# Patient Record
Sex: Male | Born: 1935 | Race: White | Hispanic: No | Marital: Married | Smoking: Former smoker
Health system: Southern US, Community
[De-identification: ages and names within clinical notes are randomized; demographics above are authoritative.]

## PROBLEM LIST (undated history)

## (undated) DIAGNOSIS — I10 Essential (primary) hypertension: Secondary | ICD-10-CM

## (undated) DIAGNOSIS — J8410 Pulmonary fibrosis, unspecified: Secondary | ICD-10-CM

## (undated) DIAGNOSIS — K5730 Diverticulosis of large intestine without perforation or abscess without bleeding: Secondary | ICD-10-CM

## (undated) DIAGNOSIS — K635 Polyp of colon: Secondary | ICD-10-CM

## (undated) DIAGNOSIS — D649 Anemia, unspecified: Secondary | ICD-10-CM

## (undated) DIAGNOSIS — R252 Cramp and spasm: Secondary | ICD-10-CM

## (undated) DIAGNOSIS — E119 Type 2 diabetes mellitus without complications: Secondary | ICD-10-CM

## (undated) DIAGNOSIS — K219 Gastro-esophageal reflux disease without esophagitis: Secondary | ICD-10-CM

## (undated) DIAGNOSIS — R0600 Dyspnea, unspecified: Secondary | ICD-10-CM

## (undated) DIAGNOSIS — I499 Cardiac arrhythmia, unspecified: Secondary | ICD-10-CM

## (undated) DIAGNOSIS — M1990 Unspecified osteoarthritis, unspecified site: Secondary | ICD-10-CM

## (undated) DIAGNOSIS — R059 Cough, unspecified: Secondary | ICD-10-CM

## (undated) DIAGNOSIS — R062 Wheezing: Secondary | ICD-10-CM

## (undated) DIAGNOSIS — N529 Male erectile dysfunction, unspecified: Secondary | ICD-10-CM

## (undated) DIAGNOSIS — R05 Cough: Secondary | ICD-10-CM

## (undated) DIAGNOSIS — E785 Hyperlipidemia, unspecified: Secondary | ICD-10-CM

## (undated) HISTORY — DX: Cramp and spasm: R25.2

## (undated) HISTORY — DX: Diverticulosis of large intestine without perforation or abscess without bleeding: K57.30

## (undated) HISTORY — PX: ESOPHAGOGASTRODUODENOSCOPY ENDOSCOPY: SHX5814

## (undated) HISTORY — DX: Anemia, unspecified: D64.9

## (undated) HISTORY — DX: Male erectile dysfunction, unspecified: N52.9

## (undated) HISTORY — DX: Hyperlipidemia, unspecified: E78.5

## (undated) HISTORY — PX: CYST EXCISION: SHX5701

## (undated) HISTORY — DX: Gastro-esophageal reflux disease without esophagitis: K21.9

## (undated) HISTORY — PX: COLONOSCOPY: SHX174

## (undated) HISTORY — PX: APPENDECTOMY: SHX54

## (undated) HISTORY — DX: Essential (primary) hypertension: I10

## (undated) HISTORY — PX: EYE SURGERY: SHX253

## (undated) HISTORY — PX: TONSILLECTOMY: SUR1361

## (undated) HISTORY — DX: Polyp of colon: K63.5

---

## 2006-03-29 LAB — PULMONARY FUNCTION TEST

## 2006-04-20 LAB — PULMONARY FUNCTION TEST

## 2006-09-06 LAB — PULMONARY FUNCTION TEST

## 2007-01-08 LAB — PULMONARY FUNCTION TEST

## 2007-04-09 LAB — PULMONARY FUNCTION TEST

## 2007-07-09 LAB — PULMONARY FUNCTION TEST

## 2009-01-01 DIAGNOSIS — E1121 Type 2 diabetes mellitus with diabetic nephropathy: Secondary | ICD-10-CM | POA: Insufficient documentation

## 2009-01-01 DIAGNOSIS — I10 Essential (primary) hypertension: Secondary | ICD-10-CM | POA: Insufficient documentation

## 2009-01-01 DIAGNOSIS — N400 Enlarged prostate without lower urinary tract symptoms: Secondary | ICD-10-CM | POA: Insufficient documentation

## 2009-01-01 DIAGNOSIS — D649 Anemia, unspecified: Secondary | ICD-10-CM | POA: Insufficient documentation

## 2009-03-22 DIAGNOSIS — K219 Gastro-esophageal reflux disease without esophagitis: Secondary | ICD-10-CM | POA: Insufficient documentation

## 2009-03-22 DIAGNOSIS — J45909 Unspecified asthma, uncomplicated: Secondary | ICD-10-CM | POA: Insufficient documentation

## 2009-03-22 DIAGNOSIS — N529 Male erectile dysfunction, unspecified: Secondary | ICD-10-CM | POA: Insufficient documentation

## 2009-03-22 DIAGNOSIS — M109 Gout, unspecified: Secondary | ICD-10-CM | POA: Insufficient documentation

## 2010-01-13 ENCOUNTER — Ambulatory Visit: Payer: Self-pay

## 2010-01-28 ENCOUNTER — Ambulatory Visit: Payer: Self-pay | Admitting: Surgery

## 2010-07-19 ENCOUNTER — Ambulatory Visit: Payer: Self-pay | Admitting: Surgery

## 2010-12-19 ENCOUNTER — Ambulatory Visit: Payer: Self-pay | Admitting: Surgery

## 2011-05-30 ENCOUNTER — Ambulatory Visit: Payer: Self-pay | Admitting: Family Medicine

## 2011-07-14 ENCOUNTER — Encounter: Payer: Self-pay | Admitting: Pulmonary Disease

## 2011-07-17 ENCOUNTER — Ambulatory Visit (INDEPENDENT_AMBULATORY_CARE_PROVIDER_SITE_OTHER): Payer: Medicare Other | Admitting: Pulmonary Disease

## 2011-07-17 DIAGNOSIS — R0602 Shortness of breath: Secondary | ICD-10-CM

## 2011-07-18 NOTE — Progress Notes (Signed)
  Subjective:    Patient ID: Jeremiah Gibson, male    DOB: 01/30/1936, 76 y.o.   MRN: [id number]  HPI  No show  Review of Systems     Objective:   Physical Exam        Assessment & Plan:

## 2011-07-31 ENCOUNTER — Encounter: Payer: Self-pay | Admitting: Pulmonary Disease

## 2011-07-31 ENCOUNTER — Ambulatory Visit (INDEPENDENT_AMBULATORY_CARE_PROVIDER_SITE_OTHER): Payer: Medicare Other | Admitting: Pulmonary Disease

## 2011-07-31 VITALS — BP 122/72 | HR 94 | Temp 97.8°F | Ht 71.0 in | Wt 227.2 lb

## 2011-07-31 DIAGNOSIS — R059 Cough, unspecified: Secondary | ICD-10-CM

## 2011-07-31 DIAGNOSIS — R05 Cough: Secondary | ICD-10-CM | POA: Insufficient documentation

## 2011-07-31 MED ORDER — CHLORPHENIRAMINE MALEATE 4 MG PO TABS: ORAL_TABLET | ORAL | Status: DC

## 2011-07-31 MED ORDER — BENZONATATE 100 MG PO CAPS: 200.0000 mg | ORAL_CAPSULE | Freq: Two times a day (BID) | ORAL | Status: AC | PRN

## 2011-07-31 MED ORDER — PSEUDOEPHEDRINE HCL 60 MG PO TABS: ORAL_TABLET | ORAL | Status: DC

## 2011-07-31 NOTE — Assessment & Plan Note (Signed)
COugh variant asthma - pos BD response 6/08 DD incl GERD/ allergies & ILD (bibasal crackles on exam) Take chlorpheniramine 4mg  twice daily  X 3 weeks - this may make you sleepy Take sudafed 60 mg once daily x 3 wks - check your Bp every 3 days & call if higher by 10 points Stay on prilosec & pepcid regimen for reflux Take DESLYM cough syrup 2 tsp thrice daily as needed for cough Take tessalon cough  perles 200 mg thrice daily as needed If no improvement in 3 weeks, consider HRCT for ILD Stay on advair bid  - may have to add singualir & pursue RAST

## 2011-07-31 NOTE — Patient Instructions (Signed)
Your cough maybe related to allergies or reflux - you have crackles at the base of your lungs which may be related to scar tissue Obtain CXR from dr Katrinka Blazing to review Take chlorpheniramine 4mg  twice daily  X 3 weeks - this may make you sleepy Take sudafed 60 mg once daily x 3 wks - check your Bp every 3 days & call if higher by 10 points Stay on prilosec & pepcid regimen for reflux Take DESLYM cough syrup 2 tsp thrice daily as needed for cough Take tessalon cough  perles 200 mg thrice daily as needed

## 2011-07-31 NOTE — Progress Notes (Signed)
Subjective:    Patient ID: Jeremiah Gibson, male    DOB: 1935-11-18, 76 y.o.   MRN: [id number]  HPI PCP - Helayne Seminole 76 y.o remote ex smoker referred for evaluation of cough x 3 weeks He has moved to [place] from [place], [place]  In 2010. He was evaluated by Pulmonology in [place] in 2008 for cough & wheezing & diagnosed with asthma. Review of his spirometry records essentially show mild restriction with FEV1 in the 72% range and normal FEV1 ratio. However spirometry from 6/08 does show significant bronchodilator response which could be consistent with some reversible disease. On this basis I presume he has been maintained on a regimen of Advair 50/50 twice a day and albuterol as needed for rescue . Allergy testing was negative.  Chest x-ray and [place] from 05/30/2011 is reported as clear lungs. He also has diabetes hyperlipidemia and hypertension. He smoked from his teenage years upto age  53. He worked in the [employer]. And was 6 clots from the [facility] on 9/11.  Past Medical History  Diagnosis Date  . Anemia   . Hypertension   . Diverticulosis of colon (without mention of hemorrhage)   . Leg cramps   . Colon polyps   . Type II or unspecified type diabetes mellitus without mention of complication, not stated as uncontrolled   . Gout   . Impotence of organic origin   . Esophageal reflux   . Asthma     No past surgical history on file.  No Known Allergies  History   Social History  . Marital Status: Single    Spouse Name: N/A    Number of Children: 2  . Years of Education: N/A   Occupational History  . Retired in 2004    Social History Main Topics  . Smoking status: Former Smoker -- 2.0 packs/day for 20 years    Types: Cigarettes, Pipe    Quit date: 03/14/1999  . Smokeless tobacco: Never Used   Comment: quit cigs in 72 and pipe in 01  . Alcohol Use: Yes     socially  . Drug Use: No  . Sexually Active: Not on file   Other Topics Concern  . Not on  file   Social History Narrative  . No narrative on file      Review of Systems  Constitutional: Negative for fever and unexpected weight change.  HENT: Positive for congestion, rhinorrhea, sneezing and postnasal drip. Negative for ear pain, nosebleeds, sore throat, trouble swallowing, dental problem and sinus pressure.   Eyes: Negative for redness and itching.  Respiratory: Positive for cough and wheezing. Negative for chest tightness and shortness of breath.   Cardiovascular: Negative for palpitations and leg swelling.  Gastrointestinal: Negative for nausea and vomiting.  Genitourinary: Negative for dysuria.  Musculoskeletal: Negative for joint swelling.  Skin: Negative for rash.  Neurological: Negative for headaches.  Hematological: Does not bruise/bleed easily.  Psychiatric/Behavioral: Negative for dysphoric mood. The patient is not nervous/anxious.        Objective:   Physical Exam  Gen. Pleasant, well-nourished, in no distress, normal affect ENT - no lesions, no post nasal drip Neck: No JVD, no thyromegaly, no carotid bruits Lungs: no use of accessory muscles, no dullness to percussion, bibasal rales 1/3, no rhonchi  Cardiovascular: Rhythm regular, heart sounds  normal, no murmurs or gallops, no peripheral edema Abdomen: soft and non-tender, no hepatosplenomegaly, BS normal. Musculoskeletal: No deformities, no cyanosis or clubbing Neuro:  alert, non  focal       Assessment & Plan:

## 2011-08-18 ENCOUNTER — Other Ambulatory Visit (INDEPENDENT_AMBULATORY_CARE_PROVIDER_SITE_OTHER): Payer: Medicare Other

## 2011-08-18 ENCOUNTER — Ambulatory Visit (INDEPENDENT_AMBULATORY_CARE_PROVIDER_SITE_OTHER): Payer: Medicare Other | Admitting: Pulmonary Disease

## 2011-08-18 ENCOUNTER — Encounter: Payer: Self-pay | Admitting: Pulmonary Disease

## 2011-08-18 VITALS — BP 112/62 | HR 60 | Temp 97.3°F | Ht 71.0 in | Wt 221.2 lb

## 2011-08-18 DIAGNOSIS — R059 Cough, unspecified: Secondary | ICD-10-CM

## 2011-08-18 DIAGNOSIS — R05 Cough: Secondary | ICD-10-CM

## 2011-08-18 LAB — CBC WITH DIFFERENTIAL/PLATELET
Eosinophils Relative: 2.4 % (ref 0.0–5.0)
Lymphocytes Relative: 23.4 % (ref 12.0–46.0)
MCV: 93.4 fl (ref 78.0–100.0)
Monocytes Absolute: 0.6 10*3/uL (ref 0.1–1.0)
Monocytes Relative: 7.3 % (ref 3.0–12.0)
Neutrophils Relative %: 66.6 % (ref 43.0–77.0)
Platelets: 197 10*3/uL (ref 150.0–400.0)
RBC: 3.83 Mil/uL — ABNORMAL LOW (ref 4.22–5.81)
WBC: 7.6 10*3/uL (ref 4.5–10.5)

## 2011-08-18 MED ORDER — PREDNISONE 10 MG PO TABS: ORAL_TABLET | ORAL | Status: DC

## 2011-08-18 NOTE — Patient Instructions (Signed)
Ct scan of chest for scar tissue (fibrosis) Blood work for allergies Stop antihistaminic & sudafed Short course of prednisone - 20 mg daily x 1 week, then 10 mg x 1 week then stop Check your sugars every 3 days & call me if > 180 Trial of dexilant - instead of omeprazoel , ok to stay on pepcid

## 2011-08-18 NOTE — Assessment & Plan Note (Addendum)
COugh variant asthma - pos BD response 6/08,  DD incl GERD/ allergies & ILD (bibasal crackles on exam) 5/13-50% better on empiric Rx for upper airway cough  High res Ct scan of chest for scar tissue (fibrosis) Blood work for allergies -RAST, CBC for eos Stop antihistaminic & sudafed Short course of prednisone - 20 mg daily x 1 week, then 10 mg x 1 week then stop Check your sugars every 3 days & call me if > 180 Trial of dexilant - instead of omeprazole - samples given , ok to stay on pepcid

## 2011-08-18 NOTE — Progress Notes (Signed)
  Subjective:    Patient ID: Jeremiah Gibson, male    DOB: [birth year], 76 y.o.   MRN: [id number]  HPI PCP - Helayne Seminole  76 y.o remote ex smoker referred for evaluation of cough x 3 weeks  He has moved to [place] from [place], [place] In 2010. He was evaluated by Pulmonology in [place] in 2008 for cough & wheezing & diagnosed with asthma. Review of his spirometry records essentially show mild restriction with FEV1 in the 72% range and normal FEV1 ratio. However spirometry from 6/08 does show significant bronchodilator response which could be consistent with some reversible disease. On this basis I presume he has been maintained on a regimen of Advair 50/50 twice a day and albuterol as needed for rescue . Allergy testing was negative.  Chest x-ray and [place] from 05/30/2011 is reported as clear lungs.  He also has diabetes hyperlipidemia and hypertension.  He smoked from his teenage years upto age 14. He worked in the [employer]. And was 6 blocks from the [facility] on 9/11.   >> COugh variant asthma - pos BD response 6/08  DD incl GERD/ allergies & ILD (bibasal crackles on exam)   08/18/2011 pt states some improvement-mostly coughing at night. pt c/o dry cough and wheezing. pt denies any chest pain/tightness. He did not take CPM, sudafed made his bp run high- hence stopped Reflux controlled on  prilosec & pepcid regimen DESLYM cough syrup & tessalon cough perles helped - cough about 60% better  Review of Systems    Patient denies significant dyspnea,cough, hemoptysis,  chest pain, palpitations, pedal edema, orthopnea, paroxysmal nocturnal dyspnea, lightheadedness, nausea, vomiting, abdominal or  leg pains   Objective:   Physical Exam  Gen. Pleasant, well-nourished, in no distress ENT - no lesions, no post nasal drip Neck: No JVD, no thyromegaly, no carotid bruits Lungs: no use of accessory muscles, no dullness to percussion,bibasal rales , no rhonchi  Cardiovascular: Rhythm  regular, heart sounds  normal, no murmurs or gallops, no peripheral edema Musculoskeletal: No deformities, no cyanosis or clubbing        Assessment & Plan:

## 2011-08-21 LAB — ALLERGY FULL PROFILE
Allergen,Goose feathers, e70: <0.1 kU/L (ref <0.35)
Alternaria Alternata: <0.1 kU/L (ref <0.35)
Aspergillus fumigatus, IgG: <0.1 kU/L (ref <0.35)
Bermuda Grass: <0.1 kU/L (ref <0.35)
Candida Albicans: <0.1 kU/L (ref <0.35)
Cat Dander: <0.1 kU/L (ref <0.35)
D. farinae: <0.1 kU/L (ref <0.35)
IgE (Immunoglobulin E), Serum: 6.3 IU/mL (ref 0.0–180.0)
Lamb's Quarters: <0.1 kU/L (ref <0.35)
Plantain: <0.1 kU/L (ref <0.35)
Stemphylium Botryosum: <0.1 kU/L (ref <0.35)

## 2011-08-22 ENCOUNTER — Ambulatory Visit (INDEPENDENT_AMBULATORY_CARE_PROVIDER_SITE_OTHER)
Admission: RE | Admit: 2011-08-22 | Discharge: 2011-08-22 | Disposition: A | Discharge status: Home / Self Care | Payer: Medicare Other | Source: Ambulatory Visit | Attending: Pulmonary Disease | Admitting: Pulmonary Disease

## 2011-08-22 ENCOUNTER — Telehealth: Payer: Self-pay | Admitting: Pulmonary Disease

## 2011-08-22 DIAGNOSIS — R05 Cough: Secondary | ICD-10-CM

## 2011-08-22 DIAGNOSIS — R059 Cough, unspecified: Secondary | ICD-10-CM

## 2011-08-22 NOTE — Telephone Encounter (Signed)
I spoke with patient about results and he verbalized understanding and had no questions 

## 2011-08-24 ENCOUNTER — Institutional Professional Consult (permissible substitution): Payer: Self-pay | Admitting: Pulmonary Disease

## 2011-10-03 ENCOUNTER — Telehealth: Payer: Self-pay | Admitting: Pulmonary Disease

## 2011-10-03 ENCOUNTER — Ambulatory Visit (INDEPENDENT_AMBULATORY_CARE_PROVIDER_SITE_OTHER): Payer: Medicare Other | Admitting: Pulmonary Disease

## 2011-10-03 ENCOUNTER — Encounter: Payer: Self-pay | Admitting: Pulmonary Disease

## 2011-10-03 VITALS — BP 122/82 | HR 76 | Temp 98.3°F | Ht 71.0 in | Wt 223.0 lb

## 2011-10-03 DIAGNOSIS — R059 Cough, unspecified: Secondary | ICD-10-CM

## 2011-10-03 DIAGNOSIS — J84112 Idiopathic pulmonary fibrosis: Secondary | ICD-10-CM | POA: Insufficient documentation

## 2011-10-03 DIAGNOSIS — J8410 Pulmonary fibrosis, unspecified: Secondary | ICD-10-CM

## 2011-10-03 DIAGNOSIS — R05 Cough: Secondary | ICD-10-CM

## 2011-10-03 DIAGNOSIS — J849 Interstitial pulmonary disease, unspecified: Secondary | ICD-10-CM

## 2011-10-03 LAB — PULMONARY FUNCTION TEST

## 2011-10-03 NOTE — Assessment & Plan Note (Signed)
COugh variant asthma - pos BD response 6/08 DD incl GERD/ allergies & ILD  7/13-improved with dexilant & prednisone Ct omeprazole + pepcid since he prefers generics

## 2011-10-03 NOTE — Progress Notes (Signed)
PFT done today. 

## 2011-10-03 NOTE — Progress Notes (Signed)
  Subjective:    Patient ID: Jeremiah Gibson, male    DOB: 08/18/1935, 76 y.o.   MRN: [id number]  HPI PCP - Helayne Seminole  76 y.o remote ex smoker  for FU of cough x 3 weeks  He has moved to [place] from [place], [place] In 2010. He was evaluated by Pulmonology in [place] in 2008 for cough & wheezing & diagnosed with asthma. Review of his spirometry records essentially show mild restriction with FEV1 in the 72% range and normal FEV1 ratio. However spirometry from 6/08 does show significant bronchodilator response which could be consistent with some reversible disease. On this basis, he has been maintained on a regimen of Advair 50/50 twice a day and albuterol as needed for rescue . Allergy testing was negative.  Chest x-ray and [place] from 05/30/2011 is reported as clear lungs.  He also has diabetes hyperlipidemia and hypertension.  He smoked from his teenage years upto age 24. He worked in the [employer]. And was 6 blocks from the [facility] on 9/11.  >> COugh variant asthma - pos BD response 6/08  DD incl GERD/ allergies & ILD (bibasal crackles on exam)  He did not take CPM, sudafed made his bp run high- hence stopped  Reflux controlled on prilosec & pepcid regimen  DESLYM cough syrup & tessalon cough perles helped - cough about 60% better  10/03/2011 Cough 90% improved HR Ct >>Mild subpleural reticulation/fibrosis, nonspecific. No definite  honeycombing to suggest idiopathic pulmonary fibrosis.  Mild bilateral lower lobe bronchiectasis RAST -neg  Short course of prednisone - helped Trial of dexilant - seemed to be better than omeprazole , but he prefers generics. PFTs - no airway obstruction, nml lung volumes, DLCO 64%     Review of Systems neg for any significant sore throat, dysphagia, itching, sneezing, nasal congestion or excess/ purulent secretions, fever, chills, sweats, unintended wt loss, pleuritic or exertional cp, hempoptysis, orthopnea pnd or change in chronic leg  swelling. Also denies presyncope, palpitations, abdominal pain, nausea, vomiting, diarrhea or change in bowel or urinary habits, dysuria,hematuria, rash, arthralgias, visual complaints, headache, numbness weakness or ataxia.     Objective:   Physical Exam  Gen. Pleasant, well-nourished, in no distress ENT - no lesions, no post nasal drip Neck: No JVD, no thyromegaly, no carotid bruits Lungs: no use of accessory muscles, no dullness to percussion, bibasal rales, no rhonchi  Cardiovascular: Rhythm regular, heart sounds  normal, no murmurs or gallops, no peripheral edema Musculoskeletal: No deformities, no cyanosis or clubbing         Assessment & Plan:

## 2011-10-03 NOTE — Assessment & Plan Note (Signed)
DD incl IPF (atypical) vs hypersensitivity pneumonitis Briefly discussed implications of diagnosis, defer biopsy for now, no obvous drug toxicity

## 2011-10-03 NOTE — Telephone Encounter (Signed)
Lung function appears OK. Lung capacities are wnl Diffusion is decreased at 64% which establishes baseline - we can follow this in 6months

## 2011-10-03 NOTE — Patient Instructions (Signed)
Schedule PFTs  - we will call you with results You have scar tissue in your lungs- may indicate the beginning of pulmonary fibrosis, we discussed the need for biopsy to make a definitive diagnosis OK to stop prednisone & dexilant Go back to omeprazole & pepcid

## 2011-10-04 NOTE — Telephone Encounter (Signed)
[id number] 

## 2011-10-05 NOTE — Telephone Encounter (Signed)
Patient returning Mindy's call. °

## 2011-10-05 NOTE — Telephone Encounter (Signed)
Called and spoke with patient, advised him of Dr. Carlena Sax results and recs as listed below.  Patient varbalized understanding and had no further questions or concerns at this time.

## 2011-10-09 ENCOUNTER — Encounter: Payer: Self-pay | Admitting: Pulmonary Disease

## 2012-02-16 ENCOUNTER — Ambulatory Visit: Payer: Self-pay | Admitting: Gastroenterology

## 2012-02-20 LAB — PATHOLOGY REPORT

## 2012-04-15 ENCOUNTER — Encounter: Payer: Self-pay | Admitting: Pulmonary Disease

## 2012-04-15 ENCOUNTER — Ambulatory Visit (INDEPENDENT_AMBULATORY_CARE_PROVIDER_SITE_OTHER): Payer: Medicare Other | Admitting: Pulmonary Disease

## 2012-04-15 VITALS — BP 122/72 | HR 78 | Temp 96.8°F | Ht 71.0 in | Wt 227.0 lb

## 2012-04-15 DIAGNOSIS — R059 Cough, unspecified: Secondary | ICD-10-CM

## 2012-04-15 DIAGNOSIS — J849 Interstitial pulmonary disease, unspecified: Secondary | ICD-10-CM

## 2012-04-15 DIAGNOSIS — J8410 Pulmonary fibrosis, unspecified: Secondary | ICD-10-CM

## 2012-04-15 DIAGNOSIS — R05 Cough: Secondary | ICD-10-CM

## 2012-04-15 NOTE — Patient Instructions (Signed)
CXR & breathing test in 

## 2012-04-15 NOTE — Progress Notes (Signed)
  Subjective:    Patient ID: Jeremiah Gibson, male    DOB: 02/18/1936, 77 y.o.   MRN: [id number]  HPI  PCP - Helayne Seminole   77 y.o remote ex smoker  for FU of cough & ILD  He has moved to [place] from [place], [place] In 2010. He was evaluated by Pulmonology in [place] in 2008 for cough & wheezing & diagnosed with asthma. Review of his spirometry records essentially show mild restriction with FEV1 in the 72% range and normal FEV1 ratio. However spirometry from 6/08 does show significant bronchodilator response which could be consistent with some reversible disease. On this basis, he has been maintained on a regimen of Advair 50/50 twice a day and albuterol as needed for rescue . Allergy testing was negative.  Chest x-ray and  from 05/30/2011 is reported as clear lungs.  He also has diabetes hyperlipidemia and hypertension.  He smoked from his teenage years upto age 20. He worked in the [employer]. And was 6 blocks from the [facility] on 9/11.  >> COugh variant asthma - pos BD response 6/08  DD incl GERD/ allergies & ILD (bibasal crackles on exam)  He did not take CPM, sudafed made his bp run high- hence stopped  Reflux controlled on prilosec & pepcid regimen  DESLYM cough syrup & tessalon cough perles helped - cough about 60% better  HR Ct >>Mild subpleural reticulation/fibrosis, nonspecific. No definite honeycombing to suggest idiopathic pulmonary fibrosis. Mild bilateral lower lobe bronchiectasis RAST -neg  Short course of prednisone - helped Trial of dexilant - seemed to be better than omeprazole , but he prefers generics. PFTs 7/13 - no airway obstruction, nml lung volumes, DLCO 64%  04/15/2012  Intermittent cough & wheeze  - compliant with advair Dyspnea is stable No sputum, pedal edema  Review of Systems neg for any significant sore throat, dysphagia, itching, sneezing, nasal congestion or excess/ purulent secretions, fever, chills, sweats, unintended wt loss,  pleuritic or exertional cp, hempoptysis, orthopnea pnd or change in chronic leg swelling. Also denies presyncope, palpitations, heartburn, abdominal pain, nausea, vomiting, diarrhea or change in bowel or urinary habits, dysuria,hematuria, rash, arthralgias, visual complaints, headache, numbness weakness or ataxia.     Objective:   Physical Exam  Gen. Pleasant, well-nourished, in no distress ENT - no lesions, no post nasal drip Neck: No JVD, no thyromegaly, no carotid bruits Lungs: no use of accessory muscles, no dullness to percussion, bibasal rales, no rhonchi  Cardiovascular: Rhythm regular, heart sounds  normal, no murmurs or gallops, no peripheral edema Musculoskeletal: No deformities, no cyanosis or clubbing         Assessment & Plan:

## 2012-04-15 NOTE — Assessment & Plan Note (Signed)
Stable by symptoms 1 y FU of CXR & PFTs next visit

## 2012-04-15 NOTE — Assessment & Plan Note (Signed)
COugh variant asthma - pos BD response 6/08 DD incl GERD/ allergies & ILD  7/13-improved with dexilant & prednisone

## 2012-08-26 ENCOUNTER — Ambulatory Visit: Payer: Self-pay

## 2012-08-26 ENCOUNTER — Other Ambulatory Visit: Payer: Self-pay | Admitting: Occupational Medicine

## 2012-08-26 DIAGNOSIS — Z0000 Encounter for general adult medical examination without abnormal findings: Secondary | ICD-10-CM

## 2012-08-26 LAB — PULMONARY FUNCTION TEST

## 2012-11-25 ENCOUNTER — Ambulatory Visit (INDEPENDENT_AMBULATORY_CARE_PROVIDER_SITE_OTHER): Payer: Medicare Other | Admitting: Pulmonary Disease

## 2012-11-25 ENCOUNTER — Encounter: Payer: Self-pay | Admitting: Pulmonary Disease

## 2012-11-25 VITALS — BP 110/62 | HR 96 | Ht 69.0 in | Wt 220.0 lb

## 2012-11-25 DIAGNOSIS — R059 Cough, unspecified: Secondary | ICD-10-CM

## 2012-11-25 DIAGNOSIS — J849 Interstitial pulmonary disease, unspecified: Secondary | ICD-10-CM

## 2012-11-25 DIAGNOSIS — J8410 Pulmonary fibrosis, unspecified: Secondary | ICD-10-CM

## 2012-11-25 DIAGNOSIS — Z23 Encounter for immunization: Secondary | ICD-10-CM

## 2012-11-25 DIAGNOSIS — R05 Cough: Secondary | ICD-10-CM

## 2012-11-25 NOTE — Assessment & Plan Note (Signed)
Ct advair for now Consider prednisone if flare

## 2012-11-25 NOTE — Patient Instructions (Addendum)
Slight drop in lung function - about 5% Stay on advair We discussed medication for pulmonary fibrosis Flu shot

## 2012-11-25 NOTE — Progress Notes (Signed)
  Subjective:    Patient ID: Jeremiah Gibson, male    DOB: January 27, 1936, 77 y.o.   MRN: [id number]  HPI  PCP - Helayne Seminole   77 y.o remote ex smoker for FU of cough variant asthma  & ILD  He has moved to [place] from [place], [place] In 2010. He was evaluated by Pulmonology in [place] in 2008 for cough & wheezing & diagnosed with asthma. Review of his spirometry records essentially show mild restriction with FEV1 in the 72% range and normal FEV1 ratio. However spirometry from 6/08 does show significant bronchodilator response which could be consistent with some reversible disease. On this basis, he has been maintained on a regimen of Advair 50/50 twice a day and albuterol as needed for rescue . Allergy testing was negative.  Chest x-ray at [facility] from 05/30/2011 is reported as clear lungs.  He also has diabetes hyperlipidemia and hypertension.  He smoked from his teenage years upto age 72. He worked in the [employer] and was 6 blocks from the [facility] [employer] on 9/11.  He was empirically treated for upper airway cough & GERD With  60% improvment HRCT >>Mild subpleural reticulation/fibrosis, nonspecific. No definite honeycombing . Mild bilateral lower lobe bronchiectasis  RAST -neg  Short course of prednisone - helped  Trial of dexilant - seemed to be better than omeprazole , but he prefers generics.  PFTs 7/13 - no airway obstruction, nml lung volumes, DLCO 64%     11/25/2012 9m FU   Pt reports his seems to be having a slight dry cough after dinner. Denies any wheezing. no chest tx. Breathing has unchanged. No PND, no congestion.  He underwent evaluation for [facility] cough  - labs ok, CXR clear, spirometry  -no obstruction PFTs -slight drop in FVC from 3.8 to 3.5, DLCO increased to 17.9 (57%), TLC dropped to 4.95(76%)  He would like generic instead of advair  Past Medical History  Diagnosis Date  . Anemia   . Hypertension   . Diverticulosis of colon (without mention of hemorrhage)    . Leg cramps   . Colon polyps   . Type II or unspecified type diabetes mellitus without mention of complication, not stated as uncontrolled   . Gout   . Impotence of organic origin   . Esophageal reflux   . Asthma      Review of Systems neg for any significant sore throat, dysphagia, itching, sneezing, nasal congestion or excess/ purulent secretions, fever, chills, sweats, unintended wt loss, pleuritic or exertional cp, hempoptysis, orthopnea pnd or change in chronic leg swelling. Also denies presyncope, palpitations, heartburn, abdominal pain, nausea, vomiting, diarrhea or change in bowel or urinary habits, dysuria,hematuria, rash, arthralgias, visual complaints, headache, numbness weakness or ataxia.     Objective:   Physical Exam  Gen. Pleasant, well-nourished, in no distress, normal affect ENT - no lesions, no post nasal drip Neck: No JVD, no thyromegaly, no carotid bruits Lungs: no use of accessory muscles, no dullness to percussion, bibasal 1/3 rales, no rhonchi  Cardiovascular: Rhythm regular, heart sounds  normal, no murmurs or gallops, no peripheral edema Abdomen: soft and non-tender, no hepatosplenomegaly, BS normal. Musculoskeletal: No deformities, no cyanosis or clubbing Neuro:  alert, non focal       Assessment & Plan:

## 2012-11-25 NOTE — Assessment & Plan Note (Addendum)
About 5% drop in FVC but DLCO improved, symptomatically stable We discussed medication for pulmonary fibrosis - he does not want to participate in 'research' Flu shot

## 2012-11-25 NOTE — Progress Notes (Signed)
PFT done today. 

## 2012-11-27 ENCOUNTER — Encounter: Payer: Self-pay | Admitting: Cardiovascular Disease

## 2012-11-27 ENCOUNTER — Ambulatory Visit (INDEPENDENT_AMBULATORY_CARE_PROVIDER_SITE_OTHER): Payer: Medicare Other | Admitting: Cardiovascular Disease

## 2012-11-27 VITALS — BP 130/78 | HR 79 | Ht 69.0 in | Wt 221.5 lb

## 2012-11-27 DIAGNOSIS — I2510 Atherosclerotic heart disease of native coronary artery without angina pectoris: Secondary | ICD-10-CM

## 2012-11-27 DIAGNOSIS — I700 Atherosclerosis of aorta: Secondary | ICD-10-CM | POA: Insufficient documentation

## 2012-11-27 DIAGNOSIS — E785 Hyperlipidemia, unspecified: Secondary | ICD-10-CM

## 2012-11-27 DIAGNOSIS — I10 Essential (primary) hypertension: Secondary | ICD-10-CM

## 2012-11-27 DIAGNOSIS — R0602 Shortness of breath: Secondary | ICD-10-CM

## 2012-11-27 NOTE — Patient Instructions (Addendum)
You are doing well. No medication changes were made.  Please call us if you have new issues that need to be addressed before your next appt.  Your physician wants you to follow-up in: 12 months.  You will receive a reminder letter in the mail two months in advance. If you don't receive a letter, please call our office to schedule the follow-up appointment. 

## 2012-11-27 NOTE — Assessment & Plan Note (Signed)
Suggested he stay on simvastatin 10 mg daily with goal LDL less than 70

## 2012-11-27 NOTE — Assessment & Plan Note (Signed)
Mild atherosclerosis based on CT scan. No further workup needed at this time. Encouraged him to watch his diet, exercise, stay on his cholesterol medication

## 2012-11-27 NOTE — Progress Notes (Signed)
Patient ID: Jeremiah Gibson, male    DOB: [birth year], 77 y.o.   MRN: [id number]  HPI Comments: Jeremiah Gibson is a very pleasant 77 year old gentleman with long history of diabetes, remote history of smoking, hyperlipidemia, hypertension who presents for new patient evaluation for cardiomegaly, aortic atherosclerosis.  He reports that in general he is doing well. His wife does report that he has significant shortness of breath with exertion but has been getting worse over the past several years. He has been evaluated by pulmonary for his shortness of breath and cough and reports doing better on his inhalers. Diagnosis of variant asthma and ILD.   CT scan of the chest was performed earlier this year that shows changes in the bases of his lungs also with normal size heart, coronary artery disease, mild aortic arch atherosclerosis. Chest x-ray and followup 08/26/2012 suggested mild cardiomegaly, noted aortic atherosclerosis appeared unchanged.  Reports that his diabetes is well controlled, hemoglobin A1c 6.6 Lab work from June 2014 showing total cholesterol 146, LDL 73, HDL 42 on Zocor 10 mg daily  He denies any significant chest pain, no lightheadedness or dizziness. Denies any lower extremity edema. Apart from shortness of breath, he feels well.  EKG shows normal sinus rhythm with rate 79 beats per minute with first degree AV block, no significant ST or T wave changes   Outpatient Encounter Prescriptions as of 11/27/2012  Medication Sig Dispense Refill  . albuterol (PROVENTIL HFA;VENTOLIN HFA) 108 (90 BASE) MCG/ACT inhaler Inhale 2 puffs into the lungs every 6 (six) hours as needed.      Marland Kitchen allopurinol (ZYLOPRIM) 100 MG tablet Take 200 mg by mouth 2 (two) times daily.      Marland Kitchen amLODipine (NORVASC) 5 MG tablet Take 5 mg by mouth daily.      Marland Kitchen aspirin 81 MG tablet Take 81 mg by mouth daily.      . famotidine (PEPCID) 10 MG tablet Take 10 mg by mouth daily.      . finasteride (PROSCAR) 5 MG tablet Take 5  mg by mouth daily.      . fish oil-omega-3 fatty acids 1000 MG capsule Take 1 g by mouth 3 (three) times daily.       . Fluticasone-Salmeterol (ADVAIR DISKUS) 250-50 MCG/DOSE AEPB Inhale 1 puff into the lungs 2 (two) times daily.      Marland Kitchen glipiZIDE (GLUCOTROL) 10 MG tablet Take 10 mg by mouth daily.      Marland Kitchen losartan (COZAAR) 100 MG tablet Take 100 mg by mouth daily.      . Magnesium Citrate POWD by Does not apply route daily.      . metformin (FORTAMET) 500 MG (OSM) 24 hr tablet Take 500 mg by mouth daily with breakfast.      . Multiple Vitamins-Minerals (MULTIVITAL PO) Take by mouth daily.      Marland Kitchen omeprazole (PRILOSEC) 20 MG capsule Take 20 mg by mouth daily.      . potassium chloride SA (K-DUR,KLOR-CON) 20 MEQ tablet Take 20 mEq by mouth daily.      . simvastatin (ZOCOR) 10 MG tablet Take 10 mg by mouth at bedtime.      Marland Kitchen terazosin (HYTRIN) 5 MG capsule Take 5 mg by mouth at bedtime.      . triamterene-hydrochlorothiazide (DYAZIDE) 37.5-25 MG per capsule Take 1 capsule by mouth every morning.      . [DISCONTINUED] Potassium 99 MG TABS Take by mouth daily.      . simvastatin (ZOCOR) 10  MG tablet Take 10 mg by mouth every evening.         Review of Systems  Constitutional: Negative.   HENT: Negative.   Eyes: Negative.   Respiratory: Negative.   Cardiovascular: Negative.   Gastrointestinal: Negative.   Endocrine: Negative.   Musculoskeletal: Negative.   Skin: Negative.   Allergic/Immunologic: Negative.   Neurological: Negative.   Hematological: Negative.   Psychiatric/Behavioral: Negative.   All other systems reviewed and are negative.    BP 130/78  Pulse 79  Ht 5\' 9"  (1.753 m)  Wt 221 lb 8 oz (100.472 kg)  BMI 32.69 kg/m2  Physical Exam  Nursing note and vitals reviewed. Constitutional: He is oriented to person, place, and time. He appears well-developed and well-nourished.  HENT:  Head: Normocephalic.  Nose: Nose normal.  Mouth/Throat: Oropharynx is clear and moist.   Eyes: Conjunctivae are normal. Pupils are equal, round, and reactive to light.  Neck: Normal range of motion. Neck supple. No JVD present.  Cardiovascular: Normal rate, regular rhythm, S1 normal, S2 normal, normal heart sounds and intact distal pulses.  Exam reveals no gallop and no friction rub.   No murmur heard. Pulmonary/Chest: Effort normal and breath sounds normal. No respiratory distress. He has no wheezes. He has no rales. He exhibits no tenderness.  Abdominal: Soft. Bowel sounds are normal. He exhibits no distension. There is no tenderness.  Musculoskeletal: Normal range of motion. He exhibits no edema and no tenderness.  Lymphadenopathy:    He has no cervical adenopathy.  Neurological: He is alert and oriented to person, place, and time. Coordination normal.  Skin: Skin is warm and dry. No rash noted. No erythema.  Psychiatric: He has a normal mood and affect. His behavior is normal. Judgment and thought content normal.      Assessment and Plan

## 2012-11-27 NOTE — Assessment & Plan Note (Signed)
Wife is concerned about his shortness of breath. Stress testing or catheterization would be an option if symptoms get worse or if they so choose. Suspect there is a component of deconditioning. Recommended he start on a regular exercise program.

## 2012-11-27 NOTE — Assessment & Plan Note (Addendum)
Recent CT scan suggesting coronary artery disease. Normal heart size on CT scan. Suggested he disregard the chest x-ray which raised the concern of mild cardiomegaly. Discussed his shortness of breath symptoms. Unable to exclude ischemia as a cause of his shortness of breath. He and his wife think that he is stable at this time. We did discuss various kinds of stress testing even cardiac catheterization. They prefer to hold off on any testing at this time and we'll call the office if symptoms get worse.  I feel he is very well managed from a medical point of view with good diabetes control, excellent cholesterol. Would aim for LDL less than 70

## 2013-01-02 ENCOUNTER — Encounter: Payer: Self-pay | Admitting: Pulmonary Disease

## 2013-06-25 LAB — PULMONARY FUNCTION TEST
DL/VA % pred: 80 %
DL/VA: 3.63 ml/min/mmHg/L
DLCO unc % pred: 57 %
DLCO unc: 17.91 ml/min/mmHg
FEF 25-75 Post: 4.02 L/sec
FEF 25-75 Pre: 3.5 L/sec
FEF2575-%Change-Post: 15 %
FEF2575-%Pred-Post: 188 %
FEF2575-%Pred-Pre: 164 %
FEV1-%Change-Post: 4 %
FEV1-%Pred-Post: 97 %
FEV1-%Pred-Pre: 93 %
FEV1-Post: 2.87 L
FEV1-Pre: 2.76 L
FEV1FVC-%Change-Post: -3 %
FEV1FVC-%Pred-Pre: 115 %
FEV6-%Change-Post: 7 %
FEV6-%Pred-Post: 92 %
FEV6-%Pred-Pre: 85 %
FEV6-Post: 3.52 L
FEV6-Pre: 3.27 L
FEV6FVC-%Pred-Post: 106 %
FEV6FVC-%Pred-Pre: 106 %
FVC-%Change-Post: 7 %
FVC-%Pred-Post: 86 %
FVC-%Pred-Pre: 80 %
FVC-Post: 3.52 L
Post FEV1/FVC ratio: 82 %
Post FEV6/FVC ratio: 100 %
Pre FEV1/FVC ratio: 85 %
Pre FEV6/FVC Ratio: 100 %
RV % pred: 76 %
RV: 1.91 L
TLC % pred: 72 %
TLC: 4.95 L

## 2014-03-16 DIAGNOSIS — H2513 Age-related nuclear cataract, bilateral: Secondary | ICD-10-CM | POA: Diagnosis not present

## 2014-06-10 DIAGNOSIS — E1140 Type 2 diabetes mellitus with diabetic neuropathy, unspecified: Secondary | ICD-10-CM | POA: Diagnosis not present

## 2014-06-10 DIAGNOSIS — D649 Anemia, unspecified: Secondary | ICD-10-CM | POA: Diagnosis not present

## 2014-06-10 DIAGNOSIS — E780 Pure hypercholesterolemia: Secondary | ICD-10-CM | POA: Diagnosis not present

## 2014-06-10 DIAGNOSIS — Z0000 Encounter for general adult medical examination without abnormal findings: Secondary | ICD-10-CM | POA: Diagnosis not present

## 2014-08-04 DIAGNOSIS — E291 Testicular hypofunction: Secondary | ICD-10-CM | POA: Insufficient documentation

## 2014-08-04 DIAGNOSIS — I517 Cardiomegaly: Secondary | ICD-10-CM | POA: Insufficient documentation

## 2014-08-04 DIAGNOSIS — K5790 Diverticulosis of intestine, part unspecified, without perforation or abscess without bleeding: Secondary | ICD-10-CM | POA: Insufficient documentation

## 2014-08-04 DIAGNOSIS — Z86010 Personal history of colonic polyps: Secondary | ICD-10-CM | POA: Insufficient documentation

## 2014-10-21 ENCOUNTER — Ambulatory Visit: Payer: Self-pay | Admitting: Family Medicine

## 2014-11-04 ENCOUNTER — Ambulatory Visit (INDEPENDENT_AMBULATORY_CARE_PROVIDER_SITE_OTHER): Payer: Medicare Other | Admitting: Family Medicine

## 2014-11-04 ENCOUNTER — Encounter: Payer: Self-pay | Admitting: Family Medicine

## 2014-11-04 VITALS — BP 128/64 | HR 80 | Temp 97.6°F | Resp 16 | Ht 70.5 in | Wt 219.0 lb

## 2014-11-04 DIAGNOSIS — I10 Essential (primary) hypertension: Secondary | ICD-10-CM

## 2014-11-04 DIAGNOSIS — E1140 Type 2 diabetes mellitus with diabetic neuropathy, unspecified: Secondary | ICD-10-CM

## 2014-11-04 LAB — POCT GLYCOSYLATED HEMOGLOBIN (HGB A1C)
Est. average glucose Bld gHb Est-mCnc: 174
HEMOGLOBIN A1C: 7.7

## 2014-11-04 MED ORDER — GLUCOSE BLOOD VI STRP: ORAL_STRIP | Status: DC

## 2014-11-04 MED ORDER — METFORMIN HCL ER (OSM) 1000 MG PO TB24: 1000.0000 mg | ORAL_TABLET | Freq: Every day | ORAL | Status: DC

## 2014-11-04 NOTE — Progress Notes (Signed)
Subjective:    Patient ID: Jeremiah Gibson, male    DOB: 1936-03-04, 79 y.o.   MRN: [id number]  Diabetes He presents for his follow-up (Last A1C 7.7% on 06/10/2014) diabetic visit. He has type 2 diabetes mellitus. There are no hypoglycemic associated symptoms. Pertinent negatives for hypoglycemia include no headaches or sweats. Associated symptoms include polydipsia and visual change. Pertinent negatives for diabetes include no blurred vision, no chest pain, no fatigue, no foot paresthesias, no foot ulcerations, no polyphagia, no polyuria, no weakness and no weight loss. Risk factors for coronary artery disease include dyslipidemia, diabetes mellitus, male sex and hypertension. Current diabetic treatment includes oral agent (dual therapy). He is compliant with treatment all of the time. His weight is stable. He is following a generally healthy diet. He participates in exercise weekly (Silver Sneakers). His home blood glucose trend is fluctuating minimally. His breakfast blood glucose range is generally 110-130 mg/dl. Eye exam is current.  Hypertension This is a chronic problem. The problem is controlled. Pertinent negatives include no anxiety, blurred vision, chest pain, headaches, malaise/fatigue, neck pain, orthopnea, palpitations, peripheral edema, shortness of breath or sweats. Treatments tried: Amlodipine 5mg , Losartan 100mg , Triamterene-HCTZ.      Review of Systems  Constitutional: Negative for weight loss, malaise/fatigue and fatigue.  Eyes: Negative for blurred vision.  Respiratory: Negative for shortness of breath.   Cardiovascular: Negative for chest pain, palpitations and orthopnea.  Endocrine: Positive for polydipsia. Negative for polyphagia and polyuria.  Musculoskeletal: Negative for neck pain.  Neurological: Negative for weakness and headaches.   BP 128/64 mmHg  Pulse 80  Temp(Src) 97.6 F (36.4 C) (Oral)  Resp 16  Ht 5' 10.5" (1.791 m)  Wt 219 lb (99.338 kg)  BMI 30.97  kg/m2   Patient Active Problem List   Diagnosis Date Noted  . Cardiac enlargement 08/04/2014  . DD (diverticular disease) 08/04/2014  . History of colon polyps 08/04/2014  . 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 Shortness of breath 11/27/2012  . Aortic arch atherosclerosis 11/27/2012  . Coronary artery disease 11/27/2012  . Hyperlipidemia 11/27/2012  . ILD (interstitial lung disease) 10/03/2011  . Cough 07/31/2011  . Airway hyperreactivity 03/22/2009  . Acid reflux 03/22/2009  . Gout 03/22/2009  . 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 Absolute anemia 01/01/2009  . Diabetes 01/01/2009  . BP (high blood pressure) 01/01/2009  . Hypercholesteremia 01/01/2009  . Benign fibroma of prostate 01/01/2009   Past Medical History  Diagnosis Date  . Anemia   . Hypertension   . Diverticulosis of colon (without mention of hemorrhage)   . Leg cramps   . Colon polyps   . Type II or unspecified type diabetes mellitus without mention of complication, not stated as uncontrolled   . Gout   . Impotence of organic origin   . Esophageal reflux   . Asthma   . Hyperlipidemia    Current Outpatient Prescriptions on File Prior to Visit  Medication Sig  . albuterol (PROVENTIL HFA;VENTOLIN HFA) 108 (90 BASE) MCG/ACT inhaler Inhale 2 puffs into the lungs every 6 (six) hours as needed.  Marland Kitchen allopurinol (ZYLOPRIM) 100 MG tablet Take 200 mg by mouth 2 (two) times daily.  Marland Kitchen amLODipine (NORVASC) 5 MG tablet Take 5 mg by mouth daily.  Marland Kitchen aspirin 81 MG tablet Take 81 mg by mouth daily.  . famotidine (PEPCID) 10 MG tablet Take 10 mg by mouth daily.  . finasteride (PROSCAR) 5 MG tablet Take 5 mg by mouth daily.  . fish  oil-omega-3 fatty acids 1000 MG capsule Take 1 g by mouth 3 (three) times daily.   . Fluticasone-Salmeterol (ADVAIR DISKUS) 250-50 MCG/DOSE AEPB Inhale 1 puff into the lungs 2 (two) times daily.  Marland Kitchen glipiZIDE (GLUCOTROL) 10 MG tablet Take 10 mg by mouth daily.  Marland Kitchen losartan (COZAAR) 100 MG  tablet Take 100 mg by mouth daily.  . Magnesium Citrate POWD by Does not apply route daily.  . metformin (FORTAMET) 500 MG (OSM) 24 hr tablet Take 500 mg by mouth daily with breakfast.  . MULTIPLE VITAMIN PO MULTIVITAMINS (Oral Tablet)  1 Every Day for 0 days  Quantity: 0.00;  Refills: 0   Ordered :06-Jan-2010  [name] ;  Started 22-Mar-2009 Active  . omeprazole (PRILOSEC OTC) 20 MG tablet PRILOSEC OTC, 20MG  (Oral Tablet Delayed Release)  1 Every Day for 0 days  Quantity: 0.00;  Refills: 0   Ordered :06-Jan-2010  Merleen Nicely ;  Started 01-Jan-2009 Active  . potassium chloride SA (K-DUR,KLOR-CON) 20 MEQ tablet Take 20 mEq by mouth daily.  . simvastatin (ZOCOR) 10 MG tablet Take 10 mg by mouth at bedtime.  Marland Kitchen terazosin (HYTRIN) 5 MG capsule Take 5 mg by mouth at bedtime.  . triamterene-hydrochlorothiazide (DYAZIDE) 37.5-25 MG per capsule Take 1 capsule by mouth every morning.   No current facility-administered medications on file prior to visit.   No Known Allergies Past Surgical History  Procedure Laterality Date  . Tonsillectomy    . Appendectomy    . Colonoscopy    . Esophagogastroduodenoscopy endoscopy    . Cyst excision     Social History   Social History  . Marital Status: Single    Spouse Name: N/A  . Number of Children: 2  . Years of Education: N/A   Occupational History  . Retired in 2004    Social History Main Topics  . Smoking status: Former Smoker -- 2.00 packs/day for 20 years    Types: Cigarettes, Pipe    Quit date: 11/21/2009  . Smokeless tobacco: Never Used     Comment: quit cigs in 70 and pipe in 01  . Alcohol Use: Yes     Comment: socially  . Drug Use: No  . Sexual Activity: Not on file   Other Topics Concern  . Not on file   Social History Narrative   Family History  Problem Relation Age of Onset  . Lung cancer Mother 17    smoked  . Lung cancer Father 60    smoked  . Heart disease Father       Objective:   Physical Exam   Constitutional: He appears well-developed and well-nourished.  Cardiovascular: Normal rate, regular rhythm and normal heart sounds.   Pulmonary/Chest: Effort normal and breath sounds normal. No respiratory distress. He has no wheezes. He has no rales.  Psychiatric: He has a normal mood and affect. His behavior is normal.          Assessment & Plan:   1. Type 2 diabetes mellitus with diabetic neuropathy Not to goal. Will Increase Metformin to 1000 mg in the pm, and continue Glipizide in am. Pt will double his medications he has at home. FU 3 months. No microalbumin strips in the office today, will check at FU. Does not how to watch for signs of lows. Will go back to lower does if this happens.   - POCT glycosylated hemoglobin (Hb A1C) Results for orders placed or performed in visit on 11/04/14  POCT glycosylated hemoglobin (Hb A1C)  Result Value Ref Range   Hemoglobin A1C 7.7    Est. average glucose Bld gHb Est-mCnc 174      Diabetic Foot Exam - Simple   Simple Foot Form  Diabetic Foot exam was performed with the following findings:  Yes 11/04/2014 11:20 AM  Visual Inspection  No deformities, no ulcerations, no other skin breakdown bilaterally:  Yes  Sensation Testing  Intact to touch and monofilament testing bilaterally:  Yes  Pulse Check  Posterior Tibialis and Dorsalis pulse intact bilaterally:  Yes  Comments      2. Essential hypertension Stable. Continue current medications and plan of care.  Patient seen and examined by Leo Grosser, MD, and note scribed by Allene Dillon, CMA. I have reviewed the document for accuracy and completeness and I agree with above. Leo Grosser, MD

## 2015-01-12 ENCOUNTER — Other Ambulatory Visit: Payer: Self-pay | Admitting: Family Medicine

## 2015-01-12 DIAGNOSIS — J45909 Unspecified asthma, uncomplicated: Secondary | ICD-10-CM

## 2015-02-03 ENCOUNTER — Other Ambulatory Visit: Payer: Self-pay | Admitting: Family Medicine

## 2015-02-09 ENCOUNTER — Ambulatory Visit (INDEPENDENT_AMBULATORY_CARE_PROVIDER_SITE_OTHER): Payer: Medicare Other | Admitting: Family Medicine

## 2015-02-09 ENCOUNTER — Encounter: Payer: Self-pay | Admitting: Family Medicine

## 2015-02-09 VITALS — BP 120/72 | HR 88 | Temp 97.6°F | Resp 20 | Wt 220.0 lb

## 2015-02-09 DIAGNOSIS — E119 Type 2 diabetes mellitus without complications: Secondary | ICD-10-CM | POA: Diagnosis not present

## 2015-02-09 DIAGNOSIS — E1140 Type 2 diabetes mellitus with diabetic neuropathy, unspecified: Secondary | ICD-10-CM | POA: Diagnosis not present

## 2015-02-09 DIAGNOSIS — I10 Essential (primary) hypertension: Secondary | ICD-10-CM | POA: Diagnosis not present

## 2015-02-09 DIAGNOSIS — J849 Interstitial pulmonary disease, unspecified: Secondary | ICD-10-CM | POA: Diagnosis not present

## 2015-02-09 DIAGNOSIS — R809 Proteinuria, unspecified: Secondary | ICD-10-CM | POA: Diagnosis not present

## 2015-02-09 DIAGNOSIS — IMO0001 Reserved for inherently not codable concepts without codable children: Secondary | ICD-10-CM

## 2015-02-09 LAB — POCT UA - MICROALBUMIN: Microalbumin Ur, POC: 100 mg/L

## 2015-02-09 LAB — POCT GLYCOSYLATED HEMOGLOBIN (HGB A1C)
ESTIMATED AVERAGE GLUCOSE: 180
HEMOGLOBIN A1C: 7.9

## 2015-02-09 NOTE — Progress Notes (Signed)
Subjective:    Patient ID: Jeremiah Gibson, male    DOB: [birth year], 79 y.o.   MRN: [id number]  Diabetes He presents for his follow-up (Last A1C 11/04/2014 and was 7.7%. Increased Metformin to 1000 mg po in the pm at LOV.) diabetic visit. He has type 2 diabetes mellitus. There are no hypoglycemic associated symptoms. Pertinent negatives for hypoglycemia include no headaches. Associated symptoms include polyphagia. Pertinent negatives for diabetes include no blurred vision, no chest pain, no fatigue, no foot paresthesias, no foot ulcerations, no polydipsia, no polyuria, no visual change, no weakness and no weight loss. Current diabetic treatment includes oral agent (dual therapy) (Metformin and Glipizide). He is compliant with treatment all of the time. He is following a generally healthy diet. He participates in exercise weekly (Silver Sneakers and track). His breakfast blood glucose range is generally 130-140 mg/dl. Eye exam is not current (will make appointment).  Hypertension This is a chronic problem. The problem is controlled. Associated symptoms include shortness of breath (on exertion). Pertinent negatives include no anxiety, blurred vision, chest pain, headaches, malaise/fatigue, neck pain, orthopnea, palpitations or peripheral edema. Treatments tried: Amlodipine 5 mg, Losartan 100 mg Triamterene-HCTZ 37.5-25. The current treatment provides moderate improvement. There are no compliance problems.       Review of Systems  Constitutional: Negative for weight loss, malaise/fatigue and fatigue.  Eyes: Negative for blurred vision.  Respiratory: Positive for shortness of breath (on exertion).   Cardiovascular: Negative for chest pain, palpitations and orthopnea.  Endocrine: Positive for polyphagia. Negative for polydipsia and polyuria.  Musculoskeletal: Negative for neck pain.  Neurological: Negative for weakness and headaches.   BP 120/72 mmHg  Pulse 88  Temp(Src) 97.6 F (36.4 C) (Oral)   Resp 20  Wt 220 lb (99.791 kg)   Patient Active Problem List   Diagnosis Date Noted  . Cardiac enlargement 08/04/2014  . DD (diverticular disease) 08/04/2014  . History of colon polyps 08/04/2014  . 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 Shortness of breath 11/27/2012  . Aortic arch atherosclerosis (HCC) 11/27/2012  . Coronary artery disease 11/27/2012  . Hyperlipidemia 11/27/2012  . ILD (interstitial lung disease) (HCC) 10/03/2011  . Cough 07/31/2011  . Airway hyperreactivity 03/22/2009  . Acid reflux 03/22/2009  . Gout 03/22/2009  . 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 Absolute anemia 01/01/2009  . Diabetes (HCC) 01/01/2009  . BP (high blood pressure) 01/01/2009  . Hypercholesteremia 01/01/2009  . Benign fibroma of prostate 01/01/2009   Past Medical History  Diagnosis Date  . Anemia   . Hypertension   . Diverticulosis of colon (without mention of hemorrhage)   . Leg cramps   . Colon polyps   . Type II or unspecified type diabetes mellitus without mention of complication, not stated as uncontrolled   . Gout   . Impotence of organic origin   . Esophageal reflux   . Asthma   . Hyperlipidemia    Current Outpatient Prescriptions on File Prior to Visit  Medication Sig  . ADVAIR DISKUS 250-50 MCG/DOSE AEPB Use 1 inhalation two times  daily for cough/asthma  . albuterol (PROVENTIL HFA;VENTOLIN HFA) 108 (90 BASE) MCG/ACT inhaler Inhale 2 puffs into the lungs every 6 (six) hours as needed.  Marland Kitchen allopurinol (ZYLOPRIM) 100 MG tablet Take 200 mg by mouth 2 (two) times daily.  Marland Kitchen amLODipine (NORVASC) 5 MG tablet Take 1 tablet by mouth  every day for blood  pressure  . aspirin 81 MG tablet Take 81 mg by mouth daily.  Marland Kitchen  famotidine (PEPCID) 10 MG tablet Take 10 mg by mouth daily.  . finasteride (PROSCAR) 5 MG tablet Take 1 tablet by mouth  every day  . fish oil-omega-3 fatty acids 1000 MG capsule Take 1 g by mouth 3 (three) times daily.   Marland Kitchen. glipiZIDE (GLUCOTROL XL) 10 MG 24 hr  tablet Take 1 tablet by mouth  daily with food for  diabetes; take with largest meal of the day  . glucose blood test strip Use as instructed  . losartan (COZAAR) 100 MG tablet Take 1 tablet by mouth  every day for blood  pressure  . Magnesium Citrate POWD by Does not apply route daily.  . metformin (FORTAMET) 1000 MG (OSM) 24 hr tablet Take 1 tablet (1,000 mg total) by mouth daily with breakfast.  . MULTIPLE VITAMIN PO MULTIVITAMINS (Oral Tablet)  1 Every Day for 0 days  Quantity: 0.00;  Refills: 0   Ordered :06-Jan-2010  [name], [name] ;  Started 22-Mar-2009 Active  . omeprazole (PRILOSEC OTC) 20 MG tablet PRILOSEC OTC, 20MG  (Oral Tablet Delayed Release)  1 Every Day for 0 days  Quantity: 0.00;  Refills: 0   Ordered :06-Jan-2010  Merleen NicelyHaith, Tammy ;  Started 01-Jan-2009 Active  . potassium chloride SA (K-DUR,KLOR-CON) 20 MEQ tablet Take 1 tablet by mouth  daily  . simvastatin (ZOCOR) 10 MG tablet Take 1 tablet by mouth  every day  . terazosin (HYTRIN) 5 MG capsule Take 1 capsule by mouth  every day for prostate  . triamterene-hydrochlorothiazide (MAXZIDE-25) 37.5-25 MG tablet Take 1 tablet by mouth  Every Day for blood  pressure   No current facility-administered medications on file prior to visit.   No Known Allergies Past Surgical History  Procedure Laterality Date  . Tonsillectomy    . Appendectomy    . Colonoscopy    . Esophagogastroduodenoscopy endoscopy    . Cyst excision     Social History   Social History  . Marital Status: Married    Spouse Name: N/A  . Number of Children: 2  . Years of Education: N/A   Occupational History  . Retired in 2004    Social History Main Topics  . Smoking status: Former Smoker -- 2.00 packs/day for 20 years    Types: Cigarettes, Pipe    Quit date: 11/21/2009  . Smokeless tobacco: Never Used     Comment: quit cigs in 70 and pipe in 01  . Alcohol Use: Yes     Comment: socially  . Drug Use: No  . Sexual Activity: Not on file   Other Topics  Concern  . Not on file   Social History Narrative   Family History  Problem Relation Age of Onset  . Lung cancer Mother [id number]    smoked  . Lung cancer Father 60    smoked  . Heart disease Father       Objective:   Physical Exam  Constitutional: He is oriented to person, place, and time. He appears well-developed and well-nourished.  Cardiovascular: Normal rate and regular rhythm.   Pulmonary/Chest: Effort normal and breath sounds normal.  Neurological: He is alert and oriented to person, place, and time.  Psychiatric: He has a normal mood and affect. His behavior is normal. Judgment and thought content normal.   BP 120/72 mmHg  Pulse 88  Temp(Src) 97.6 F (36.4 C) (Oral)  Resp 20  Wt 220 lb (99.791 kg)      Assessment & Plan:  1. Type 2 diabetes mellitus with  diabetic neuropathy, without long-term current use of insulin (HCC) Did not improve in last few months, but blames it on his diet. Wife was out of town for several weeks. Thinks he can improve things without medication. Did explain microalbuminuria and need to make sure that he does work to improve his sugars or we need to add Januvia. Recheck in 3 months.  - POCT UA - Microalbumin - POCT glycosylated hemoglobin (Hb A1C) Results for orders placed or performed in visit on 02/09/15  POCT UA - Microalbumin  Result Value Ref Range   Microalbumin Ur, POC 100 mg/L  POCT glycosylated hemoglobin (Hb A1C)  Result Value Ref Range   Hemoglobin A1C 7.9    Est. average glucose Bld gHb Est-mCnc 180     2. Essential hypertension Stable. Continue current medication and plan of care.     3. Type 2 diabetes mellitus with proteinuria or albuminuria As above. Changed diagnosis today.   - Comprehensive Metabolic Panel (CMET)  4. ILD (interstitial lung disease) (HCC) Follow up with pulmonologist in the new year.   Lorie Phenix, MD

## 2015-02-10 ENCOUNTER — Telehealth: Payer: Self-pay

## 2015-02-10 LAB — COMPREHENSIVE METABOLIC PANEL
ALT: 25 IU/L (ref 0–44)
AST: 21 IU/L (ref 0–40)
Albumin/Globulin Ratio: 1.8 (ref 1.1–2.5)
Albumin: 4.6 g/dL (ref 3.5–4.8)
Alkaline Phosphatase: 91 IU/L (ref 39–117)
BUN/Creatinine Ratio: 17 (ref 10–22)
BUN: 18 mg/dL (ref 8–27)
Bilirubin Total: 0.4 mg/dL (ref 0.0–1.2)
CALCIUM: 10 mg/dL (ref 8.6–10.2)
CO2: 27 mmol/L (ref 18–29)
CREATININE: 1.07 mg/dL (ref 0.76–1.27)
Chloride: 103 mmol/L (ref 97–106)
GFR, EST AFRICAN AMERICAN: 77 mL/min/{1.73_m2} (ref >59)
GFR, EST NON AFRICAN AMERICAN: 67 mL/min/{1.73_m2} (ref >59)
GLUCOSE: 190 mg/dL — AB (ref 65–99)
Globulin, Total: 2.6 g/dL (ref 1.5–4.5)
POTASSIUM: 4.2 mmol/L (ref 3.5–5.2)
Sodium: 146 mmol/L — ABNORMAL HIGH (ref 136–144)
TOTAL PROTEIN: 7.2 g/dL (ref 6.0–8.5)

## 2015-02-10 NOTE — Telephone Encounter (Signed)
Advised pt of lab results. Pt verbally acknowledges understanding. Janeshia Ciliberto Drozdowski, CMA   

## 2015-02-10 NOTE — Telephone Encounter (Signed)
LMTCB 02/10/2015  Thanks,   -Raechelle Sarti  

## 2015-02-10 NOTE — Telephone Encounter (Signed)
-----   Message from Lorie PhenixNancy Maloney, MD sent at 02/10/2015 11:39 AM EST ----- Labs stable. Please notify patient.  Thanks.

## 2015-03-25 ENCOUNTER — Other Ambulatory Visit: Payer: Self-pay | Admitting: Family Medicine

## 2015-03-25 DIAGNOSIS — E871 Hypo-osmolality and hyponatremia: Secondary | ICD-10-CM

## 2015-03-25 DIAGNOSIS — K219 Gastro-esophageal reflux disease without esophagitis: Secondary | ICD-10-CM

## 2015-03-25 DIAGNOSIS — M109 Gout, unspecified: Secondary | ICD-10-CM

## 2015-03-25 DIAGNOSIS — I10 Essential (primary) hypertension: Secondary | ICD-10-CM

## 2015-03-25 DIAGNOSIS — IMO0001 Reserved for inherently not codable concepts without codable children: Secondary | ICD-10-CM

## 2015-03-25 DIAGNOSIS — N400 Enlarged prostate without lower urinary tract symptoms: Secondary | ICD-10-CM

## 2015-03-25 DIAGNOSIS — J849 Interstitial pulmonary disease, unspecified: Secondary | ICD-10-CM

## 2015-03-25 DIAGNOSIS — J45909 Unspecified asthma, uncomplicated: Secondary | ICD-10-CM

## 2015-03-25 DIAGNOSIS — E785 Hyperlipidemia, unspecified: Secondary | ICD-10-CM

## 2015-03-25 MED ORDER — GLIPIZIDE ER 10 MG PO TB24
ORAL_TABLET | ORAL | Status: DC
Start: 2015-03-25 — End: 2015-03-26

## 2015-03-25 MED ORDER — FAMOTIDINE 10 MG PO TABS: 10.0000 mg | ORAL_TABLET | Freq: Every day | ORAL | Status: DC

## 2015-03-25 MED ORDER — FINASTERIDE 5 MG PO TABS: ORAL_TABLET | ORAL | Status: DC

## 2015-03-25 MED ORDER — LOSARTAN POTASSIUM 100 MG PO TABS: ORAL_TABLET | ORAL | Status: DC

## 2015-03-25 MED ORDER — ALLOPURINOL 100 MG PO TABS: 200.0000 mg | ORAL_TABLET | Freq: Two times a day (BID) | ORAL | Status: DC

## 2015-03-25 MED ORDER — METFORMIN HCL ER (OSM) 1000 MG PO TB24: 1000.0000 mg | ORAL_TABLET | Freq: Every day | ORAL | Status: DC

## 2015-03-25 MED ORDER — TERAZOSIN HCL 5 MG PO CAPS: ORAL_CAPSULE | ORAL | Status: DC

## 2015-03-25 MED ORDER — ALBUTEROL SULFATE HFA 108 (90 BASE) MCG/ACT IN AERS: 2.0000 | INHALATION_SPRAY | Freq: Four times a day (QID) | RESPIRATORY_TRACT | Status: DC | PRN

## 2015-03-25 MED ORDER — POTASSIUM CHLORIDE CRYS ER 20 MEQ PO TBCR: EXTENDED_RELEASE_TABLET | ORAL | Status: DC

## 2015-03-25 MED ORDER — SIMVASTATIN 10 MG PO TABS: ORAL_TABLET | ORAL | Status: DC

## 2015-03-25 MED ORDER — TRIAMTERENE-HCTZ 37.5-25 MG PO TABS: ORAL_TABLET | ORAL | Status: DC

## 2015-03-25 MED ORDER — OMEPRAZOLE MAGNESIUM 20 MG PO TBEC: 20.0000 mg | DELAYED_RELEASE_TABLET | Freq: Every day | ORAL | Status: DC

## 2015-03-25 MED ORDER — AMLODIPINE BESYLATE 5 MG PO TABS: ORAL_TABLET | ORAL | Status: DC

## 2015-03-25 MED ORDER — FLUTICASONE-SALMETEROL 250-50 MCG/DOSE IN AEPB: INHALATION_SPRAY | RESPIRATORY_TRACT | Status: DC

## 2015-03-25 NOTE — Telephone Encounter (Signed)
Patient is requesting refill on all his current meds expect the bs testing equipment  Wants it sent to the mail order  Call if any questions, also requesting to be done soon because is running out of Advair

## 2015-03-26 ENCOUNTER — Other Ambulatory Visit: Payer: Self-pay | Admitting: Family Medicine

## 2015-03-26 DIAGNOSIS — E785 Hyperlipidemia, unspecified: Secondary | ICD-10-CM

## 2015-03-26 DIAGNOSIS — I10 Essential (primary) hypertension: Secondary | ICD-10-CM

## 2015-03-26 DIAGNOSIS — K219 Gastro-esophageal reflux disease without esophagitis: Secondary | ICD-10-CM

## 2015-03-26 DIAGNOSIS — IMO0001 Reserved for inherently not codable concepts without codable children: Secondary | ICD-10-CM

## 2015-03-26 DIAGNOSIS — N400 Enlarged prostate without lower urinary tract symptoms: Secondary | ICD-10-CM

## 2015-03-26 DIAGNOSIS — M109 Gout, unspecified: Secondary | ICD-10-CM

## 2015-03-26 DIAGNOSIS — J45909 Unspecified asthma, uncomplicated: Secondary | ICD-10-CM

## 2015-03-26 DIAGNOSIS — E871 Hypo-osmolality and hyponatremia: Secondary | ICD-10-CM

## 2015-03-26 MED ORDER — ALLOPURINOL 100 MG PO TABS: 200.0000 mg | ORAL_TABLET | Freq: Two times a day (BID) | ORAL | Status: DC

## 2015-03-26 MED ORDER — AMLODIPINE BESYLATE 5 MG PO TABS: ORAL_TABLET | ORAL | Status: DC

## 2015-03-26 MED ORDER — POTASSIUM CHLORIDE CRYS ER 20 MEQ PO TBCR: EXTENDED_RELEASE_TABLET | ORAL | Status: DC

## 2015-03-26 MED ORDER — GLIPIZIDE ER 10 MG PO TB24: ORAL_TABLET | ORAL | Status: DC

## 2015-03-26 MED ORDER — ALBUTEROL SULFATE HFA 108 (90 BASE) MCG/ACT IN AERS: 2.0000 | INHALATION_SPRAY | Freq: Four times a day (QID) | RESPIRATORY_TRACT | Status: DC | PRN

## 2015-03-26 MED ORDER — SIMVASTATIN 10 MG PO TABS: ORAL_TABLET | ORAL | Status: DC

## 2015-03-26 MED ORDER — FLUTICASONE-SALMETEROL 250-50 MCG/DOSE IN AEPB: INHALATION_SPRAY | RESPIRATORY_TRACT | Status: DC

## 2015-03-26 MED ORDER — TERAZOSIN HCL 5 MG PO CAPS: ORAL_CAPSULE | ORAL | Status: DC

## 2015-03-26 MED ORDER — LOSARTAN POTASSIUM 100 MG PO TABS: ORAL_TABLET | ORAL | Status: DC

## 2015-03-26 MED ORDER — OMEPRAZOLE MAGNESIUM 20 MG PO TBEC: 20.0000 mg | DELAYED_RELEASE_TABLET | Freq: Every day | ORAL | Status: DC

## 2015-03-26 MED ORDER — FINASTERIDE 5 MG PO TABS: ORAL_TABLET | ORAL | Status: DC

## 2015-03-26 MED ORDER — METFORMIN HCL ER (OSM) 1000 MG PO TB24: 1000.0000 mg | ORAL_TABLET | Freq: Every day | ORAL | Status: DC

## 2015-03-26 MED ORDER — TRIAMTERENE-HCTZ 37.5-25 MG PO TABS: ORAL_TABLET | ORAL | Status: DC

## 2015-03-26 NOTE — Telephone Encounter (Signed)
Patient was last seen on 02/09/15. Please refill to mail order pharmacy.

## 2015-03-26 NOTE — Telephone Encounter (Signed)
Pt contacted office for refill request on the following medications:  Resend to the correct mail order [facility] mail order,  CB#[phone]/MW  albuterol (PROVENTIL HFA;VENTOLIN HFA) 108 (90 Base) MCG/ACT inhaler  allopurinol (ZYLOPRIM) 100 MG tablet  amLODipine (NORVASC) 5 MG tablet  finasteride (PROSCAR) 5 MG tablet  Fluticasone-Salmeterol (ADVAIR DISKUS) 250-50 MCG/DOSE AEPB  glipiZIDE (GLUCOTROL XL) 10 MG 24 hr tablet  losartan (COZAAR) 100 MG tablet  metformin (FORTAMET) 1000 MG (OSM) 24 hr tablet  omeprazole (PRILOSEC OTC) 20 MG tablet  potassium chloride SA (K-DUR,KLOR-CON) 20 MEQ tablet  simvastatin (ZOCOR) 10 MG tablet  terazosin (HYTRIN) 5 MG capsule  triamterene-hydrochlorothiazide (MAXZIDE-25) 37.5-25 MG tablet

## 2015-03-31 ENCOUNTER — Other Ambulatory Visit: Payer: Self-pay

## 2015-03-31 DIAGNOSIS — K219 Gastro-esophageal reflux disease without esophagitis: Secondary | ICD-10-CM

## 2015-03-31 DIAGNOSIS — E785 Hyperlipidemia, unspecified: Secondary | ICD-10-CM

## 2015-03-31 DIAGNOSIS — N400 Enlarged prostate without lower urinary tract symptoms: Secondary | ICD-10-CM

## 2015-03-31 DIAGNOSIS — E871 Hypo-osmolality and hyponatremia: Secondary | ICD-10-CM

## 2015-03-31 DIAGNOSIS — M109 Gout, unspecified: Secondary | ICD-10-CM

## 2015-03-31 DIAGNOSIS — J45909 Unspecified asthma, uncomplicated: Secondary | ICD-10-CM

## 2015-03-31 DIAGNOSIS — IMO0001 Reserved for inherently not codable concepts without codable children: Secondary | ICD-10-CM

## 2015-03-31 DIAGNOSIS — I10 Essential (primary) hypertension: Secondary | ICD-10-CM

## 2015-03-31 MED ORDER — FINASTERIDE 5 MG PO TABS: ORAL_TABLET | ORAL | Status: DC

## 2015-03-31 MED ORDER — GLIPIZIDE ER 10 MG PO TB24: ORAL_TABLET | ORAL | Status: DC

## 2015-03-31 MED ORDER — TRIAMTERENE-HCTZ 37.5-25 MG PO TABS: ORAL_TABLET | ORAL | Status: DC

## 2015-03-31 MED ORDER — AMLODIPINE BESYLATE 5 MG PO TABS: ORAL_TABLET | ORAL | Status: DC

## 2015-03-31 MED ORDER — LOSARTAN POTASSIUM 100 MG PO TABS
ORAL_TABLET | ORAL | Status: DC
Start: 2015-03-31 — End: 2016-05-29

## 2015-03-31 MED ORDER — OMEPRAZOLE MAGNESIUM 20 MG PO TBEC: 20.0000 mg | DELAYED_RELEASE_TABLET | Freq: Every day | ORAL | Status: DC

## 2015-03-31 MED ORDER — METFORMIN HCL ER (OSM) 1000 MG PO TB24: 1000.0000 mg | ORAL_TABLET | Freq: Every day | ORAL | Status: DC

## 2015-03-31 MED ORDER — FAMOTIDINE 10 MG PO TABS: 10.0000 mg | ORAL_TABLET | Freq: Every day | ORAL | Status: DC

## 2015-03-31 MED ORDER — ALBUTEROL SULFATE HFA 108 (90 BASE) MCG/ACT IN AERS: 2.0000 | INHALATION_SPRAY | Freq: Four times a day (QID) | RESPIRATORY_TRACT | Status: DC | PRN

## 2015-03-31 MED ORDER — POTASSIUM CHLORIDE CRYS ER 20 MEQ PO TBCR: EXTENDED_RELEASE_TABLET | ORAL | Status: DC

## 2015-03-31 MED ORDER — SIMVASTATIN 10 MG PO TABS: ORAL_TABLET | ORAL | Status: DC

## 2015-03-31 MED ORDER — ALLOPURINOL 100 MG PO TABS
200.0000 mg | ORAL_TABLET | Freq: Two times a day (BID) | ORAL | Status: DC
Start: 2015-03-31 — End: 2016-05-29

## 2015-03-31 MED ORDER — FLUTICASONE-SALMETEROL 250-50 MCG/DOSE IN AEPB: INHALATION_SPRAY | RESPIRATORY_TRACT | Status: DC

## 2015-03-31 MED ORDER — TERAZOSIN HCL 5 MG PO CAPS: ORAL_CAPSULE | ORAL | Status: DC

## 2015-04-23 ENCOUNTER — Encounter: Payer: Self-pay | Admitting: Family Medicine

## 2015-04-29 DIAGNOSIS — J209 Acute bronchitis, unspecified: Secondary | ICD-10-CM | POA: Diagnosis not present

## 2015-05-11 ENCOUNTER — Encounter: Payer: Self-pay | Admitting: Family Medicine

## 2015-05-11 ENCOUNTER — Ambulatory Visit (INDEPENDENT_AMBULATORY_CARE_PROVIDER_SITE_OTHER): Payer: Medicare Other | Admitting: Family Medicine

## 2015-05-11 VITALS — BP 118/72 | HR 98 | Temp 97.8°F | Resp 16 | Wt 219.0 lb

## 2015-05-11 DIAGNOSIS — M109 Gout, unspecified: Secondary | ICD-10-CM | POA: Diagnosis not present

## 2015-05-11 DIAGNOSIS — E7800 Pure hypercholesterolemia, unspecified: Secondary | ICD-10-CM

## 2015-05-11 DIAGNOSIS — IMO0001 Reserved for inherently not codable concepts without codable children: Secondary | ICD-10-CM

## 2015-05-11 DIAGNOSIS — R809 Proteinuria, unspecified: Secondary | ICD-10-CM

## 2015-05-11 DIAGNOSIS — I10 Essential (primary) hypertension: Secondary | ICD-10-CM | POA: Diagnosis not present

## 2015-05-11 DIAGNOSIS — J849 Interstitial pulmonary disease, unspecified: Secondary | ICD-10-CM

## 2015-05-11 DIAGNOSIS — E119 Type 2 diabetes mellitus without complications: Secondary | ICD-10-CM

## 2015-05-11 LAB — POCT GLYCOSYLATED HEMOGLOBIN (HGB A1C): HEMOGLOBIN A1C: 7.8

## 2015-05-11 MED ORDER — ALBUTEROL SULFATE HFA 108 (90 BASE) MCG/ACT IN AERS: 2.0000 | INHALATION_SPRAY | Freq: Four times a day (QID) | RESPIRATORY_TRACT | Status: DC | PRN

## 2015-05-11 MED ORDER — METFORMIN HCL 1000 MG PO TABS: 1000.0000 mg | ORAL_TABLET | Freq: Two times a day (BID) | ORAL | Status: DC

## 2015-05-11 NOTE — Progress Notes (Signed)
Patient ID: Jeremiah Gibson, male   DOB: Oct 13, 1935, 80 y.o.   MRN: [id number]         Patient: Jeremiah Gibson Male    DOB: [birth year]   80 y.o.   MRN: [id number] Visit Date: 05/11/2015  Today's Provider: Lorie Phenix, MD   Chief Complaint  Patient presents with  . Diabetes  . Hyperlipidemia  . Hypertension   Subjective:    HPI   Diabetes Mellitus Type II, Follow-up:   Lab Results  Component Value Date   HGBA1C 7.8 05/11/2015   HGBA1C 7.9 02/09/2015   HGBA1C 7.7 11/04/2014   Last seen for diabetes 3 months ago.  Management since then includes None. He reports excellent compliance with treatment. He is not having side effects.  Current symptoms include polydipsia and have been stable. Home blood sugar records: 120-130's  Episodes of hypoglycemia? no   Most Recent Eye Exam: 03/2015 Weight trend: stable Current diet: in general, a "healthy" diet   Current exercise: none  ------------------------------------------------------------------------   Hypertension, follow-up:  BP Readings from Last 3 Encounters:  05/11/15 118/72  02/09/15 120/72  11/04/14 128/64    He was last seen for hypertension 3 months ago.  Management since that visit includes None .He reports excellent compliance with treatment. He is not having side effects.  He is exercising. He is adherent to low salt diet.   He is experiencing none.  Patient denies chest pain, fatigue, lower extremity edema and palpitations.    ------------------------------------------------------------------------    Lipid/Cholesterol, Follow-up:   Last seen for this 3 months ago.  Management since that visit includes None.  Last Lipid Panel: No results found for: CHOL, TRIG, HDL, CHOLHDL, VLDL, LDLCALC, LDLDIRECT  He reports excellent compliance with treatment. He is not having side effects.   Wt Readings from Last 3 Encounters:  05/11/15 219 lb (99.338 kg)  02/09/15 220 lb (99.791 kg)  11/04/14 219  lb (99.338 kg)    ------------------------------------------------------------------------       No Known Allergies Previous Medications   ALBUTEROL (PROVENTIL HFA;VENTOLIN HFA) 108 (90 BASE) MCG/ACT INHALER    Inhale 2 puffs into the lungs every 6 (six) hours as needed.   ALLOPURINOL (ZYLOPRIM) 100 MG TABLET    Take 2 tablets (200 mg total) by mouth 2 (two) times daily.   AMLODIPINE (NORVASC) 5 MG TABLET    Take 1 tablet by mouth  every day for blood  pressure   ASPIRIN 81 MG TABLET    Take 81 mg by mouth daily.   FAMOTIDINE (PEPCID) 10 MG TABLET    Take 1 tablet (10 mg total) by mouth daily.   FINASTERIDE (PROSCAR) 5 MG TABLET    Take 1 tablet by mouth  every day   FISH OIL-OMEGA-3 FATTY ACIDS 1000 MG CAPSULE    Take 1 g by mouth 3 (three) times daily.    FLUTICASONE-SALMETEROL (ADVAIR DISKUS) 250-50 MCG/DOSE AEPB    Use 1 inhalation two times  daily for cough/asthma   GLIPIZIDE (GLUCOTROL XL) 10 MG 24 HR TABLET    Take 1 tablet by mouth  daily with food for  diabetes; take with largest meal of the day   GLUCOSE BLOOD TEST STRIP    Use as instructed   LOSARTAN (COZAAR) 100 MG TABLET    Take 1 tablet by mouth  every day for blood  pressure   MAGNESIUM CITRATE POWD    by Does not apply route daily.   METFORMIN (FORTAMET) 1000 MG (OSM) 24 HR  TABLET    Take 1 tablet (1,000 mg total) by mouth daily with breakfast.   MULTIPLE VITAMIN PO    MULTIVITAMINS (Oral Tablet)  1 Every Day for 0 days  Quantity: 0.00;  Refills: 0   Ordered :06-Jan-2010  [name] ;  Started 22-Mar-2009 Active   OMEPRAZOLE (PRILOSEC OTC) 20 MG TABLET    Take 1 tablet (20 mg total) by mouth daily.   POTASSIUM CHLORIDE SA (K-DUR,KLOR-CON) 20 MEQ TABLET    Take 1 tablet by mouth  daily   SIMVASTATIN (ZOCOR) 10 MG TABLET    Take 1 tablet by mouth  every day   TERAZOSIN (HYTRIN) 5 MG CAPSULE    Take 1 capsule by mouth  every day for prostate   TRIAMTERENE-HYDROCHLOROTHIAZIDE (MAXZIDE-25) 37.5-25 MG TABLET    Take 1  tablet by mouth  Every Day for blood  pressure    Review of Systems  Constitutional: Negative.   Respiratory: Positive for shortness of breath (Under good control with current medications.). Negative for apnea, cough, choking, chest tightness, wheezing and stridor.   Cardiovascular: Negative.   Gastrointestinal: Negative.   Musculoskeletal: Negative.   Neurological: Positive for numbness (Some tingling in his fingers). Negative for dizziness, light-headedness and headaches.    Social History  Substance Use Topics  . Smoking status: Former Smoker -- 2.00 packs/day for 20 years    Types: Cigarettes, Pipe    Quit date: 11/21/2009  . Smokeless tobacco: Never Used     Comment: quit cigs in 72 and pipe in 01  . Alcohol Use: Yes     Comment: socially   Objective:   BP 118/72 mmHg  Pulse 98  Temp(Src) 97.8 F (36.6 C) (Oral)  Resp 16  Wt 219 lb (99.338 kg)  Physical Exam  Constitutional: He is oriented to person, place, and time. He appears well-developed and well-nourished.  Cardiovascular: Normal rate, regular rhythm and normal heart sounds.   Pulmonary/Chest: Effort normal and breath sounds normal.  Neurological: He is alert and oriented to person, place, and time.  Psychiatric: He has a normal mood and affect. His behavior is normal. Judgment and thought content normal.        Assessment & Plan:     1. Essential hypertension Stable, continue current medication.  - TSH  2. Hypercholesteremia Stable, continue current medication. Will check labs today. - Lipid panel  3. Type 2 diabetes mellitus with proteinuria or albuminuria A1C about the same at 7.8%.  No changes right now; but will continue to work on lifestyle changes.  Recheck in three months.  - POCT glycosylated hemoglobin (Hb A1C) - Comprehensive metabolic panel - metFORMIN (GLUCOPHAGE) 1000 MG tablet; Take 1 tablet (1,000 mg total) by mouth 2 (two) times daily with a meal.  Dispense: 180 tablet; Refill: 1  4.  ILD (interstitial lung disease) (HCC) Under good control on current medication, but needs to follow up with the Pulmonologist.  Will set up a referral - Ambulatory referral to Pulmonology - albuterol (PROAIR HFA) 108 (90 Base) MCG/ACT inhaler; Inhale 2 puffs into the lungs every 6 (six) hours as needed for wheezing or shortness of breath.  Dispense: 2 Inhaler; Refill: 3  5. Gout, unspecified cause, unspecified chronicity, unspecified site Stable; will check labs today.  - CBC with Differential/Platelet - Uric acid     Patient was seen and examined by Leo Grosser, MD, and note scribed by Kavin Leech, CMA.  I have reviewed the document for accuracy and completeness and  I agree with above. - Leo Grosser, MD   Lorie Phenix, MD  [facility]

## 2015-05-12 LAB — CBC WITH DIFFERENTIAL/PLATELET
BASOS ABS: 0 10*3/uL (ref 0.0–0.2)
Basos: 0 %
EOS (ABSOLUTE): 0.1 10*3/uL (ref 0.0–0.4)
Eos: 2 %
HEMOGLOBIN: 12.4 g/dL — AB (ref 12.6–17.7)
Hematocrit: 37.3 % — ABNORMAL LOW (ref 37.5–51.0)
IMMATURE GRANS (ABS): 0 10*3/uL (ref 0.0–0.1)
IMMATURE GRANULOCYTES: 0 %
LYMPHS: 25 %
Lymphocytes Absolute: 1.8 10*3/uL (ref 0.7–3.1)
MCH: 30.1 pg (ref 26.6–33.0)
MCHC: 33.2 g/dL (ref 31.5–35.7)
MCV: 91 fL (ref 79–97)
MONOCYTES: 7 %
Monocytes Absolute: 0.5 10*3/uL (ref 0.1–0.9)
Neutrophils Absolute: 4.8 10*3/uL (ref 1.4–7.0)
Neutrophils: 66 %
Platelets: 245 10*3/uL (ref 150–379)
RBC: 4.12 x10E6/uL — AB (ref 4.14–5.80)
RDW: 14.5 % (ref 12.3–15.4)
WBC: 7.2 10*3/uL (ref 3.4–10.8)

## 2015-05-12 LAB — COMPREHENSIVE METABOLIC PANEL
ALBUMIN: 4.4 g/dL (ref 3.5–4.8)
ALK PHOS: 100 IU/L (ref 39–117)
ALT: 26 IU/L (ref 0–44)
AST: 24 IU/L (ref 0–40)
Albumin/Globulin Ratio: 1.6 (ref 1.1–2.5)
BUN / CREAT RATIO: 15 (ref 10–22)
BUN: 18 mg/dL (ref 8–27)
Bilirubin Total: 0.5 mg/dL (ref 0.0–1.2)
CALCIUM: 9.9 mg/dL (ref 8.6–10.2)
CO2: 23 mmol/L (ref 18–29)
CREATININE: 1.22 mg/dL (ref 0.76–1.27)
Chloride: 97 mmol/L (ref 96–106)
GFR calc Af Amer: 66 mL/min/{1.73_m2} (ref >59)
GFR calc non Af Amer: 57 mL/min/{1.73_m2} — ABNORMAL LOW (ref >59)
GLUCOSE: 177 mg/dL — AB (ref 65–99)
Globulin, Total: 2.7 g/dL (ref 1.5–4.5)
Potassium: 4 mmol/L (ref 3.5–5.2)
Sodium: 140 mmol/L (ref 134–144)
TOTAL PROTEIN: 7.1 g/dL (ref 6.0–8.5)

## 2015-05-12 LAB — LIPID PANEL
CHOLESTEROL TOTAL: 177 mg/dL (ref 100–199)
Chol/HDL Ratio: 4.2 ratio units (ref 0.0–5.0)
HDL: 42 mg/dL (ref >39)
LDL Calculated: 99 mg/dL (ref 0–99)
Triglycerides: 178 mg/dL — ABNORMAL HIGH (ref 0–149)
VLDL CHOLESTEROL CAL: 36 mg/dL (ref 5–40)

## 2015-05-12 LAB — URIC ACID: URIC ACID: 4.7 mg/dL (ref 3.7–8.6)

## 2015-05-12 LAB — TSH: TSH: 2.76 u[IU]/mL (ref 0.450–4.500)

## 2015-05-14 MED ORDER — ALBUTEROL SULFATE HFA 108 (90 BASE) MCG/ACT IN AERS: 2.0000 | INHALATION_SPRAY | Freq: Four times a day (QID) | RESPIRATORY_TRACT | Status: DC | PRN

## 2015-05-14 MED ORDER — METFORMIN HCL 1000 MG PO TABS: 1000.0000 mg | ORAL_TABLET | Freq: Two times a day (BID) | ORAL | Status: DC

## 2015-05-14 NOTE — Addendum Note (Signed)
Addended by: Miachel RouxALEKSANDROVA, ANASTASIYA V on: 05/14/2015 08:59 AM   Modules accepted: Orders

## 2015-05-17 ENCOUNTER — Other Ambulatory Visit: Payer: Self-pay | Admitting: Family Medicine

## 2015-05-17 DIAGNOSIS — R059 Cough, unspecified: Secondary | ICD-10-CM

## 2015-05-17 DIAGNOSIS — R05 Cough: Secondary | ICD-10-CM

## 2015-05-17 MED ORDER — BENZONATATE 100 MG PO CAPS: 100.0000 mg | ORAL_CAPSULE | Freq: Three times a day (TID) | ORAL | Status: DC | PRN

## 2015-05-19 ENCOUNTER — Other Ambulatory Visit: Payer: Self-pay

## 2015-05-19 MED ORDER — ALBUTEROL SULFATE HFA 108 (90 BASE) MCG/ACT IN AERS: 2.0000 | INHALATION_SPRAY | RESPIRATORY_TRACT | Status: DC | PRN

## 2015-05-19 NOTE — Telephone Encounter (Signed)
[employer] representative called back in regards to Proair and she states it is not going to be covered but they can approve Ventolin HFA, please let them know if you want to switch. CB: [phone]

## 2015-05-19 NOTE — Telephone Encounter (Signed)
Please send into pharmacy, and fill in Sig as Im not sure how it is written. Thanks! Pharmacy listed is correct

## 2015-05-19 NOTE — Telephone Encounter (Signed)
Ok to switch. Thanks

## 2015-06-03 ENCOUNTER — Telehealth: Payer: Self-pay | Admitting: Family Medicine

## 2015-06-03 DIAGNOSIS — K219 Gastro-esophageal reflux disease without esophagitis: Secondary | ICD-10-CM

## 2015-06-03 DIAGNOSIS — R05 Cough: Secondary | ICD-10-CM

## 2015-06-03 DIAGNOSIS — R059 Cough, unspecified: Secondary | ICD-10-CM

## 2015-06-03 NOTE — Telephone Encounter (Signed)
Pt states he seen Dr Elease HashimotoMaloney a month ago but not for a cough.  Pt states he has had a cough for 3 weeks.  Pt does not want to come in for an office visit.  Pt would like for Dr Elease HashimotoMaloney or the nurse to call him.  CB#[phone]/MW

## 2015-06-03 NOTE — Telephone Encounter (Signed)
Patient reports that he has had a cough X 3 weeks. He reports that it is non-productive, and denies any URI symptoms. He states that cough is worse 1 hour after eating. Patient thinks that it could be a reflux issue. Patient is wanting to know if he should be referred to GI? He would like to see Dr. Marva PandaSkulskie. He states that he also has a F/U appt with pulmonology in about 1 month. OK to refer to GI? Please advise. Thanks!

## 2015-06-03 NOTE — Telephone Encounter (Signed)
Ok to refer to GI.  Thanks.

## 2015-06-03 NOTE — Telephone Encounter (Signed)
Referral to GI in progress.

## 2015-06-14 ENCOUNTER — Other Ambulatory Visit: Payer: Self-pay | Admitting: Gastroenterology

## 2015-06-14 DIAGNOSIS — R05 Cough: Secondary | ICD-10-CM | POA: Diagnosis not present

## 2015-06-14 DIAGNOSIS — R058 Other specified cough: Secondary | ICD-10-CM

## 2015-06-14 DIAGNOSIS — K219 Gastro-esophageal reflux disease without esophagitis: Secondary | ICD-10-CM | POA: Diagnosis not present

## 2015-06-21 ENCOUNTER — Ambulatory Visit
Admission: RE | Admit: 2015-06-21 | Discharge: 2015-06-21 | Disposition: A | Discharge status: Home / Self Care | Payer: Medicare Other | Source: Ambulatory Visit | Attending: Gastroenterology | Admitting: Gastroenterology

## 2015-06-21 DIAGNOSIS — R1312 Dysphagia, oropharyngeal phase: Secondary | ICD-10-CM

## 2015-06-21 DIAGNOSIS — K219 Gastro-esophageal reflux disease without esophagitis: Secondary | ICD-10-CM

## 2015-06-21 DIAGNOSIS — R05 Cough: Secondary | ICD-10-CM | POA: Insufficient documentation

## 2015-06-21 DIAGNOSIS — R058 Other specified cough: Secondary | ICD-10-CM

## 2015-06-21 DIAGNOSIS — K224 Dyskinesia of esophagus: Secondary | ICD-10-CM | POA: Diagnosis not present

## 2015-06-21 NOTE — Therapy (Signed)
[facility] DIAGNOSTIC RADIOLOGY [address] [place], [place], [address] Phone: [phone]   Fax:     Modified Barium Swallow  Patient Details  Name: Jeremiah Gibson MRN: [id number] Date of Birth: 1935-11-03 No Data Recorded  Encounter Date: 06/21/2015      End of Session - 06/21/15 1409    Visit Number 1   Number of Visits 1   Date for SLP Re-Evaluation 06/21/15   SLP Start Time 1245   SLP Stop Time  1330   SLP Time Calculation (min) 45 min   Activity Tolerance Patient tolerated treatment well      Past Medical History  Diagnosis Date  . Anemia   . Hypertension   . Diverticulosis of colon (without mention of hemorrhage)   . Leg cramps   . Colon polyps   . Type II or unspecified type diabetes mellitus without mention of complication, not stated as uncontrolled   . Gout   . Impotence of organic origin   . Esophageal reflux   . Asthma   . Hyperlipidemia     Past Surgical History  Procedure Laterality Date  . Tonsillectomy    . Appendectomy    . Colonoscopy    . Esophagogastroduodenoscopy endoscopy    . Cyst excision      There were no vitals filed for this visit.   Subjective: Patient behavior: (alertness, ability to follow instructions, etc.): Patient able to state his swallowing history and follow directions  Chief complaint: chronic dry cough patient states this occurs 2 hours after eating   Objective:  Radiological Procedure: A videoflouroscopic evaluation of oral-preparatory, reflex initiation, and pharyngeal phases of the swallow was performed; as well as a screening of the upper esophageal phase.  I. POSTURE: Upright in MBS chair  II. VIEW: Lateral  III. COMPENSATORY STRATEGIES: N/A  IV. BOLUSES ADMINISTERED:   Thin Liquid: 2 small sips; 3 rapid, consecutive sips   Nectar-thick Liquid: 1 large sip    Puree: 3 teaspoon presentations   Mechanical Soft: 1/4 graham cracker in applesauce  V. RESULTS OF  EVALUATION: A. ORAL PREPARATORY PHASE: (The lips, tongue, and velum are observed for strength and coordination)       **Overall Severity Rating: Within normal limits  B. SWALLOW INITIATION/REFLEX: (The reflex is normal if "triggered" by the time the bolus reached the base of the tongue)  **Overall Severity Rating: Mild- triggering at the valleculae for thicker textures and while falling from the valleculae to the pyriform sinuses with thin liquids  C. PHARYNGEAL PHASE: (Pharyngeal function is normal if the bolus shows rapid, smooth, and continuous transit through the pharynx and there is no pharyngeal residue after the swallow)  **Overall Severity Rating: Within normal limits  D. LARYNGEAL PENETRATION: (Material entering into the laryngeal inlet/vestibule but not aspirated)  None  E. ASPIRATION: None  F. ESOPHAGEAL PHASE: (Screening of the upper esophagus) An esophageal sweep in the upright position with liquid and solid  consistencies showed esophageal retention with retrograde flow within the  esophagus, but not through the PES.    ASSESSMENT: 80 year old man, with dry cough (typically occurring 2 hours after eating), is presenting with minimal oropharyngeal dysphagia.  Oral control of the bolus including oral hold, rotary mastication, and anterior to posterior transfer are within normal limits. Timing of the pharyngeal swallow is delayed, triggering at the valleculae for thicker textures and while falling from the valleculae to the pyriform sinuses with thin liquids.  Pharyngeal aspects of  swallow including hyolaryngeal excursion, tongue base retraction, epiglottic inversion, duration/amplitude of UES opening, and laryngeal vestibule closure at the height of the swallow are within normal limits.  The patient is at not at significant risk for prandial aspiration.  An esophageal sweep in the upright position with liquid and solid  consistencies showed esophageal retention with retrograde flow within  the  esophagus, but not through the PES.    PLAN/RECOMMENDATIONS:   A. Diet: usual diet   B. Swallowing Precautions: reflux precautions   C. Recommended consultation to: follow up with GI as recommended; may benefit from referral to ENT for chronic cough   D. Therapy recommendations: N/A   E. Results and recommendations were discussed with the patient immediately following the study and the final report routed to referring practitioner.       Patient will benefit from skilled therapeutic intervention in order to improve the following deficits and impairments:   Dysphagia, oropharyngeal phase  Dry cough - Plan: DG OP Swallowing Func-Medicare/Speech Path, DG OP Swallowing Func-Medicare/Speech Path  Gastroesophageal reflux disease, esophagitis presence not specified - Plan: DG OP Swallowing Func-Medicare/Speech Path, DG OP Swallowing Func-Medicare/Speech Path      G-Codes - 06/21/15 1411    Functional Assessment Tool Used MBS, clinical judgment   Functional Limitations Swallowing   Swallow Current Status ([id number](G8996) At least 1 percent but less than 20 percent impaired, limited or restricted   Swallow Goal Status ([id number](G8997) At least 1 percent but less than 20 percent impaired, limited or restricted   Swallow Discharge Status [phone]([id number]) At least 1 percent but less than 20 percent impaired, limited or restricted          Problem List Patient Active Problem List   Diagnosis Date Noted  . Cardiac enlargement 08/04/2014  . DD (diverticular disease) 08/04/2014  . History of colon polyps 08/04/2014  . 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 Shortness of breath 11/27/2012  . Aortic arch atherosclerosis (HCC) 11/27/2012  . Coronary artery disease 11/27/2012  . Hyperlipidemia 11/27/2012  . ILD (interstitial lung disease) (HCC) 10/03/2011  . Cough 07/31/2011  . Airway hyperreactivity 03/22/2009  . Acid reflux 03/22/2009  . Gout 03/22/2009  . ED (erectile dysfunction) of organic origin 03/22/2009   . Absolute anemia 01/01/2009  . 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 BP (high blood pressure) 01/01/2009  . Hypercholesteremia 01/01/2009  . Benign fibroma of prostate 01/01/2009   Dollene PrimroseSusan G Lesa Vandall, MS/CCC- SLP  Leandrew KoyanagiAbernathy, Susie 06/21/2015, Cheri Kearns2:12 PM  [facility] DIAGNOSTIC RADIOLOGY [address] [place], [place], [id number] Phone: [phone]   Fax:     Name: Lutricia FeilVincent Donner MRN: [id number] Date of Birth: [birth year]

## 2015-06-25 ENCOUNTER — Telehealth: Payer: Self-pay | Admitting: Family Medicine

## 2015-06-25 DIAGNOSIS — IMO0001 Reserved for inherently not codable concepts without codable children: Secondary | ICD-10-CM

## 2015-06-25 MED ORDER — METFORMIN HCL 500 MG PO TABS: 500.0000 mg | ORAL_TABLET | Freq: Two times a day (BID) | ORAL | Status: DC

## 2015-06-25 NOTE — Telephone Encounter (Signed)
Spoke with pt.  His insurance is not going to cover Metformin 1,000mg  Twice a day.  (Even the short acting Metformin.)  He says he has never had that high of a dose before and does not remember it being increased to 1,000mg  BID.  Right now he is only taking 1,000mg  once a day and his blood sugars are "running good" for him.  (It was 117 this morning.)  Pt says his insurance will cover 500mg  twice a day.  Please advised.   Thanks,   -Vernona RiegerLaura

## 2015-06-25 NOTE — Telephone Encounter (Signed)
Rx sent off. Left message advising pt.   Thanks,   -Vernona RiegerLaura

## 2015-06-25 NOTE — Telephone Encounter (Signed)
Pt states [employer] is question the quality for the RX metFORMIN (GLUCOPHAGE) 1000 MG tablet.  CB#[phone]/MW

## 2015-06-25 NOTE — Telephone Encounter (Signed)
Ok to change to 500 mg bid. Thanks.

## 2015-07-06 ENCOUNTER — Encounter: Payer: Self-pay | Admitting: Pulmonary Disease

## 2015-07-06 ENCOUNTER — Other Ambulatory Visit (INDEPENDENT_AMBULATORY_CARE_PROVIDER_SITE_OTHER): Payer: Medicare Other

## 2015-07-06 ENCOUNTER — Ambulatory Visit (INDEPENDENT_AMBULATORY_CARE_PROVIDER_SITE_OTHER): Payer: Medicare Other | Admitting: Pulmonary Disease

## 2015-07-06 VITALS — BP 146/84 | HR 81 | Ht 70.0 in | Wt 220.0 lb

## 2015-07-06 DIAGNOSIS — K219 Gastro-esophageal reflux disease without esophagitis: Secondary | ICD-10-CM

## 2015-07-06 DIAGNOSIS — J849 Interstitial pulmonary disease, unspecified: Secondary | ICD-10-CM

## 2015-07-06 LAB — SEDIMENTATION RATE: SED RATE: 52 mm/h — AB (ref 0–22)

## 2015-07-06 MED ORDER — PREDNISONE 10 MG PO TABS: 10.0000 mg | ORAL_TABLET | Freq: Every day | ORAL | Status: DC

## 2015-07-06 NOTE — Progress Notes (Signed)
Subjective:    Patient ID: Jeremiah Gibson, male    DOB: 09-Sep-1935, 80 y.o.   MRN: [id number]  HPI  80 y.o remote ex smoker for FU of cough variant asthma  & ILD  [place]. He was evaluated by Pulmonology in [place] in 2008 for cough & wheezing & diagnosed with asthma. Review of his spirometry records essentially show mild restriction with FEV1 in the 72% range and normal FEV1 ratio. However spirometry from 6/08 does show significant bronchodilator response which could be consistent with some reversible disease. On this basis, he has been maintained on a regimen of Advair 50/50 twice a day and albuterol as needed for rescue . Allergy testing was negative.  He also has diabetes hyperlipidemia and hypertension.  He smoked from his teenage years upto age 80. He worked in [employer] and was [place] [employer] [place].    07/06/2015  Chief Complaint  Patient presents with  . Follow-up    persistent dry cough. Advair is too expensive, wants to know if he can get something that is generic.  wheezing at night. doesn't cough when laying down.     He was lost to follow-up for 2 years He states that cough is persistent for the last 2-3 months-it remains a dry nonproductive cough, no clear trigger-seems to occur after he eats He has been maintained on antireflux therapy, cough pulse did not help much His dyspnea has not worsened He denies pedal edema or frequent chest colds  He was empirically treated for upper airway cough & GERD With  60% improvement  He wonders if Advair has stopped working  Significant tests/ events  2013 HRCT >>Mild subpleural reticulation/fibrosis, nonspecific. No definite honeycombing . Mild bilateral lower lobe bronchiectasis  RAST -neg  Short course of prednisone - helped  Trial of dexilant - seemed to be better than omeprazole , but he prefers generics.  PFTs 09/2011 - no airway obstruction, nml lung  volumes, DLCO 64%   [facility]  - labs ok, CXR clear, spirometry  -no obstruction PFTs 11/2012 -slight drop in FVC from 3.8 to 3.5, DLCO increased to 17.9 (57%), TLC dropped to 4.95(76%)    Past Medical History  Diagnosis Date  . Anemia   . Hypertension   . Diverticulosis of colon (without mention of hemorrhage)   . Leg cramps   . Colon polyps   . Type II or unspecified type diabetes mellitus without mention of complication, not stated as uncontrolled   . Gout   . Impotence of organic origin   . Esophageal reflux   . Asthma   . Hyperlipidemia     Review of Systems neg for any significant sore throat, dysphagia, itching, sneezing, nasal congestion or excess/ purulent secretions, fever, chills, sweats, unintended wt loss, pleuritic or exertional cp, hempoptysis, orthopnea pnd or change in chronic leg swelling. Also denies presyncope, palpitations, heartburn, abdominal pain, nausea, vomiting, diarrhea or change in bowel or urinary habits, dysuria,hematuria, rash, arthralgias, visual complaints, headache, numbness weakness or ataxia.     Objective:   Physical Exam  Gen. Pleasant, obese, in no distress, normal affect ENT - no lesions, no post nasal drip, class 2-3 airway Neck: No JVD, no thyromegaly, no carotid bruits Lungs: no use of accessory muscles, no dullness to percussion, bibasal rales, no rhonchi  Cardiovascular: Rhythm regular, heart sounds  normal, no murmurs or gallops, no peripheral  edema Abdomen: soft and non-tender, no hepatosplenomegaly, BS normal. Musculoskeletal: No deformities, no cyanosis or clubbing Neuro:  alert, non focal, no tremors       Assessment & Plan:

## 2015-07-06 NOTE — Assessment & Plan Note (Signed)
Continue omeprazole twice daily.  

## 2015-07-06 NOTE — Patient Instructions (Signed)
Your cough may be related to pulmonary fibrosis HRCT scan of the chest Pulmonary function testing Blood work today  Start prednisone 10 mg daily with breakfast until next visit Call me back if sugars go higher than 180  Use Delsym cough syrup 5 ML twice daily as needed for cough

## 2015-07-06 NOTE — Assessment & Plan Note (Addendum)
Unfortunately, his cough may be related to pulmonary fibrosis -this may also be related to GERD HRCT scan of the chest Pulmonary function testing ANA, ESR, CCP today  Start prednisone 10 mg daily with breakfast until next visit Call me back if sugars go higher than 180  Use Delsym cough syrup 5 ML twice daily as needed for cough

## 2015-07-07 LAB — ANA: Anti Nuclear Antibody(ANA): NEGATIVE

## 2015-07-07 LAB — CYCLIC CITRUL PEPTIDE ANTIBODY, IGG: Cyclic Citrullin Peptide Ab: <16 Units

## 2015-07-20 ENCOUNTER — Ambulatory Visit (INDEPENDENT_AMBULATORY_CARE_PROVIDER_SITE_OTHER): Payer: Medicare Other | Admitting: *Deleted

## 2015-07-20 DIAGNOSIS — K219 Gastro-esophageal reflux disease without esophagitis: Secondary | ICD-10-CM

## 2015-07-20 DIAGNOSIS — J849 Interstitial pulmonary disease, unspecified: Secondary | ICD-10-CM

## 2015-07-20 LAB — PULMONARY FUNCTION TEST
DL/VA % PRED: 78 %
DL/VA: 3.56 ml/min/mmHg/L
DLCO unc % pred: 57 %
DLCO unc: 17.86 ml/min/mmHg
FEF 25-75 POST: 2.54 L/s
FEF 25-75 Pre: 3.17 L/sec
FEF2575-%CHANGE-POST: -19 %
FEF2575-%Pred-Post: 126 %
FEF2575-%Pred-Pre: 158 %
FEV1-%CHANGE-POST: -5 %
FEV1-%PRED-PRE: 98 %
FEV1-%Pred-Post: 93 %
FEV1-Post: 2.65 L
FEV1-Pre: 2.8 L
FEV1FVC-%CHANGE-POST: 0 %
FEV1FVC-%Pred-Pre: 113 %
FEV6-%Change-Post: -5 %
FEV6-%Pred-Post: 87 %
FEV6-%Pred-Pre: 92 %
FEV6-POST: 3.22 L
FEV6-Pre: 3.43 L
FEV6FVC-%Pred-Post: 107 %
FEV6FVC-%Pred-Pre: 107 %
FVC-%CHANGE-POST: -5 %
FVC-%PRED-POST: 81 %
FVC-%PRED-PRE: 86 %
FVC-POST: 3.22 L
FVC-Pre: 3.43 L
POST FEV1/FVC RATIO: 82 %
POST FEV6/FVC RATIO: 100 %
PRE FEV1/FVC RATIO: 82 %
Pre FEV6/FVC Ratio: 100 %
RV % pred: 30 %
RV: 0.79 L
TLC % PRED: 64 %
TLC: 4.4 L

## 2015-07-20 NOTE — Progress Notes (Signed)
PFT performed today with Nitrogen washout. 

## 2015-07-22 ENCOUNTER — Ambulatory Visit
Admission: RE | Admit: 2015-07-22 | Discharge: 2015-07-22 | Disposition: A | Discharge status: Home / Self Care | Payer: Medicare Other | Source: Ambulatory Visit | Attending: Pulmonary Disease | Admitting: Pulmonary Disease

## 2015-07-22 DIAGNOSIS — J84112 Idiopathic pulmonary fibrosis: Secondary | ICD-10-CM | POA: Insufficient documentation

## 2015-07-22 DIAGNOSIS — I2510 Atherosclerotic heart disease of native coronary artery without angina pectoris: Secondary | ICD-10-CM | POA: Insufficient documentation

## 2015-07-22 DIAGNOSIS — I288 Other diseases of pulmonary vessels: Secondary | ICD-10-CM | POA: Diagnosis not present

## 2015-07-22 DIAGNOSIS — J479 Bronchiectasis, uncomplicated: Secondary | ICD-10-CM | POA: Insufficient documentation

## 2015-07-22 DIAGNOSIS — R918 Other nonspecific abnormal finding of lung field: Secondary | ICD-10-CM | POA: Insufficient documentation

## 2015-07-22 DIAGNOSIS — J849 Interstitial pulmonary disease, unspecified: Secondary | ICD-10-CM | POA: Diagnosis not present

## 2015-07-22 DIAGNOSIS — K219 Gastro-esophageal reflux disease without esophagitis: Secondary | ICD-10-CM | POA: Insufficient documentation

## 2015-07-22 DIAGNOSIS — R911 Solitary pulmonary nodule: Secondary | ICD-10-CM | POA: Diagnosis not present

## 2015-08-06 ENCOUNTER — Encounter: Payer: Self-pay | Admitting: Pulmonary Disease

## 2015-08-06 ENCOUNTER — Ambulatory Visit (INDEPENDENT_AMBULATORY_CARE_PROVIDER_SITE_OTHER): Payer: Medicare Other | Admitting: Pulmonary Disease

## 2015-08-06 VITALS — BP 150/80 | HR 66 | Ht 70.0 in | Wt 221.0 lb

## 2015-08-06 DIAGNOSIS — J4530 Mild persistent asthma, uncomplicated: Secondary | ICD-10-CM | POA: Diagnosis not present

## 2015-08-06 DIAGNOSIS — J849 Interstitial pulmonary disease, unspecified: Secondary | ICD-10-CM

## 2015-08-06 MED ORDER — FLUTICASONE-SALMETEROL 250-50 MCG/DOSE IN AEPB: 1.0000 | INHALATION_SPRAY | Freq: Two times a day (BID) | RESPIRATORY_TRACT | Status: DC

## 2015-08-06 MED ORDER — PREDNISONE 5 MG PO TABS: 5.0000 mg | ORAL_TABLET | ORAL | Status: DC

## 2015-08-06 NOTE — Progress Notes (Signed)
Subjective:    Patient ID: Jeremiah Gibson, male    DOB: 1935-05-05, 80 y.o.   MRN: [id number]  HPI  80 y.o remote ex smoker for FU of cough variant asthma  & ILD  [place]. He was evaluated by [place]monology in [place] in 2008 for cough & wheezing & diagnosed with asthma. Review of his spirometry records essentially show mild restriction with FEV1 in the 72% range and normal FEV1 ratio. However spirometry from 6/08 does show significant bronchodilator response which could be consistent with some reversible disease. On this basis, he was maintained on a regimen of Advair 250/50 twice a day and albuterol as needed for rescue . Allergy testing was negative.  He also has diabetes hyperlipidemia and hypertension.  He smoked from his teenage years upto age 80. He worked in [employer] and was [place] [employer] [place].    08/06/2015  Chief Complaint  Patient presents with  . Follow-up    breathing is better on the prednisone, wants to discuss getting off prednisone.    1mth FU He was started on 10 mg prednisone last month For worsening cough and this seems to have helped, his cough is mostly gone  He has been maintained on antireflux therapy, cough perles  did not help much His dyspnea has not worsened He denies pedal edema or frequent chest colds  He was empirically treated for upper airway cough & GERD With  60% improvement  He wonders if Advair has stopped working and complains about cost  We reviewed his CT scan and pulmonary function tests in detail  Significant tests/ events  2013 HRCT >>Mild subpleural reticulation/fibrosis, nonspecific. No definite honeycombing . Mild bilateral lower lobe bronchiectasis   HRCT >> UIP pattern, worse  RAST -neg  Short course of prednisone - helped  Trial of dexilant - seemed to be better than omeprazole , but he prefers generics.  PFTs 09/2011 - no airway obstruction, nml lung  volumes, DLCO 64%   [facility]  - labs ok, CXR clear, spirometry  -no obstruction PFTs 11/2012 -slight drop in FVC from 3.8 to 3.5, DLCO increased to 17.9 (57%), TLC dropped to 4.95(76%)   Past Medical History  Diagnosis Date  . Anemia   . Hypertension   . Diverticulosis of colon (without mention of hemorrhage)   . Leg cramps   . Colon polyps   . Type II or unspecified type diabetes mellitus without mention of complication, not stated as uncontrolled   . Gout   . Impotence of organic origin   . Esophageal reflux   . Asthma   . Hyperlipidemia      Review of Systems neg for any significant sore throat, dysphagia, itching, sneezing, nasal congestion or excess/ purulent secretions, fever, chills, sweats, unintended wt loss, pleuritic or exertional cp, hempoptysis, orthopnea pnd or change in chronic leg swelling. Also denies presyncope, palpitations, heartburn, abdominal pain, nausea, vomiting, diarrhea or change in bowel or urinary habits, dysuria,hematuria, rash, arthralgias, visual complaints, headache, numbness weakness or ataxia.     Objective:   Physical Exam  Gen. Pleasant, well-nourished, in no distress ENT - no lesions, no post nasal drip Neck: No JVD, no thyromegaly, no carotid bruits Lungs: no use of accessory muscles, no dullness to percussion, bibasal rales , no  rhonchi  Cardiovascular: Rhythm regular, heart sounds  normal, no murmurs or gallops, no peripheral edema Musculoskeletal:  No deformities, no cyanosis or clubbing        Assessment & Plan:

## 2015-08-06 NOTE — Patient Instructions (Signed)
decrease prednisone to 5 mg daily, starting June 15-decreased to 5 mg on Monday/Wednesday/Friday   We discussed your scans suggest idiopathic pulmonary fibrosis Lung function is stable since 2014 We will send prescription for OFEV -start with once a day and increase to twice daily 

## 2015-08-06 NOTE — Assessment & Plan Note (Signed)
Samples of Advair or breo

## 2015-08-06 NOTE — Assessment & Plan Note (Signed)
decrease prednisone to 5 mg daily, starting June 15-decreased to 5 mg on Monday/Wednesday/Friday   We discussed your scans suggest idiopathic pulmonary fibrosis Lung function is stable since 2014 We will send prescription for OFEV -start with once a day and increase to twice daily

## 2015-08-10 ENCOUNTER — Encounter: Payer: Self-pay | Admitting: Family Medicine

## 2015-08-10 ENCOUNTER — Ambulatory Visit (INDEPENDENT_AMBULATORY_CARE_PROVIDER_SITE_OTHER): Payer: Medicare Other | Admitting: Family Medicine

## 2015-08-10 ENCOUNTER — Ambulatory Visit: Payer: Medicare Other | Admitting: Family Medicine

## 2015-08-10 VITALS — BP 132/64 | HR 68 | Temp 98.2°F | Resp 16 | Wt 222.0 lb

## 2015-08-10 DIAGNOSIS — E119 Type 2 diabetes mellitus without complications: Secondary | ICD-10-CM

## 2015-08-10 DIAGNOSIS — R809 Proteinuria, unspecified: Secondary | ICD-10-CM

## 2015-08-10 DIAGNOSIS — J849 Interstitial pulmonary disease, unspecified: Secondary | ICD-10-CM

## 2015-08-10 DIAGNOSIS — I10 Essential (primary) hypertension: Secondary | ICD-10-CM | POA: Diagnosis not present

## 2015-08-10 DIAGNOSIS — IMO0001 Reserved for inherently not codable concepts without codable children: Secondary | ICD-10-CM

## 2015-08-10 LAB — POCT GLYCOSYLATED HEMOGLOBIN (HGB A1C)
Est. average glucose Bld gHb Est-mCnc: 180
Hemoglobin A1C: 7.9

## 2015-08-10 MED ORDER — GLUCOSE BLOOD VI STRP: ORAL_STRIP | Status: DC

## 2015-08-10 NOTE — Progress Notes (Signed)
Subjective:    Patient ID: Jeremiah Gibson, male    DOB: 1935-04-22, 80 y.o.   MRN: [id number]  HPI   Hypertension, follow-up:  BP Readings from Last 3 Encounters:  08/10/15 132/64  08/06/15 150/80  07/06/15 146/84    He was last seen for hypertension 3 months ago.  BP at that visit was 118/72. Management since that visit includes none. He reports excellent compliance with treatment. He is not having side effects. He is exercising. Pt goes to [employer] twice weekly, and walks track once weekly. He is adherent to low salt diet.   He is experiencing none.  Patient denies chest pain, dyspnea, exertional chest pressure/discomfort, fatigue, irregular heart beat, lower extremity edema, near-syncope, orthopnea, palpitations and syncope.   Cardiovascular risk factors include advanced age (older than 5 for men, 73 for women), diabetes mellitus, dyslipidemia, hypertension, male gender and microalbuminuria.     Weight trend: stable Wt Readings from Last 3 Encounters:  08/10/15 222 lb (100.699 kg)  08/06/15 221 lb (100.245 kg)  07/06/15 220 lb (99.791 kg)    Current diet: in general, a "healthy" diet    ------------------------------------------------------------------------   Diabetes Mellitus Type II, Follow-up:   Lab Results  Component Value Date   HGBA1C 7.9 08/10/2015   HGBA1C 7.8 05/11/2015   HGBA1C 7.9 02/09/2015    Last seen for diabetes 3 months ago.  Management since then includes none. He reports excellent compliance with treatment. He is not having side effects.  Current symptoms include polydipsia and have been unchanged. Home blood sugar records: fasting range: 110's-130's; is unchanged even though pt is on Prednisone for cough.  Episodes of hypoglycemia? no   Most Recent Eye Exam: January 2017 Weight trend: stable Current diet: in general, a "healthy" diet   Current exercise: aerobics and walking  Pertinent Labs:    Component Value Date/Time    CHOL 177 05/11/2015 1127   TRIG 178* 05/11/2015 1127   HDL 42 05/11/2015 1127   LDLCALC 99 05/11/2015 1127   CREATININE 1.22 05/11/2015 1127    Wt Readings from Last 3 Encounters:  08/10/15 222 lb (100.699 kg)  08/06/15 221 lb (100.245 kg)  07/06/15 220 lb (99.791 kg)    For pulmonary fibrosis, has been in prednisone and is tapering off. Is being followed by pulmonology.       Review of Systems  Constitutional: Negative for fever, chills, diaphoresis, activity change, appetite change, fatigue and unexpected weight change.  Respiratory: Positive for cough and wheezing. Negative for shortness of breath.   Cardiovascular: Negative for chest pain, palpitations and leg swelling.  Endocrine: Positive for polydipsia. Negative for polyphagia and polyuria.   BP 132/64 mmHg  Pulse 68  Temp(Src) 98.2 F (36.8 C) (Oral)  Resp 16  Wt 222 lb (100.699 kg)   Patient Active Problem List   Diagnosis Date Noted  . Cardiac enlargement 08/04/2014  . DD (diverticular disease) 08/04/2014  . History of colon polyps 08/04/2014  . 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 Shortness of breath 11/27/2012  . Aortic arch atherosclerosis (HCC) 11/27/2012  . Coronary artery disease 11/27/2012  . Hyperlipidemia 11/27/2012  . ILD (interstitial lung disease) (HCC) 10/03/2011  . Cough 07/31/2011  . Airway hyperreactivity 03/22/2009  . Acid reflux 03/22/2009  . Gout 03/22/2009  . 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 Absolute anemia 01/01/2009  . 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 BP (high blood pressure) 01/01/2009  . Hypercholesteremia 01/01/2009  .  Benign fibroma of prostate 01/01/2009   Past Medical History  Diagnosis Date  . Anemia   . Hypertension   . Diverticulosis of colon (without mention of hemorrhage)   . Leg cramps   . Colon polyps   . Type II or unspecified type diabetes mellitus without mention of complication, not stated as  uncontrolled   . Gout   . Impotence of organic origin   . Esophageal reflux   . Asthma   . Hyperlipidemia    Current Outpatient Prescriptions on File Prior to Visit  Medication Sig  . albuterol (VENTOLIN HFA) 108 (90 Base) MCG/ACT inhaler Inhale 2 puffs into the lungs every 4 (four) hours as needed for wheezing or shortness of breath.  . allopurinol (ZYLOPRIM) 100 MG tablet Take 2 tablets (200 mg total) by mouth 2 (two) times daily.  Marland Kitchen. amLODipine (NORVASC) 5 MG tablet Take 1 tablet by mouth  every day for blood  pressure  . aspirin 81 MG tablet Take 81 mg by mouth daily.  . benzonatate (TESSALON) 100 MG capsule Take 1 capsule (100 mg total) by mouth 3 (three) times daily as needed for cough.  . famotidine (PEPCID) 10 MG tablet Take 1 tablet (10 mg total) by mouth daily.  . finasteride (PROSCAR) 5 MG tablet Take 1 tablet by mouth  every day  . fish oil-omega-3 fatty acids 1000 MG capsule Take 1 g by mouth 3 (three) times daily.   . Fluticasone-Salmeterol (ADVAIR DISKUS) 250-50 MCG/DOSE AEPB Inhale 1 puff into the lungs 2 (two) times daily.  Marland Kitchen. glipiZIDE (GLUCOTROL XL) 10 MG 24 hr tablet Take 1 tablet by mouth  daily with food for  diabetes; take with largest meal of the day  . losartan (COZAAR) 100 MG tablet Take 1 tablet by mouth  every day for blood  pressure  . Magnesium Citrate POWD by Does not apply route daily.  . metFORMIN (GLUCOPHAGE) 500 MG tablet Take 1 tablet (500 mg total) by mouth 2 (two) times daily with a meal.  . MULTIPLE VITAMIN PO MULTIVITAMINS (Oral Tablet)  1 Every Day for 0 days  Quantity: 0.00;  Refills: 0   Ordered :06-Jan-2010  [name], [name] ;  Started 22-Mar-2009 Active  . omeprazole (PRILOSEC OTC) 20 MG tablet Take 1 tablet (20 mg total) by mouth daily.  . potassium chloride SA (K-DUR,KLOR-CON) 20 MEQ tablet Take 1 tablet by mouth  daily  . predniSONE (DELTASONE) 5 MG tablet Take 1 tablet (5 mg total) by mouth as directed.  . simvastatin (ZOCOR) 10 MG tablet Take  1 tablet by mouth  every day  . terazosin (HYTRIN) 5 MG capsule Take 1 capsule by mouth  every day for prostate  . triamterene-hydrochlorothiazide (MAXZIDE-25) 37.5-25 MG tablet Take 1 tablet by mouth  Every Day for blood  pressure   No current facility-administered medications on file prior to visit.   No Known Allergies Past Surgical History  Procedure Laterality Date  . Tonsillectomy    . Appendectomy    . Colonoscopy    . Esophagogastroduodenoscopy endoscopy    . Cyst excision     Social History   Social History  . Marital Status: Married    Spouse Name: N/A  . Number of Children: 2  . Years of Education: N/A   Occupational History  . Retired in 2004    Social History Main Topics  . Smoking status: Former Smoker -- 2.00 packs/day for 20 years    Types: Cigarettes, Pipe  Quit date: 11/22/1999  . Smokeless tobacco: Never Used     Comment: quit cigs in 70 and pipe in 01  . Alcohol Use: Yes     Comment: socially  . Drug Use: No  . Sexual Activity: Not on file   Other Topics Concern  . Not on file   Social History Narrative   Family History  Problem Relation Age of Onset  . Lung cancer Mother 77    smoked  . Lung cancer Father 60    smoked  . Heart disease Father          Objective:   Physical Exam  Constitutional: He appears well-developed and well-nourished.  Cardiovascular: Normal rate, regular rhythm and normal heart sounds.   Pulmonary/Chest: Effort normal. No respiratory distress.  Inspiratory crackles present  Psychiatric: He has a normal mood and affect. His behavior is normal.       Assessment & Plan:  1. Essential hypertension Stable. Continue current medications and plan of care.  2. 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 Pt is currently tapering off of Prednisone (prescribed by pulmonology). Am hesitant to make changes until pt has D/C Prednisone. Pt looking into changing practices, secondary to current  PCP relocating. Will FU on DM with new provider in 3 months. Continue current medications and plan of care.  Results for orders placed or performed in visit on 08/10/15  POCT glycosylated hemoglobin (Hb A1C)  Result Value Ref Range   Hemoglobin A1C 7.9    Est. average glucose Bld gHb Est-mCnc 180     - POCT glycosylated hemoglobin (Hb A1C) - glucose blood test strip; Use as instructed. One Touch glucose strips.  Dispense: 100 each; Refill: 12    3. ILD (interstitial lung disease) (HCC) Continue with pulmonologist and continue prednisone.    Patient seen and examined by Leo Grosser, MD, and note scribed by Allene Dillon, CMA. I have reviewed the document for accuracy and completeness and I agree with above. Leo Grosser, MD   Lorie Phenix, MD

## 2015-08-13 ENCOUNTER — Other Ambulatory Visit: Payer: Self-pay | Admitting: Family Medicine

## 2015-08-13 DIAGNOSIS — IMO0001 Reserved for inherently not codable concepts without codable children: Secondary | ICD-10-CM

## 2015-08-13 MED ORDER — GLUCOSE BLOOD VI STRP: ORAL_STRIP | Status: DC

## 2015-08-13 NOTE — Telephone Encounter (Signed)
Pt called saying he needs his diabetes test strips.  One touch.  He says [facility] on [address] says they contacted us needing a "code"  and we have not responded.  He has ran out of strips and them them asap.  Pts cll back is [phone] or [phone]  Thanks Barth Kirkseri

## 2015-08-16 ENCOUNTER — Other Ambulatory Visit: Payer: Self-pay

## 2015-08-16 ENCOUNTER — Telehealth: Payer: Self-pay | Admitting: Pulmonary Disease

## 2015-08-16 DIAGNOSIS — IMO0001 Reserved for inherently not codable concepts without codable children: Secondary | ICD-10-CM

## 2015-08-16 MED ORDER — GLUCOSE BLOOD VI STRP: ORAL_STRIP | Status: DC

## 2015-08-16 NOTE — Telephone Encounter (Signed)
ATC Ashley w/[facility], line busy. wcb

## 2015-08-17 DIAGNOSIS — E119 Type 2 diabetes mellitus without complications: Secondary | ICD-10-CM | POA: Diagnosis not present

## 2015-08-17 NOTE — Telephone Encounter (Signed)
ATC x 2, line busy. wcb

## 2015-08-18 NOTE — Telephone Encounter (Signed)
[phone] ext [phone] Jobe GibbonAshley [facility]

## 2015-08-18 NOTE — Telephone Encounter (Signed)
Attempted to call [facility]. Number provided is ringing busy. Will try back.

## 2015-08-18 NOTE — Telephone Encounter (Signed)
lmtcb x1 for [employer]

## 2015-08-20 NOTE — Telephone Encounter (Signed)
lmomtcb on named VM for [employer]

## 2015-08-23 NOTE — Telephone Encounter (Signed)
lmtcb x3 for Jeremiah Gibson  

## 2015-08-24 NOTE — Telephone Encounter (Signed)
ATC x2, line busy

## 2015-08-25 ENCOUNTER — Ambulatory Visit: Payer: Self-pay | Admitting: Pulmonary Disease

## 2015-08-25 NOTE — Telephone Encounter (Signed)
lmtcb and will close per triage protocol  

## 2015-08-26 ENCOUNTER — Telehealth: Payer: Self-pay | Admitting: Pulmonary Disease

## 2015-08-26 NOTE — Telephone Encounter (Signed)
lmtcb X1 for Jeremiah Gibson 

## 2015-08-27 NOTE — Telephone Encounter (Signed)
lmtcb x2 for Jeremiah Gibson. 

## 2015-08-30 NOTE — Telephone Encounter (Signed)
lmtcb X3 for [employer]

## 2015-08-31 ENCOUNTER — Ambulatory Visit: Payer: Medicare Other | Admitting: Internal Medicine

## 2015-09-01 NOTE — Telephone Encounter (Signed)
Attempted to call Jeremiah Gibson. lmtcb x4. Message will be closed per triage protocol.

## 2015-09-02 ENCOUNTER — Telehealth: Payer: Self-pay | Admitting: Pulmonary Disease

## 2015-09-02 NOTE — Telephone Encounter (Signed)
lmtcb X1 for pt to relay results/recs.  

## 2015-09-03 ENCOUNTER — Telehealth: Payer: Self-pay | Admitting: Pulmonary Disease

## 2015-09-03 NOTE — Telephone Encounter (Signed)
Patient states that since he has been taking OFEV his sugar levels have increased above 200.  Patient says that he is still taking Prednisone and wants to know if he needs to stop the Prednisone while taking OFEV?  Dr. Vassie LollAlva, please advise.

## 2015-09-03 NOTE — Telephone Encounter (Signed)
LMOM TCB x1 for [employer]

## 2015-09-03 NOTE — Telephone Encounter (Signed)
Pt returning call, thinks he is having a reaction to his meds. [phone]

## 2015-09-04 NOTE — Telephone Encounter (Signed)
Sugars high due to rped  Recomm on last OV - decrease prednisone to 5 mg daily, starting June 15-decreased to 5 mg on Monday/Wednesday/Friday  Confirm he has done this  If so, then he can stop

## 2015-09-06 NOTE — Telephone Encounter (Signed)
Left message for Jeremiah Gibson at [facility] to return call.

## 2015-09-06 NOTE — Telephone Encounter (Signed)
Spoke with pt. States that he did decrease his pred to 5mg  a day but didn't know to only take it on M/W/F. He will do this and let us know if his sugars don't improve. Nothing further was needed.

## 2015-09-06 NOTE — Telephone Encounter (Signed)
Patient calling back about his reaction to his medication. He can be reached at [phone]-prm

## 2015-09-07 NOTE — Telephone Encounter (Signed)
LMTCB for Jeremiah Gibson.  

## 2015-09-07 NOTE — Telephone Encounter (Signed)
Morrie Sheldonshley from [facility] returning call - She can be reached at [phone] ext [phone]-[phone]

## 2015-09-08 NOTE — Telephone Encounter (Signed)
lmtcb x2 for [place].

## 2015-09-09 NOTE — Telephone Encounter (Signed)
lmtcb x3 for Jeremiah Gibson  

## 2015-09-10 NOTE — Telephone Encounter (Signed)
Attempted to contact Jeremiah Gibson again this morning. lmtcb x4. Message will be closed per triage protocol.

## 2015-09-13 ENCOUNTER — Telehealth: Payer: Self-pay | Admitting: Pulmonary Disease

## 2015-09-13 NOTE — Telephone Encounter (Signed)
Received call from Jari Favrescar at [facility] with questions [facility]. More information needed.  Gave information requested. Jari FavreOscar stated that PA results should be processed within the next 30 minutes. Awaiting results.

## 2015-09-13 NOTE — Telephone Encounter (Signed)
Called CMM back and spoke with NewtonStephanie.   She said that she received a request from patient for PA.   Faxed us a form for PA and has not received it back. Checked fax, form was on fax.  Judeth CornfieldStephanie advised me to do PA on CMM instead of faxing form back. Sent through [facility]:  Status: PA Request - Sent to Plan on July 3rd, 2017  Drug: Ofev 150MG  capsules  Form: [employer] [place] Commercial Electronic Request Form (CB)  Your information has been submitted to [employer] of [place]. [employer] will review the request and fax you a determination directly, typically within 3 business days of your submission once all necessary information is received. If [employer] has not responded in 3 business days or if you have any questions about your submission, contact BCBSNC at [phone].  Awaiting response. Will hold in my box until completed.

## 2015-09-15 NOTE — Telephone Encounter (Signed)
Attempted to call [employer]BS. Was placed on a long hold. Will need to call back.

## 2015-09-16 NOTE — Telephone Encounter (Signed)
Spoke with Tommie H. at [employer]. OFEV has been denied. An appeal can be completed if deemed necessary.  RA - please advise. Thanks.

## 2015-09-20 NOTE — Telephone Encounter (Signed)
Please send a letter to [facility]  Reference- appeal for denial of Ofev  Mr. Jeremiah Gibson had a high resolution CT done in 07/2015 which showed idiopathic pulmonary fibrosis. He does have symptoms compatible with this and should be started on anti-fibrotic therapy. He does not have an alternative explanation for the fibrosis. I'm requesting  to review your decision regarding Ofev denial  Sincerely   Cyril Mourningakesh Alva MD  Please fax to [phone]  This can be followed up with a phone call in one week to [phone]

## 2015-09-21 NOTE — Telephone Encounter (Signed)
Letter faxed to [facility].  Will hold in box until completed.

## 2015-09-23 NOTE — Telephone Encounter (Signed)
Appeal is still in process. Will cont to hold

## 2015-09-27 NOTE — Telephone Encounter (Signed)
Calling stating that ofev was denied.Caren GriffinsStanley A Dalton

## 2015-09-27 NOTE — Telephone Encounter (Signed)
Spoke with Thais at [facility] and she needed to clarify if pt has had a lung biopsy.  We do not have record of pt having lung biopsy since we have been seeing him since 08/2011.  She will report this info to review board and will get back with us on decision.

## 2015-09-27 NOTE — Telephone Encounter (Signed)
BCBS  Calling w/questions from pharm about PA olease advise [phone] says this is and urgent request.Jeremiah Gibson

## 2015-09-27 NOTE — Telephone Encounter (Signed)
[phone] BCBS Jeremiah Gibson is calling has some questions

## 2015-09-27 NOTE — Telephone Encounter (Signed)
Denial letter placed in Dr. Reginia NaasAlva's look at folder.

## 2015-09-27 NOTE — Telephone Encounter (Signed)
Called spoke with Thais. Was advised the appeal was denied. Reason for denial is the criteria for this medication requires pt to a surgical lung biopsy. Please advise Dr. Vassie LollAlva thanks

## 2015-09-28 NOTE — Telephone Encounter (Signed)
Please file  a   Re :statement to  appeal denial of OFEV for Jeremiah Gibson  Mr. Jeremiah Gibson has a pattern suggestive of usual interstitial pneumonia on high-resolution CT. This pattern in his age group is very typical of idiopathic pulmonary fibrosis. His history and progression over the last year also supports this diagnosis. I do not feel as a screening by neurologist at the surgical lung biopsy is necessary. I believe this is in accordance with current practice guidelines for idiopathic pulmonary fibrosis.  I request you to please authorize coverage for Ofev 150 mg twice daily

## 2015-09-29 NOTE — Telephone Encounter (Signed)
Letter created and faxed to [employer]) for appeals - [phone] Will call tomorrow to check status of appeal - phone number: [phone] HOLD in my box until I close encounter

## 2015-10-06 NOTE — Telephone Encounter (Signed)
Called Jeremiah Gibson and patient's OFEV has been approved. Called [facility], they will be sending out medication to patient. Called and left message on voicemail for patient to call back.

## 2015-10-06 NOTE — Telephone Encounter (Signed)
Marcelino Duster can you give an update on this appeal?

## 2015-10-06 NOTE — Telephone Encounter (Signed)
Spoke with pt and advised that Durel Salts has been approved. Pt is concerned about cost and he has not heard from [facility]. Spoke with Mikle Bosworth at [employer] and they have not received approval notice from pt's ins yet, once they have received they will reach out to pt and discuss cost. Spoke with pt again and let him know that [facility] will be contacting him, also let him know about patient assistance program. He will call us back if he needs to discuss pt assistance. Nothing further needed at this time.

## 2015-10-06 NOTE — Telephone Encounter (Signed)
Pt calling stating that he is [facility] to [facility], please advise.Caren Griffins

## 2015-10-11 ENCOUNTER — Other Ambulatory Visit (INDEPENDENT_AMBULATORY_CARE_PROVIDER_SITE_OTHER): Payer: Medicare Other

## 2015-10-11 ENCOUNTER — Encounter: Payer: Self-pay | Admitting: Pulmonary Disease

## 2015-10-11 ENCOUNTER — Ambulatory Visit (INDEPENDENT_AMBULATORY_CARE_PROVIDER_SITE_OTHER): Payer: Medicare Other | Admitting: Pulmonary Disease

## 2015-10-11 DIAGNOSIS — J849 Interstitial pulmonary disease, unspecified: Secondary | ICD-10-CM

## 2015-10-11 DIAGNOSIS — J4530 Mild persistent asthma, uncomplicated: Secondary | ICD-10-CM

## 2015-10-11 LAB — HEPATIC FUNCTION PANEL
ALK PHOS: 83 U/L (ref 39–117)
ALT: 27 U/L (ref 0–53)
AST: 19 U/L (ref 0–37)
Albumin: 4.3 g/dL (ref 3.5–5.2)
BILIRUBIN DIRECT: 0.1 mg/dL (ref 0.0–0.3)
Total Bilirubin: 0.7 mg/dL (ref 0.2–1.2)
Total Protein: 7.9 g/dL (ref 6.0–8.3)

## 2015-10-11 NOTE — Patient Instructions (Signed)
We discussed natural course and progression of IPF You are tolerating OFEV well  Decrease prednisone to Monday/Friday until then Okay to start pulmonary [facility]  Liver function tests today-then check every month x2 in chair PCP office

## 2015-10-11 NOTE — Assessment & Plan Note (Signed)
We discussed natural course and progression of IPF You are tolerating OFEV well  Decrease prednisone to Monday/Friday until done Okay to start pulmonary rehabilitation at [facility]  Liver function tests today-then check every month x2 in chair PCP office

## 2015-10-11 NOTE — Progress Notes (Signed)
Subjective:    Patient ID: Jeremiah Gibson, male    DOB: [birth year], 80 y.o.   MRN: [id number]  HPI   80 y.o remote ex smoker for FU of cough variant asthma  & ILD  He moved to [place] from [place], [place] In 2010. He was evaluated by Pulmonology in [place] in 2008 for cough & wheezing & diagnosed with asthma. Review of his spirometry records essentially show mild restriction with FEV1 in the 72% range and normal FEV1 ratio. However spirometry from 08/2006 does show significant bronchodilator response which could be consistent with some reversible disease. On this basis, he was maintained on a regimen of Advair 250/50 twice a day and albuterol as needed for rescue . Allergy testing was negative.  He also has diabetes hyperlipidemia and hypertension.  He smoked from his teenage years upto age 7. He worked in [employer] and was 6 blocks from the [employer] on [place]11.   10/11/2015  Chief Complaint  Patient presents with  . Follow-up    doing well.  discuss pulmonary therapy at [facility] and getting some blood tests done.  BS has been around 140-170 on prednisone. Still having to clear his throat all the time, cough is better.    FU Accompanied by his wife-they have several questions  He was started on 10 mg prednisone 06/2015  For worsening cough and this helped, his cough is  Gone He is concerned about side effects of prednisone   His dyspnea has not worsened He denies pedal edema or frequent chest colds  He was empirically treated for upper airway cough & GERD With  60% improvement  He wonders if Advair has stopped working and complains about cost  We reviewed  CT scan and pulmonary function tests again  Significant tests/ events  2013 HRCT >>Mild subpleural reticulation/fibrosis, nonspecific. No definite honeycombing . Mild bilateral lower lobe bronchiectasis   HRCT 07/2015 >> UIP pattern, worse  RAST -neg  Short course of prednisone - helped  Trial of  dexilant - seemed to be better than omeprazole , but he prefers generics.  PFTs 09/2011 - no airway obstruction, nml lung volumes, DLCO 64%   He underwent evaluation for [facility] cough  - labs ok, CXR clear, spirometry  -no obstruction PFTs 11/2012 -slight drop in FVC from 3.8 to 3.5, DLCO increased to 17.9 (57%), TLC dropped to 4.95(76%)  PFT 07/2015 FVC 86%, TLC 67%, DLCO 57% (stable)   Past Medical History:  Diagnosis Date  . Anemia   . Asthma   . Colon polyps   . Diverticulosis of colon (without mention of hemorrhage)   . Esophageal reflux   . Gout   . Hyperlipidemia   . Hypertension   . Impotence of organic origin   . Leg cramps   . Type II or unspecified type diabetes mellitus without mention of complication, not stated as uncontrolled      Review of Systems neg for any significant sore throat, dysphagia, itching, sneezing, nasal congestion or excess/ purulent secretions, fever, chills, sweats, unintended wt loss, pleuritic or exertional cp, hempoptysis, orthopnea pnd or change in chronic leg swelling. Also denies presyncope, palpitations, heartburn, abdominal pain, nausea, vomiting, diarrhea or change in bowel or urinary habits, dysuria,hematuria, rash, arthralgias, visual complaints, headache, numbness weakness or ataxia.     Objective:   Physical Exam  Gen. Pleasant, well-nourished, in no distress ENT - no lesions, no post nasal drip Neck: No JVD, no thyromegaly, no carotid bruits  Lungs: no use of accessory muscles, no dullness to percussion, bibasal rales , no rhonchi  Cardiovascular: Rhythm regular, heart sounds  normal, no murmurs or gallops, no peripheral edema Musculoskeletal: No deformities, no cyanosis or clubbing        Assessment & Plan:

## 2015-10-11 NOTE — Addendum Note (Signed)
Addended by: York Ram on: 10/11/2015 10:42 AM   Modules accepted: Orders

## 2015-10-11 NOTE — Assessment & Plan Note (Signed)
Ct advair 

## 2015-11-23 ENCOUNTER — Encounter: Payer: Medicare Other | Attending: Pulmonary Disease | Admitting: Respiratory Therapy

## 2015-11-23 VITALS — Ht 70.0 in | Wt 214.5 lb

## 2015-11-23 DIAGNOSIS — J4530 Mild persistent asthma, uncomplicated: Secondary | ICD-10-CM | POA: Insufficient documentation

## 2015-11-23 DIAGNOSIS — J849 Interstitial pulmonary disease, unspecified: Secondary | ICD-10-CM | POA: Insufficient documentation

## 2015-11-23 DIAGNOSIS — J8410 Pulmonary fibrosis, unspecified: Secondary | ICD-10-CM

## 2015-11-23 NOTE — Progress Notes (Signed)
Pulmonary Individual Treatment Plan  Patient Details  Name: Jeremiah Gibson MRN: [id number] Date of Birth: [birth year] Referring Provider:   Flowsheet Row Pulmonary Rehab from 11/23/2015 in [facility] Cardiac and Pulmonary Rehab  Referring Provider  Vassie Loll      Initial Encounter Date:  Flowsheet Row Pulmonary Rehab from 11/23/2015 in [facility] Cardiac and Pulmonary Rehab  Date  11/23/15  Referring Provider  Vassie Loll      Visit Diagnosis: No diagnosis found.  Patient's Home Medications on Admission:  Current Outpatient Prescriptions:    albuterol (VENTOLIN HFA) 108 (90 Base) MCG/ACT inhaler, Inhale 2 puffs into the lungs every 4 (four) hours as needed for wheezing or shortness of breath., Disp: 3 Inhaler, Rfl: 3   allopurinol (ZYLOPRIM) 100 MG tablet, Take 2 tablets (200 mg total) by mouth 2 (two) times daily., Disp: 360 tablet, Rfl: 3   amLODipine (NORVASC) 5 MG tablet, Take 1 tablet by mouth  every day for blood  pressure, Disp: 90 tablet, Rfl: 3   aspirin 81 MG tablet, Take 81 mg by mouth daily., Disp: , Rfl:    benzonatate (TESSALON) 100 MG capsule, Take 1 capsule (100 mg total) by mouth 3 (three) times daily as needed for cough., Disp: 60 capsule, Rfl: 0   famotidine (PEPCID) 10 MG tablet, Take 1 tablet (10 mg total) by mouth daily., Disp: 90 tablet, Rfl: 3   finasteride (PROSCAR) 5 MG tablet, Take 1 tablet by mouth  every day, Disp: 90 tablet, Rfl: 3   fish oil-omega-3 fatty acids 1000 MG capsule, Take 1 g by mouth 3 (three) times daily. , Disp: , Rfl:    Fluticasone-Salmeterol (ADVAIR DISKUS) 250-50 MCG/DOSE AEPB, Inhale 1 puff into the lungs 2 (two) times daily., Disp: 14 each, Rfl: 0   glipiZIDE (GLUCOTROL XL) 10 MG 24 hr tablet, Take 1 tablet by mouth  daily with food for  diabetes; take with largest meal of the day, Disp: 90 tablet, Rfl: 3   glucose blood test strip, Check sugar once daily. One Touch glucose strips. Dx: E11.9, Disp: 50 each, Rfl: 12   losartan (COZAAR) 100 MG  tablet, Take 1 tablet by mouth  every day for blood  pressure, Disp: 90 tablet, Rfl: 3   Magnesium Citrate POWD, by Does not apply route daily., Disp: , Rfl:    metFORMIN (GLUCOPHAGE) 500 MG tablet, Take 1 tablet (500 mg total) by mouth 2 (two) times daily with a meal., Disp: 180 tablet, Rfl: 1   MULTIPLE VITAMIN PO, MULTIVITAMINS (Oral Tablet)  1 Every Day for 0 days  Quantity: 0.00;  Refills: 0   Ordered :06-Jan-2010  [name] ;  Started 22-Mar-2009 Active, Disp: , Rfl:    Nintedanib (OFEV) 100 MG CAPS, Take 100 mg by mouth 2 (two) times daily., Disp: , Rfl:    omeprazole (PRILOSEC OTC) 20 MG tablet, Take 1 tablet (20 mg total) by mouth daily., Disp: 90 tablet, Rfl: 3   potassium chloride SA (K-DUR,KLOR-CON) 20 MEQ tablet, Take 1 tablet by mouth  daily, Disp: 90 tablet, Rfl: 3   predniSONE (DELTASONE) 5 MG tablet, Take 1 tablet (5 mg total) by mouth as directed., Disp: 60 tablet, Rfl: 0   simvastatin (ZOCOR) 10 MG tablet, Take 1 tablet by mouth  every day, Disp: 90 tablet, Rfl: 3   terazosin (HYTRIN) 5 MG capsule, Take 1 capsule by mouth  every day for prostate, Disp: 90 capsule, Rfl: 3   triamterene-hydrochlorothiazide (MAXZIDE-25) 37.5-25 MG tablet, Take 1 tablet by mouth  Every Day for blood  pressure, Disp: 90 tablet, Rfl: 3  Past Medical History: Past Medical History:  Diagnosis Date   Anemia    Asthma    Colon polyps    Diverticulosis of colon (without mention of hemorrhage)    Esophageal reflux    Gout    Hyperlipidemia    Hypertension    Impotence of organic origin    Leg cramps    Type II or unspecified type diabetes mellitus without mention of complication, not stated as uncontrolled     Tobacco Use: History  Smoking Status   Former Smoker   Packs/day: 2.00   Years: 20.00   Types: Cigarettes, Pipe   Quit date: 11/22/1999  Smokeless Tobacco   Never Used    Comment: quit cigs in 72 and pipe in 01    Labs: Recent Review Flowsheet  Data    Labs for ITP Cardiac and Pulmonary Rehab Latest Ref Rng & Units 11/04/2014 02/09/2015 05/11/2015 08/10/2015   Cholestrol 100 - 199 mg/dL - - 161 -   LDLCALC 0 - 99 mg/dL - - 99 -   HDL >09 mg/dL - - 42 -   Trlycerides 0 - 149 mg/dL - - 604(V) -   Hemoglobin A1c - 7.7 7.9 7.8 7.9       ADL UCSD:     Pulmonary Assessment Scores    Row Name 11/23/15 1311         ADL UCSD   ADL Phase Entry     SOB Score total 8     Rest 0     Walk 0     Stairs 1     Bath 0     Dress 0     Shop 0        Pulmonary Function Assessment:     Pulmonary Function Assessment - 11/23/15 1310      Initial Spirometry Results   FVC% 86 %   FEV1% 98 %   FEV1/FVC Ratio 82   Comments Test date 07/20/15     Post Bronchodilator Spirometry Results   FVC% 81 %   FEV1% 93 %   FEV1/FVC Ratio 82     Breath   Bilateral Breath Sounds Basilar;Rales   Shortness of Breath Yes      Exercise Target Goals: Date: 11/23/15  Exercise Program Goal: Individual exercise prescription set with THRR, safety & activity barriers. Participant demonstrates ability to understand and report RPE using BORG scale, to self-measure pulse accurately, and to acknowledge the importance of the exercise prescription.  Exercise Prescription Goal: Starting with aerobic activity 30 plus minutes a day, 3 days per week for initial exercise prescription. Provide home exercise prescription and guidelines that participant acknowledges understanding prior to discharge.  Activity Barriers & Risk Stratification:     Activity Barriers & Cardiac Risk Stratification - 11/23/15 1309      Activity Barriers & Cardiac Risk Stratification   Activity Barriers Shortness of Breath;Deconditioning   Cardiac Risk Stratification Moderate      6 Minute Walk:     6 Minute Walk    Row Name 11/23/15 1236         6 Minute Walk   Distance 1320 feet     Walk Time 6 minutes     MPH 2.5     METS 2.49     RPE 13     Perceived Dyspnea  3      VO2 Peak 8.7     Symptoms  No     Resting HR 85 bpm     Resting BP 124/76     Max Ex. HR 105 bpm     Max Ex. BP 144/64       Interval HR   Baseline HR 85     1 Minute HR 92     2 Minute HR 93     3 Minute HR 102     4 Minute HR 103     5 Minute HR 103     6 Minute HR 105     2 Minute Post HR 96     Interval Heart Rate? Yes       Interval Oxygen   Interval Oxygen? Yes     Baseline Oxygen Saturation % 97 %     1 Minute Oxygen Saturation % 93 %     2 Minute Oxygen Saturation % 88 %     3 Minute Oxygen Saturation % 89 %     4 Minute Oxygen Saturation % 90 %     5 Minute Oxygen Saturation % 91 %     6 Minute Oxygen Saturation % 93 %     2 Minute Post Oxygen Saturation % 96 %        Initial Exercise Prescription:     Initial Exercise Prescription - 11/23/15 1200      Date of Initial Exercise RX and Referring Provider   Date 11/23/15   Referring Provider Vassie Loll     Treadmill   MPH 1.7   Grade 0.5   Minutes 15   METs 2.42     Recumbant Bike   Level 1   RPM 60   Minutes 15   METs 2.3     NuStep   Level 2   Minutes 15   METs 2     T5 Nustep   Level 1   Minutes 15   METs 2     Biostep-RELP   Level 2   Minutes 15   METs 2     Prescription Details   Frequency (times per week) 3   Duration Progress to 45 minutes of aerobic exercise without signs/symptoms of physical distress     Intensity   THRR 40-80% of Max Heartrate 107-130   Ratings of Perceived Exertion 11-13   Perceived Dyspnea 0-4     Progression   Progression Continue to progress workloads to maintain intensity without signs/symptoms of physical distress.     Resistance Training   Training Prescription Yes   Weight 2   Reps 10-15      Perform Capillary Blood Glucose checks as needed.  Exercise Prescription Changes:   Exercise Comments:   Discharge Exercise Prescription (Final Exercise Prescription Changes):    Nutrition:  Target Goals: Understanding of nutrition guidelines,  daily intake of sodium 1500mg , cholesterol 200mg , calories 30% from fat and 7% or less from saturated fats, daily to have 5 or more servings of fruits and vegetables.  Biometrics:     Pre Biometrics - 11/23/15 1234      Pre Biometrics   Height 5\' 10"  (1.778 m)   Weight 214 lb 8 oz (97.3 kg)   Waist Circumference 44 inches   Hip Circumference 43 inches   Waist to Hip Ratio 1.02 %   BMI (Calculated) 30.8       Nutrition Therapy Plan and Nutrition Goals:   Nutrition Discharge: Rate Your Plate Scores:   Psychosocial: Target Goals: Acknowledge presence or absence of  depression, maximize coping skills, provide positive support system. Participant is able to verbalize types and ability to use techniques and skills needed for reducing stress and depression.  Initial Review & Psychosocial Screening:     Initial Psych Review & Screening - 11/23/15 1332      Family Dynamics   Good Support System? Yes   Comments Mr Shon BatonBrooks has great support from his wife. He has accepted his pulmonary fibrosis and is very positive about managing the disease.     Barriers   Psychosocial barriers to participate in program The patient should benefit from training in stress management and relaxation.     Screening Interventions   Interventions Encouraged to exercise      Quality of Life Scores:     Quality of Life - 11/23/15 1333      Quality of Life Scores   Health/Function Pre 20.19 %   Socioeconomic Pre 20.4 %   Psych/Spiritual Pre 20.29 %   Family Pre 20 %   GLOBAL Pre 20.21 %      PHQ-9: Recent Review Flowsheet Data    [facility]   Decreased Interest 0   Down, Depressed, Hopeless 0   PHQ - 2 Score 0   Altered sleeping 0   Tired, decreased energy 0   Change in appetite 0   Feeling bad or failure about yourself  0   Trouble concentrating 0   Moving slowly or fidgety/restless 0   Suicidal thoughts 0   PHQ-9 Score 0      Psychosocial Evaluation and  Intervention:   Psychosocial Re-Evaluation:  Education: Education Goals: Education classes will be provided on a weekly basis, covering required topics. Participant will state understanding/return demonstration of topics presented.  Learning Barriers/Preferences:     Learning Barriers/Preferences - 11/23/15 1310      Learning Barriers/Preferences   Learning Barriers None   Learning Preferences Group Instruction;Individual Instruction;Pictoral;Skilled Demonstration;Verbal Instruction;Video;Written Material      Education Topics: Initial Evaluation Education: - Verbal, written and demonstration of respiratory meds, RPE/PD scales, oximetry and breathing techniques. Instruction on use of nebulizers and MDIs: cleaning and proper use, rinsing mouth with steroid doses and importance of monitoring MDI activations. Flowsheet Row [facility]monary [facility]  Date  11/23/15  Educator  LB  Instruction Review Code  2- meets goals/outcomes      General Nutrition Guidelines/Fats and Fiber: -Group instruction provided by verbal, written material, models and posters to present the general guidelines for heart healthy nutrition. Gives an explanation and review of dietary fats and fiber.   Controlling Sodium/Reading Food Labels: -Group verbal and written material supporting the discussion of sodium use in heart healthy nutrition. Review and explanation with models, verbal and written materials for utilization of the food label.   Exercise Physiology & Risk Factors: - Group verbal and written instruction with models to review the exercise physiology of the cardiovascular system and associated critical values. Details cardiovascular disease risk factors and the goals associated with each risk factor.   Aerobic Exercise & Resistance Training: - Gives group verbal and written discussion on the health impact of inactivity. On the components of aerobic and  resistive training programs and the benefits of this training and how to safely progress through these programs.   Flexibility, Balance, General Exercise Guidelines: - Provides group verbal and written instruction on the benefits of flexibility and balance training programs. Provides general exercise guidelines with specific guidelines to those  with heart or lung disease. Demonstration and skill practice provided.   Stress Management: - Provides group verbal and written instruction about the health risks of elevated stress, cause of high stress, and healthy ways to reduce stress.   Depression: - Provides group verbal and written instruction on the correlation between heart/lung disease and depressed mood, treatment options, and the stigmas associated with seeking treatment.   Exercise & Equipment Safety: - Individual verbal instruction and demonstration of equipment use and safety with use of the equipment.   Infection Prevention: - Provides verbal and written material to individual with discussion of infection control including proper hand washing and proper equipment cleaning during exercise session. Flowsheet Row Pulmonary Rehab from 11/23/2015 in [facility] Cardiac and Pulmonary Rehab  Date  11/23/15  Educator  LB  Instruction Review Code  2- meets goals/outcomes      Falls Prevention: - Provides verbal and written material to individual with discussion of falls prevention and safety. Flowsheet Row Pulmonary Rehab from 11/23/2015 in [facility] Cardiac and Pulmonary Rehab  Date  11/23/15  Educator  LB  Instruction Review Code  2- meets goals/outcomes      Diabetes: - Individual verbal and written instruction to review signs/symptoms of diabetes, desired ranges of glucose level fasting, after meals and with exercise. Advice that pre and post exercise glucose checks will be done for 3 sessions at entry of program. Flowsheet Row Pulmonary Rehab from 11/23/2015 in [facility] Cardiac and Pulmonary  Rehab  Date  11/23/15  Educator  LB  Instruction Review Code  2- meets goals/outcomes      Chronic Lung Diseases: - Group verbal and written instruction to review new updates, new respiratory medications, new advancements in procedures and treatments. Provide informative websites and "800" numbers of self-education.   Lung Procedures: - Group verbal and written instruction to describe testing methods done to diagnose lung disease. Review the outcome of test results. Describe the treatment choices: Pulmonary Function Tests, ABGs and oximetry.   Energy Conservation: - Provide group verbal and written instruction for methods to conserve energy, plan and organize activities. Instruct on pacing techniques, use of adaptive equipment and posture/positioning to relieve shortness of breath.   Triggers: - Group verbal and written instruction to review types of environmental controls: home humidity, furnaces, filters, dust mite/pet prevention, HEPA vacuums. To discuss weather changes, air quality and the benefits of nasal washing.   Exacerbations: - Group verbal and written instruction to provide: warning signs, infection symptoms, calling MD promptly, preventive modes, and value of vaccinations. Review: effective airway clearance, coughing and/or vibration techniques. Create an Sport and exercise psychologist.   Oxygen: - Individual and group verbal and written instruction on oxygen therapy. Includes supplement oxygen, available portable oxygen systems, continuous and intermittent flow rates, oxygen safety, concentrators, and Medicare reimbursement for oxygen.   Respiratory Medications: - Group verbal and written instruction to review medications for lung disease. Drug class, frequency, complications, importance of spacers, rinsing mouth after steroid MDI's, and proper cleaning methods for nebulizers. Flowsheet Row Pulmonary Rehab from 11/23/2015 in [facility] Cardiac and Pulmonary Rehab  Date  11/23/15  Educator  LB   Instruction Review Code  2- meets goals/outcomes      AED/CPR: - Group verbal and written instruction with the use of models to demonstrate the basic use of the AED with the basic ABC's of resuscitation.   Breathing Retraining: - Provides individuals verbal and written instruction on purpose, frequency, and proper technique of diaphragmatic breathing and pursed-lipped breathing. Applies individual practice  skills. Flowsheet Row Pulmonary Rehab [facility] 11/23/2015 [facility] Cardiac and Pulmonary Rehab  Date  11/23/15  Educator  LB  Instruction Review Code  2- meets goals/outcomes      Anatomy and Physiology of the Lungs: - Group verbal and written instruction with the use of models to provide basic lung anatomy and physiology related to function, structure and complications of lung disease.   Heart Failure: - Group verbal and written instruction on the basics of heart failure: signs/symptoms, treatments, explanation of ejection fraction, enlarged heart and cardiomyopathy.   Sleep Apnea: - Individual verbal and written instruction to review Obstructive Sleep Apnea. Review of risk factors, methods for diagnosing and types of masks and machines for OSA.   Anxiety: - Provides group, verbal and written instruction on the correlation between heart/lung disease and anxiety, treatment options, and management of anxiety.   Relaxation: - Provides group, verbal and written instruction about the benefits of relaxation for patients with heart/lung disease. Also provides patients with examples of relaxation techniques.   Knowledge Questionnaire Score:     Knowledge Questionnaire Score - 11/23/15 1310      Knowledge Questionnaire Score   Pre Score 8/10       Core Components/Risk Factors/Patient Goals at Admission:     Personal Goals and Risk Factors at Admission - 11/23/15 1319      Core Components/Risk Factors/Patient Goals on Admission    Weight Management Yes   Intervention Weight  Management: Develop a combined nutrition and exercise program designed to reach desired caloric intake, while maintaining appropriate intake of nutrient and fiber, sodium and fats, and appropriate energy expenditure required for the weight goal.  Mr Edgecombe prefers not to meet with the dietitian. His wife is a good , healthy cook. He does like bread, but not sweets.   Admit Weight 214 lb 6.4 oz (97.3 kg)   Goal Weight: Short Term 209 lb (94.8 kg)   Goal Weight: Long Term 204 lb (92.5 kg)   Expected Outcomes Understanding of distribution of calorie intake throughout the day with the consumption of 4-5 meals/snacks   Sedentary Yes   Intervention Provide advice, education, support and counseling about physical activity/exercise needs.;Develop an individualized exercise prescription for aerobic and resistive training based on initial evaluation findings, risk stratification, comorbidities and participant's personal goals.  He does the [employer] class twice a week..   Expected Outcomes Achievement of increased cardiorespiratory fitness and enhanced flexibility, muscular endurance and strength shown through measurements of functional capacity and personal statement of participant.   Increase Strength and Stamina Yes   Intervention Provide advice, education, support and counseling about physical activity/exercise needs.;Develop an individualized exercise prescription for aerobic and resistive training based on initial evaluation findings, risk stratification, comorbidities and participant's personal goals.   Expected Outcomes Achievement of increased cardiorespiratory fitness and enhanced flexibility, muscular endurance and strength shown through measurements of functional capacity and personal statement of participant.   Improve shortness of breath with ADL's Yes   Intervention Provide education, individualized exercise plan and daily activity instruction to help decrease symptoms of SOB with activities  of daily living.   Expected Outcomes Short Term: Achieves a reduction of symptoms when performing activities of daily living.   Develop more efficient breathing techniques such as purse lipped breathing and diaphragmatic breathing; and practicing self-pacing with activity Yes   Intervention Provide education, demonstration and support about specific breathing techniuqes utilized for more efficient breathing. Include techniques such as pursed lipped breathing, diaphragmatic breathing and  self-pacing activity.   Expected Outcomes Short Term: Participant will be able to demonstrate and use breathing techniques as needed throughout daily activities.   Increase knowledge of respiratory medications and ability to use respiratory devices properly  Yes   Intervention Provide education and demonstration as needed of appropriate use of medications, inhalers, and oxygen therapy.  Advair and Proventil; spacer given   Expected Outcomes Short Term: Achieves understanding of medications use. Understands that oxygen is a medication prescribed by physician. Demonstrates appropriate use of inhaler and oxygen therapy.   Diabetes Yes   Intervention Provide education about signs/symptoms and action to take for hypo/hyperglycemia.;Provide education about proper nutrition, including hydration, and aerobic/resistive exercise prescription along with prescribed medications to achieve blood glucose in normal ranges: Fasting glucose 65-99 mg/dL   Expected Outcomes Short Term: Participant verbalizes understanding of the signs/symptoms and immediate care of hyper/hypoglycemia, proper foot care and importance of medication, aerobic/resistive exercise and nutrition plan for blood glucose control.;Long Term: Attainment of HbA1C < 7%.   Hypertension Yes   Intervention Provide education on lifestyle modifcations including regular physical activity/exercise, weight management, moderate sodium restriction and increased consumption of fresh  fruit, vegetables, and low fat dairy, alcohol moderation, and smoking cessation.;Monitor prescription use compliance.   Expected Outcomes Short Term: Continued assessment and intervention until BP is < 140/73mm HG in hypertensive participants. < 130/33mm HG in hypertensive participants with diabetes, heart failure or chronic kidney disease.;Long Term: Maintenance of blood pressure at goal levels.      Core Components/Risk Factors/Patient Goals Review:    Core Components/Risk Factors/Patient Goals at Discharge (Final Review):    ITP Comments:   Comments: Mr Buening plans to start [facility] on 11/29/15 and attend 3 days/week.

## 2015-11-23 NOTE — Patient Instructions (Signed)
Patient Instructions  Patient Details  Name: Jeremiah Gibson MRN: [id number] Date of Birth: March 28, 1935 Referring Provider:  Oretha MilchAlva, Rakesh V, MD  Below are the personal goals you chose as well as exercise and nutrition goals. Our goal is to help you keep on track towards obtaining and maintaining your goals. We will be discussing your progress on these goals with you throughout the program.  Initial Exercise Prescription:     Initial Exercise Prescription - 11/23/15 1200      Date of Initial Exercise RX and Referring Provider   Date 11/23/15   Referring Provider Vassie LollAlva     Treadmill   MPH 1.7   Grade 0.5   Minutes 15   METs 2.42     Recumbant Bike   Level 1   RPM 60   Minutes 15   METs 2.3     NuStep   Level 2   Minutes 15   METs 2     T5 Nustep   Level 1   Minutes 15   METs 2     Biostep-RELP   Level 2   Minutes 15   METs 2     Prescription Details   Frequency (times per week) 3   Duration Progress to 45 minutes of aerobic exercise without signs/symptoms of physical distress     Intensity   THRR 40-80% of Max Heartrate 107-130   Ratings of Perceived Exertion 11-13   Perceived Dyspnea 0-4     Progression   Progression Continue to progress workloads to maintain intensity without signs/symptoms of physical distress.     Resistance Training   Training Prescription Yes   Weight 2   Reps 10-15      Exercise Goals: Frequency: Be able to perform aerobic exercise three times per week working toward 3-5 days per week.  Intensity: Work with a perceived exertion of 11 (fairly light) - 15 (hard) as tolerated. Follow your new exercise prescription and watch for changes in prescription as you progress with the program. Changes will be reviewed with you when they are made.  Duration: You should be able to do 30 minutes of continuous aerobic exercise in addition to a 5 minute warm-up and a 5 minute cool-down routine.  Nutrition Goals: Your personal nutrition goals  will be established when you do your nutrition analysis with the dietician.  The following are nutrition guidelines to follow: Cholesterol < 200mg /day Sodium < 1500mg /day Fiber: Men over 50 yrs - 30 grams per day  Personal Goals:     Personal Goals and Risk Factors at Admission - 11/23/15 1319      Core Components/Risk Factors/Patient Goals on Admission    Weight Management Yes   Intervention Weight Management: Develop a combined nutrition and exercise program designed to reach desired caloric intake, while maintaining appropriate intake of nutrient and fiber, sodium and fats, and appropriate energy expenditure required for the weight goal.  Jeremiah Gibson prefers not to meet with the dietitian. His wife is a good , healthy cook. He does like bread, but not sweets.   Admit Weight 214 lb 6.4 oz (97.3 kg)   Goal Weight: Short Term 209 lb (94.8 kg)   Goal Weight: Long Term 204 lb (92.5 kg)   Expected Outcomes Understanding of distribution of calorie intake throughout the day with the consumption of 4-5 meals/snacks   Sedentary Yes   Intervention Provide advice, education, support and counseling about physical activity/exercise needs.;Develop an individualized exercise prescription for aerobic and resistive training based  on initial evaluation findings, risk stratification, comorbidities and participant's personal goals.  He does the [employer] class twice a week..   Expected Outcomes Achievement of increased cardiorespiratory fitness and enhanced flexibility, muscular endurance and strength shown through measurements of functional capacity and personal statement of participant.   Increase Strength and Stamina Yes   Intervention Provide advice, education, support and counseling about physical activity/exercise needs.;Develop an individualized exercise prescription for aerobic and resistive training based on initial evaluation findings, risk stratification, comorbidities and participant's  personal goals.   Expected Outcomes Achievement of increased cardiorespiratory fitness and enhanced flexibility, muscular endurance and strength shown through measurements of functional capacity and personal statement of participant.   Improve shortness of breath with ADL's Yes   Intervention Provide education, individualized exercise plan and daily activity instruction to help decrease symptoms of SOB with activities of daily living.   Expected Outcomes Short Term: Achieves a reduction of symptoms when performing activities of daily living.   Develop more efficient breathing techniques such as purse lipped breathing and diaphragmatic breathing; and practicing self-pacing with activity Yes   Intervention Provide education, demonstration and support about specific breathing techniuqes utilized for more efficient breathing. Include techniques such as pursed lipped breathing, diaphragmatic breathing and self-pacing activity.   Expected Outcomes Short Term: Participant will be able to demonstrate and use breathing techniques as needed throughout daily activities.   Increase knowledge of respiratory medications and ability to use respiratory devices properly  Yes   Intervention Provide education and demonstration as needed of appropriate use of medications, inhalers, and oxygen therapy.  Advair and Proventil; spacer given   Expected Outcomes Short Term: Achieves understanding of medications use. Understands that oxygen is a medication prescribed by physician. Demonstrates appropriate use of inhaler and oxygen therapy.   Diabetes Yes   Intervention Provide education about signs/symptoms and action to take for hypo/hyperglycemia.;Provide education about proper nutrition, including hydration, and aerobic/resistive exercise prescription along with prescribed medications to achieve blood glucose in normal ranges: Fasting glucose 65-99 mg/dL   Expected Outcomes Short Term: Participant verbalizes understanding of  the signs/symptoms and immediate care of hyper/hypoglycemia, proper foot care and importance of medication, aerobic/resistive exercise and nutrition plan for blood glucose control.;Long Term: Attainment of HbA1C < 7%.   Hypertension Yes   Intervention Provide education on lifestyle modifcations including regular physical activity/exercise, weight management, moderate sodium restriction and increased consumption of fresh fruit, vegetables, and low fat dairy, alcohol moderation, and smoking cessation.;Monitor prescription use compliance.   Expected Outcomes Short Term: Continued assessment and intervention until BP is < 140/30mm HG in hypertensive participants. < 130/13mm HG in hypertensive participants with diabetes, heart failure or chronic kidney disease.;Long Term: Maintenance of blood pressure at goal levels.      Tobacco Use Initial Evaluation: History  Smoking Status   Former Smoker   Packs/day: 2.00   Years: 20.00   Types: Cigarettes, Pipe   Quit date: 11/22/1999  Smokeless Tobacco   Never Used    Comment: quit cigs in 72 and pipe in 01    Copy of goals given to participant.

## 2015-11-29 DIAGNOSIS — J4530 Mild persistent asthma, uncomplicated: Secondary | ICD-10-CM | POA: Diagnosis not present

## 2015-11-29 DIAGNOSIS — J849 Interstitial pulmonary disease, unspecified: Secondary | ICD-10-CM | POA: Diagnosis not present

## 2015-11-29 DIAGNOSIS — J8410 Pulmonary fibrosis, unspecified: Secondary | ICD-10-CM

## 2015-11-29 LAB — GLUCOSE, CAPILLARY
GLUCOSE-CAPILLARY: 271 mg/dL — AB (ref 65–99)
Glucose-Capillary: 301 mg/dL — ABNORMAL HIGH (ref 65–99)

## 2015-11-29 NOTE — Progress Notes (Signed)
Daily Session Note  Patient Details  Name: Jeremiah Gibson MRN: [id number] Date of Birth: Nov 20, 1935 Referring Provider:   Flowsheet Row Pulmonary Rehab from 11/23/2015 in [facility] Cardiac and Pulmonary Rehab  Referring Provider  [name]      Encounter Date: 11/29/2015  Check In:     Session Check In - 11/29/15 1452      Check-In   Location [facility]-Cardiac & Pulmonary Rehab   Staff Present Earlean Shawl, BS, ACSM CEP, Exercise Physiologist;Laureen Owens Shark, BS, RRT, Respiratory Dareen Piano, BA, ACSM CEP, Exercise Physiologist   Supervising physician immediately available to respond to emergencies [facility] immediately available ER MD   Physician(s) Elenore Rota   Medication changes reported     No   Fall or balance concerns reported    No   Warm-up and Cool-down Performed as group-led instruction   Resistance Training Performed Yes   VAD Patient? No     Pain Assessment   Currently in Pain? No/denies   Multiple Pain Sites No         Goals Met:  Proper associated with RPD/PD & O2 Sat Exercise tolerated well Strength training completed today  Goals Unmet:  Not Applicable  Comments: First full day of exercise!  Patient was oriented to gym and equipment including functions, settings, policies, and procedures.  Patient's individual exercise prescription and treatment plan were reviewed.  All starting workloads were established based on the results of the 6 minute walk test done at initial orientation visit.  The plan for exercise progression was also introduced and progression will be customized based on patient's performance and goals.   Dr. Emily Filbert is Medical Director for [employer] and [facility].

## 2015-12-01 ENCOUNTER — Encounter: Payer: Medicare Other | Admitting: *Deleted

## 2015-12-01 DIAGNOSIS — J849 Interstitial pulmonary disease, unspecified: Secondary | ICD-10-CM | POA: Diagnosis not present

## 2015-12-01 DIAGNOSIS — J8410 Pulmonary fibrosis, unspecified: Secondary | ICD-10-CM

## 2015-12-01 NOTE — Progress Notes (Signed)
Daily Session Note  Patient Details  Name: Jeremiah Gibson MRN: [id number] Date of Birth: [birth year] Referring Provider:   Flowsheet Row Pulmonary Rehab from 11/23/2015 in [facility] Cardiac and Pulmonary Rehab  Referring Provider  [name]      Encounter Date: 12/01/2015  Check In:     Session Check In - 12/01/15 1233      Check-In   Location [facility]-Cardiac & Pulmonary Rehab   Staff Present Nyoka Cowden, RN, BSN, Willette Pa, MA, ACSM RCEP, Exercise Physiologist;Amanda Oletta Darter, BA, ACSM CEP, Exercise Physiologist   Supervising physician immediately available to respond to emergencies [facility] immediately available ER MD   Physician(s) Tilda Franco and Marcelene Butte   Medication changes reported     No   Fall or balance concerns reported    No   Warm-up and Cool-down Performed on first and last piece of equipment   Resistance Training Performed Yes   VAD Patient? No     Pain Assessment   Currently in Pain? No/denies         Goals Met:  Proper associated with RPD/PD & O2 Sat Independence with exercise equipment Exercise tolerated well No report of cardiac concerns or symptoms Strength training completed today  Goals Unmet:  Not Applicable  Comments:    Dr. Emily Filbert is Medical Director for [place] and [facility].

## 2015-12-03 ENCOUNTER — Encounter: Payer: Medicare Other | Admitting: Respiratory Therapy

## 2015-12-03 DIAGNOSIS — J8410 Pulmonary fibrosis, unspecified: Secondary | ICD-10-CM

## 2015-12-03 DIAGNOSIS — J849 Interstitial pulmonary disease, unspecified: Secondary | ICD-10-CM | POA: Diagnosis not present

## 2015-12-03 LAB — GLUCOSE, CAPILLARY
GLUCOSE-CAPILLARY: 242 mg/dL — AB (ref 65–99)
GLUCOSE-CAPILLARY: 201 mg/dL — AB (ref 65–99)

## 2015-12-03 NOTE — Progress Notes (Signed)
Daily Session Note  Patient Details  Name: Jeremiah Gibson MRN: [id number] Date of Birth: 1935-05-10 Referring Provider:   Flowsheet Row Pulmonary Rehab from 11/23/2015 in [facility] Cardiac and Pulmonary Rehab  Referring Provider  [name]      Encounter Date: 12/03/2015  Check In:     Session Check In - 12/03/15 1411      Check-In   Location [facility]-Cardiac & Pulmonary Rehab   Staff Present Gerlene Burdock, RN, Levie Heritage, MA, ACSM RCEP, Exercise Physiologist;Shone Leventhal Blanch Media, RRT, RCP, Respiratory Therapist   Supervising physician immediately available to respond to emergencies [facility] immediately available ER MD   Physician(s) Dr. Jimmye Norman and Darl Householder   Medication changes reported     No   Fall or balance concerns reported    No   Warm-up and Cool-down Performed on first and last piece of equipment   Resistance Training Performed Yes   VAD Patient? No     Pain Assessment   Currently in Pain? No/denies         Goals Met:  Proper associated with RPD/PD & O2 Sat Independence with exercise equipment Using PLB without cueing & demonstrates good technique Exercise tolerated well Strength training completed today  Goals Unmet:  Not Applicable  Comments: Pt able to follow exercise prescription today without complaint.  Will continue to monitor for progression.    Dr. Emily Filbert is Medical Director for [place] and [facility].

## 2015-12-06 DIAGNOSIS — J849 Interstitial pulmonary disease, unspecified: Secondary | ICD-10-CM | POA: Diagnosis not present

## 2015-12-06 DIAGNOSIS — J8410 Pulmonary fibrosis, unspecified: Secondary | ICD-10-CM

## 2015-12-06 NOTE — Progress Notes (Signed)
Daily Session Note  Patient Details  Name: Jeremiah Gibson MRN: [id number] Date of Birth: 1935-07-26 Referring Provider:   Flowsheet Row Pulmonary Rehab from 11/23/2015 in [facility] Cardiac and Pulmonary Rehab  Referring Provider  [name]      Encounter Date: 12/06/2015  Check In:     Session Check In - 12/06/15 1401      Check-In   Location [facility]-Cardiac & Pulmonary Rehab   Staff Present Earlean Shawl, BS, ACSM CEP, Exercise Physiologist;Lyndsey Demos Oletta Darter, BA, ACSM CEP, Exercise Physiologist;Laureen Owens Shark, BS, RRT, Respiratory Therapist   Supervising physician immediately available to respond to emergencies [facility] immediately available ER MD   Physician(s) Orvilla Cornwall   Medication changes reported     No   Fall or balance concerns reported    No   Warm-up and Cool-down Performed as group-led Location manager Performed Yes   VAD Patient? No     Pain Assessment   Currently in Pain? No/denies   Multiple Pain Sites No         Goals Met:  Proper associated with RPD/PD & O2 Sat Independence with exercise equipment Exercise tolerated well Strength training completed today  Goals Unmet:  Not Applicable  Comments: Pt able to follow exercise prescription today without complaint.  Will continue to monitor for progression.    Dr. Emily Filbert is Medical Director for [place] and [facility].

## 2015-12-08 DIAGNOSIS — J8410 Pulmonary fibrosis, unspecified: Secondary | ICD-10-CM

## 2015-12-08 DIAGNOSIS — J849 Interstitial pulmonary disease, unspecified: Secondary | ICD-10-CM | POA: Diagnosis not present

## 2015-12-08 NOTE — Progress Notes (Signed)
Daily Session Note  Patient Details  Name: Jeremiah Gibson MRN: [id number] Date of Birth: Sep 13, 1935 Referring Provider:   Flowsheet Row Pulmonary Rehab from 11/23/2015 in [facility] Cardiac and Pulmonary Rehab  Referring Provider  [name]      Encounter Date: 12/08/2015  Check In:     Session Check In - 12/08/15 1309      Check-In   Location [facility]-Cardiac & Pulmonary Rehab   Staff Present Heath Lark, RN, BSN, CCRP;Laureen Owens Shark, BS, RRT, Respiratory Dareen Piano, BA, ACSM CEP, Exercise Physiologist   Supervising physician immediately available to respond to emergencies [facility] immediately available ER MD   Physician(s) Alfred Levins and Joni Fears   Medication changes reported     No   Fall or balance concerns reported    No   Warm-up and Cool-down Performed as group-led Location manager Performed Yes   VAD Patient? No     Pain Assessment   Currently in Pain? No/denies         Goals Met:  Proper associated with RPD/PD & O2 Sat Independence with exercise equipment Exercise tolerated well Strength training completed today  Goals Unmet:  Not Applicable  Comments: Pt able to follow exercise prescription today without complaint.  Will continue to monitor for progression.    Dr. Emily Filbert is Medical Director for [place] and [facility].

## 2015-12-09 DIAGNOSIS — E119 Type 2 diabetes mellitus without complications: Secondary | ICD-10-CM | POA: Diagnosis not present

## 2015-12-10 ENCOUNTER — Encounter: Payer: Medicare Other | Admitting: Respiratory Therapy

## 2015-12-10 DIAGNOSIS — J849 Interstitial pulmonary disease, unspecified: Secondary | ICD-10-CM | POA: Diagnosis not present

## 2015-12-10 DIAGNOSIS — J8410 Pulmonary fibrosis, unspecified: Secondary | ICD-10-CM

## 2015-12-10 NOTE — Progress Notes (Signed)
Daily Session Note  Patient Details  Name: Jeremiah Gibson MRN: [id number] Date of Birth: 12-21-1935 Referring Provider:   Flowsheet Row Pulmonary Rehab from 11/23/2015 in [facility] Cardiac and Pulmonary Rehab  Referring Provider  [name]      Encounter Date: 12/10/2015  Check In:     Session Check In - 12/10/15 1250      Check-In   Location [facility]-Cardiac & Pulmonary Rehab   Staff Present Nyoka Cowden, RN, BSN, Donavan Foil, RN, BSN, CCRP;Ravenna Legore Blanch Media, RRT, RCP, Respiratory Therapist   Supervising physician immediately available to respond to emergencies [facility] immediately available ER MD   Physician(s) Dr. Edd Fabian & Marcelene Butte   Medication changes reported     No   Fall or balance concerns reported    No   Warm-up and Cool-down Performed on first and last piece of equipment   Resistance Training Performed Yes   VAD Patient? No     Pain Assessment   Currently in Pain? No/denies         Goals Met:  Proper associated with RPD/PD & O2 Sat Independence with exercise equipment Using PLB without cueing & demonstrates good technique Exercise tolerated well Personal goals reviewed Strength training completed today  Goals Unmet:  Not Applicable  Comments: Pt able to follow exercise prescription today without complaint.  Will continue to monitor for progression.    Dr. Emily Filbert is Medical Director for [place] and [facility].

## 2015-12-13 ENCOUNTER — Telehealth: Payer: Self-pay | Admitting: Family Medicine

## 2015-12-13 ENCOUNTER — Encounter: Payer: Self-pay | Admitting: Respiratory Therapy

## 2015-12-13 ENCOUNTER — Encounter: Payer: Medicare Other | Attending: Pulmonary Disease

## 2015-12-13 ENCOUNTER — Telehealth: Payer: Self-pay | Admitting: Pulmonary Disease

## 2015-12-13 DIAGNOSIS — J4530 Mild persistent asthma, uncomplicated: Secondary | ICD-10-CM | POA: Insufficient documentation

## 2015-12-13 DIAGNOSIS — J849 Interstitial pulmonary disease, unspecified: Secondary | ICD-10-CM | POA: Insufficient documentation

## 2015-12-13 DIAGNOSIS — J8410 Pulmonary fibrosis, unspecified: Secondary | ICD-10-CM

## 2015-12-13 NOTE — Progress Notes (Signed)
Daily Session Note  Patient Details  Name: Jeremiah Gibson MRN: [id number] Date of Birth: 12-04-1935 Referring Provider:   Flowsheet Row Pulmonary Rehab from 11/23/2015 in [facility] Cardiac and Pulmonary Rehab  Referring Provider  [name]      Encounter Date: 12/13/2015  Check In:     Session Check In - 12/13/15 1441      Check-In   Location [facility]-Cardiac & Pulmonary Rehab   Staff Present Earlean Shawl, BS, ACSM CEP, Exercise Physiologist;Amanda Oletta Darter, BA, ACSM CEP, Exercise Physiologist;Laureen Janell Quiet, RRT, Respiratory Therapist   Supervising physician immediately available to respond to emergencies [facility] immediately available ER MD   Physician(s) Jacqualine Code and Lord   Medication changes reported     No   Fall or balance concerns reported    No   Warm-up and Cool-down Performed as group-led instruction   Resistance Training Performed Yes   VAD Patient? No     Pain Assessment   Currently in Pain? No/denies   Multiple Pain Sites No         Goals Met:  Proper associated with RPD/PD & O2 Sat Independence with exercise equipment Exercise tolerated well Strength training completed today  Goals Unmet:  Not Applicable  Comments: Pt able to follow exercise prescription today without complaint.  Will continue to monitor for progression.    Dr. Emily Filbert is Medical Director for [place] and [facility].

## 2015-12-13 NOTE — Telephone Encounter (Signed)
lmtcb

## 2015-12-13 NOTE — Telephone Encounter (Signed)
This a pt of Dr Elease HashimotoMaloney and he is requesting to see Dr Sherrie MustacheFisher for his CPE.  Pt does not want to see a PA.  Can I set an appointment with Dr Sherrie MustacheFisher?  CB#[phone]

## 2015-12-13 NOTE — Telephone Encounter (Signed)
That's fine. Thanks!

## 2015-12-13 NOTE — Progress Notes (Signed)
Pulmonary Individual Treatment Plan  Patient Details  Name: Jeremiah Gibson MRN: [id number] Date of Birth: 09-16-1935 Referring Provider:   Flowsheet Row Pulmonary Rehab from 11/23/2015 in [facility] Cardiac and Pulmonary Rehab  Referring Provider  [name]      Initial Encounter Date:  Flowsheet Row Pulmonary Rehab from 11/23/2015 in [facility] Cardiac and Pulmonary Rehab  Date  11/23/15  Referring Provider  [name]      Visit Diagnosis: Pulmonary fibrosis ([place])  Patient's Home Medications on Admission:  Current Outpatient Prescriptions:    albuterol (VENTOLIN HFA) 108 (90 Base) MCG/ACT inhaler, Inhale 2 puffs into the lungs every 4 (four) hours as needed for wheezing or shortness of breath., Disp: 3 Inhaler, Rfl: 3   allopurinol (ZYLOPRIM) 100 MG tablet, Take 2 tablets (200 mg total) by mouth 2 (two) times daily., Disp: 360 tablet, Rfl: 3   amLODipine (NORVASC) 5 MG tablet, Take 1 tablet by mouth  every day for blood  pressure, Disp: 90 tablet, Rfl: 3   aspirin 81 MG tablet, Take 81 mg by mouth daily., Disp: , Rfl:    benzonatate (TESSALON) 100 MG capsule, Take 1 capsule (100 mg total) by mouth 3 (three) times daily as needed for cough., Disp: 60 capsule, Rfl: 0   famotidine (PEPCID) 10 MG tablet, Take 1 tablet (10 mg total) by mouth daily., Disp: 90 tablet, Rfl: 3   finasteride (PROSCAR) 5 MG tablet, Take 1 tablet by mouth  every day, Disp: 90 tablet, Rfl: 3   fish oil-omega-3 fatty acids 1000 MG capsule, Take 1 g by mouth 3 (three) times daily. , Disp: , Rfl:    Fluticasone-Salmeterol (ADVAIR DISKUS) 250-50 MCG/DOSE AEPB, Inhale 1 puff into the lungs 2 (two) times daily., Disp: 14 each, Rfl: 0   glipiZIDE (GLUCOTROL XL) 10 MG 24 hr tablet, Take 1 tablet by mouth  daily with food for  diabetes; take with largest meal of the day, Disp: 90 tablet, Rfl: 3   glucose blood test strip, Check sugar once daily. One Touch glucose strips. Dx: E11.9, Disp: 50 each, Rfl: 12   losartan (COZAAR) 100 MG  tablet, Take 1 tablet by mouth  every day for blood  pressure, Disp: 90 tablet, Rfl: 3   Magnesium Citrate POWD, by Does not apply route daily., Disp: , Rfl:    metFORMIN (GLUCOPHAGE) 500 MG tablet, Take 1 tablet (500 mg total) by mouth 2 (two) times daily with a meal., Disp: 180 tablet, Rfl: 1   MULTIPLE VITAMIN PO, MULTIVITAMINS (Oral Tablet)  1 Every Day for 0 days  Quantity: 0.00;  Refills: 0   Ordered :06-Jan-2010  Doy Hutching ;  Started 22-Mar-2009 Active, Disp: , Rfl:    Nintedanib (OFEV) 100 MG CAPS, Take 100 mg by mouth 2 (two) times daily., Disp: , Rfl:    omeprazole (PRILOSEC OTC) 20 MG tablet, Take 1 tablet (20 mg total) by mouth daily., Disp: 90 tablet, Rfl: 3   potassium chloride SA (K-DUR,KLOR-CON) 20 MEQ tablet, Take 1 tablet by mouth  daily, Disp: 90 tablet, Rfl: 3   predniSONE (DELTASONE) 5 MG tablet, Take 1 tablet (5 mg total) by mouth as directed., Disp: 60 tablet, Rfl: 0   simvastatin (ZOCOR) 10 MG tablet, Take 1 tablet by mouth  every day, Disp: 90 tablet, Rfl: 3   terazosin (HYTRIN) 5 MG capsule, Take 1 capsule by mouth  every day for prostate, Disp: 90 capsule, Rfl: 3   triamterene-hydrochlorothiazide (MAXZIDE-25) 37.5-25 MG tablet, Take 1 tablet by mouth  Every Day for blood  pressure, Disp: 90 tablet, Rfl: 3  Past Medical History: Past Medical History:  Diagnosis Date   Anemia    Asthma    Colon polyps    Diverticulosis of colon (without mention of hemorrhage)    Esophageal reflux    Gout    Hyperlipidemia    Hypertension    Impotence of organic origin    Leg cramps    Type II or unspecified type diabetes mellitus without mention of complication, not stated as uncontrolled     Tobacco Use: History  Smoking Status   Former Smoker   Packs/day: 2.00   Years: 20.00   Types: Cigarettes, Pipe   Quit date: 11/22/1999  Smokeless Tobacco   Never Used    Comment: quit cigs in 62 and pipe in 01    Labs: Recent Review Flowsheet  Data    Labs for ITP Cardiac and Pulmonary Rehab Latest Ref Rng & Units 11/04/2014 02/09/2015 05/11/2015 08/10/2015   Cholestrol 100 - 199 mg/dL - - 177 -   LDLCALC 0 - 99 mg/dL - - 99 -   HDL >39 mg/dL - - 42 -   Trlycerides 0 - 149 mg/dL - - 178(H) -   Hemoglobin A1c - 7.7 7.9 7.8 7.9       ADL UCSD:     Pulmonary Assessment Scores    Row Name 11/23/15 1311         ADL UCSD   ADL Phase Entry     SOB Score total 8     Rest 0     Walk 0     Stairs 1     Bath 0     Dress 0     Shop 0        Pulmonary Function Assessment:     Pulmonary Function Assessment - 11/23/15 1310      Initial Spirometry Results   FVC% 86 %   FEV1% 98 %   FEV1/FVC Ratio 82   Comments Test date 07/20/15     Post Bronchodilator Spirometry Results   FVC% 81 %   FEV1% 93 %   FEV1/FVC Ratio 82     Breath   Bilateral Breath Sounds Basilar;Rales   Shortness of Breath Yes      Exercise Target Goals:    Exercise Program Goal: Individual exercise prescription set with THRR, safety & activity barriers. Participant demonstrates ability to understand and report RPE using BORG scale, to self-measure pulse accurately, and to acknowledge the importance of the exercise prescription.  Exercise Prescription Goal: Starting with aerobic activity 30 plus minutes a day, 3 days per week for initial exercise prescription. Provide home exercise prescription and guidelines that participant acknowledges understanding prior to discharge.  Activity Barriers & Risk Stratification:     Activity Barriers & Cardiac Risk Stratification - 11/23/15 1309      Activity Barriers & Cardiac Risk Stratification   Activity Barriers Shortness of Breath;Deconditioning   Cardiac Risk Stratification Moderate      6 Minute Walk:     6 Minute Walk    Row Name 11/23/15 1236         6 Minute Walk   Distance 1320 feet     Walk Time 6 minutes     MPH 2.5     METS 2.49     RPE 13     Perceived Dyspnea  3     VO2 Peak  8.7     Symptoms  No     Resting HR 85 bpm     Resting BP 124/76     Max Ex. HR 105 bpm     Max Ex. BP 144/64       Interval HR   Baseline HR 85     1 Minute HR 92     2 Minute HR 93     3 Minute HR 102     4 Minute HR 103     5 Minute HR 103     6 Minute HR 105     2 Minute Post HR 96     Interval Heart Rate? Yes       Interval Oxygen   Interval Oxygen? Yes     Baseline Oxygen Saturation % 97 %     1 Minute Oxygen Saturation % 93 %     2 Minute Oxygen Saturation % 88 %     3 Minute Oxygen Saturation % 89 %     4 Minute Oxygen Saturation % 90 %     5 Minute Oxygen Saturation % 91 %     6 Minute Oxygen Saturation % 93 %     2 Minute Post Oxygen Saturation % 96 %        Initial Exercise Prescription:     Initial Exercise Prescription - 11/23/15 1200      Date of Initial Exercise RX and Referring Provider   Date 11/23/15   Referring Provider [name]     Treadmill   MPH 1.7   Grade 0.5   Minutes 15   METs 2.42     Recumbant Bike   Level 1   RPM 60   Minutes 15   METs 2.3     NuStep   Level 2   Minutes 15   METs 2     T5 Nustep   Level 1   Minutes 15   METs 2     Biostep-RELP   Level 2   Minutes 15   METs 2     Prescription Details   Frequency (times per week) 3   Duration Progress to 45 minutes of aerobic exercise without signs/symptoms of physical distress     Intensity   THRR 40-80% of Max Heartrate 107-130   Ratings of Perceived Exertion 11-13   Perceived Dyspnea 0-4     Progression   Progression Continue to progress workloads to maintain intensity without signs/symptoms of physical distress.     Resistance Training   Training Prescription Yes   Weight 2   Reps 10-15      Perform Capillary Blood Glucose checks as needed.  Exercise Prescription Changes:     Exercise Prescription Changes    Row Name 11/29/15 1400 12/08/15 1300           Exercise Review   Progression  -- Yes        Response to Exercise   Blood Pressure  (Admit) 120/70 120/62      Blood Pressure (Exercise) 134/82 148/82      Blood Pressure (Exit) 138/64 134/78      Heart Rate (Admit) 76 bpm 94 bpm      Heart Rate (Exercise) 98 bpm 94 bpm      Heart Rate (Exit) 74 bpm 88 bpm      Oxygen Saturation (Admit) 97 % 95 %      Oxygen Saturation (Exercise) 97 % 95 %      Oxygen Saturation (Exit) 98 % 96 %  Rating of Perceived Exertion (Exercise) 15 13      Perceived Dyspnea (Exercise) 4 3      Symptoms none none      Duration Progress to 45 minutes of aerobic exercise without signs/symptoms of physical distress Progress to 45 minutes of aerobic exercise without signs/symptoms of physical distress      Intensity THRR unchanged THRR unchanged        Progression   Progression Continue to progress workloads to maintain intensity without signs/symptoms of physical distress. Continue to progress workloads to maintain intensity without signs/symptoms of physical distress.        Resistance Training   Training Prescription Yes Yes      Weight 4 4      Reps 10-15 10-15        Interval Training   Interval Training No No        Treadmill   MPH 1  --      Grade 0.5  --      Minutes 10  5/5  --        Arm/Foot Ergometer   Level  -- 4      Minutes  -- 15        T5 Nustep   Level 2 2      Minutes 15 15      METs  -- 2        Biostep-RELP   Level 2 2      Minutes 15 15      METs  -- 2         Exercise Comments:     Exercise Comments    Row Name 11/29/15 1503 12/08/15 1313         Exercise Comments Mr Chavarin did well his first day of exercise.  Exercise equipment safety, infection control, and diabetes, high and low blood sugar signs were reveiwed. Therron is progressing well with exercise.         Discharge Exercise Prescription (Final Exercise Prescription Changes):     Exercise Prescription Changes - 12/08/15 1300      Exercise Review   Progression Yes     Response to Exercise   Blood Pressure (Admit) 120/62   Blood  Pressure (Exercise) 148/82   Blood Pressure (Exit) 134/78   Heart Rate (Admit) 94 bpm   Heart Rate (Exercise) 94 bpm   Heart Rate (Exit) 88 bpm   Oxygen Saturation (Admit) 95 %   Oxygen Saturation (Exercise) 95 %   Oxygen Saturation (Exit) 96 %   Rating of Perceived Exertion (Exercise) 13   Perceived Dyspnea (Exercise) 3   Symptoms none   Duration Progress to 45 minutes of aerobic exercise without signs/symptoms of physical distress   Intensity THRR unchanged     Progression   Progression Continue to progress workloads to maintain intensity without signs/symptoms of physical distress.     Resistance Training   Training Prescription Yes   Weight 4   Reps 10-15     Interval Training   Interval Training No     Arm/Foot Ergometer   Level 4   Minutes 15     T5 Nustep   Level 2   Minutes 15   METs 2     Biostep-RELP   Level 2   Minutes 15   METs 2       Nutrition:  Target Goals: Understanding of nutrition guidelines, daily intake of sodium <1553m, cholesterol <2067m calories 30% from fat and 7% or  less from saturated fats, daily to have 5 or more servings of fruits and vegetables.  Biometrics:     Pre Biometrics - 11/23/15 1234      Pre Biometrics   Height 5' 10"  (1.778 m)   Weight 214 lb 8 oz (97.3 kg)   Waist Circumference 44 inches   Hip Circumference 43 inches   Waist to Hip Ratio 1.02 %   BMI (Calculated) 30.8       Nutrition Therapy Plan and Nutrition Goals:   Nutrition Discharge: Rate Your Plate Scores:   Psychosocial: Target Goals: Acknowledge presence or absence of depression, maximize coping skills, provide positive support system. Participant is able to verbalize types and ability to use techniques and skills needed for reducing stress and depression.  Initial Review & Psychosocial Screening:     Initial Psych Review & Screening - 11/23/15 [place]? Yes   Comments Mr Hyland has great support  from his wife. He has accepted his pulmonary fibrosis and is very positive about managing the disease.     Barriers   Psychosocial barriers to participate in program The patient should benefit from training in stress management and relaxation.     Screening Interventions   Interventions Encouraged to exercise      Quality of Life Scores:     Quality of Life - 11/23/15 1333      Quality of Life Scores   Health/Function Pre 20.19 %   Socioeconomic Pre 20.4 %   Psych/Spiritual Pre 20.29 %   Family Pre 20 %   GLOBAL Pre 20.21 %      PHQ-9: Recent Review Flowsheet Data    Depression screen [facility] 2/9 11/23/2015   Decreased Interest 0   Down, Depressed, Hopeless 0   PHQ - 2 Score 0   Altered sleeping 0   Tired, decreased energy 0   Change in appetite 0   Feeling bad or failure about yourself  0   Trouble concentrating 0   Moving slowly or fidgety/restless 0   Suicidal thoughts 0   PHQ-9 Score 0      Psychosocial Evaluation and Intervention:     Psychosocial Evaluation - 12/01/15 1131      Psychosocial Evaluation & Interventions   Interventions Encouraged to exercise with the program and follow exercise prescription   Comments Counselor met with Mr. Alfrey today for initial psychosocial evaluation.  He is a 80 year old who has been recently diagnosed with pulmonary fibrosis.  He has a strong support system with a spouse of 69 years; friends close by & active involvement in his local church community.  Mr. Congrove has other health issues that are challenging in addition to the pulmonary fibrosis; he has diabetes; high cholesterol and high blood pressure.  He states he sleeps well and has a good appetite.  He denies a history or current symptoms of depresssion or anxiety and is typically in a positive mood.  Mr. Worton reports minimal stress in his life other than his health issues.  He has goals to breathe better while in this program and plans to continue exercising with his  silver sneakers class upon completion in this pulmonary rehab class.        Psychosocial Re-Evaluation:  Education: Education Goals: Education classes will be provided on a weekly basis, covering required topics. Participant will state understanding/return demonstration of topics presented.  Learning Barriers/Preferences:     Learning Barriers/Preferences -  11/23/15 1310      Learning Barriers/Preferences   Learning Barriers None   Learning Preferences Group Instruction;Individual Instruction;Pictoral;Skilled Demonstration;Verbal Instruction;Video;Written Material      Education Topics: Initial Evaluation Education: - Verbal, written and demonstration of respiratory meds, RPE/PD scales, oximetry and breathing techniques. Instruction on use of nebulizers and MDIs: cleaning and proper use, rinsing mouth with steroid doses and importance of monitoring MDI activations. Flowsheet Row Pulmonary Rehab from 11/23/2015 in [facility] Cardiac and Pulmonary Rehab  Date  11/23/15  Educator  LB  Instruction Review Code  2- meets goals/outcomes      General Nutrition Guidelines/Fats and Fiber: -Group instruction provided by verbal, written material, models and posters to present the general guidelines for heart healthy nutrition. Gives an explanation and review of dietary fats and fiber.   Controlling Sodium/Reading Food Labels: -Group verbal and written material supporting the discussion of sodium use in heart healthy nutrition. Review and explanation with models, verbal and written materials for utilization of the food label.   Exercise Physiology & Risk Factors: - Group verbal and written instruction with models to review the exercise physiology of the cardiovascular system and associated critical values. Details cardiovascular disease risk factors and the goals associated with each risk factor.   Aerobic Exercise & Resistance Training: - Gives group verbal and written discussion on the  health impact of inactivity. On the components of aerobic and resistive training programs and the benefits of this training and how to safely progress through these programs. Flowsheet Row Pulmonary Rehab from 12/01/2015 in [facility] Cardiac and Pulmonary Rehab  Date  12/01/15  Educator  Nada Maclachlan  Instruction Review Code  2- meets goals/outcomes      Flexibility, Balance, General Exercise Guidelines: - Provides group verbal and written instruction on the benefits of flexibility and balance training programs. Provides general exercise guidelines with specific guidelines to those with heart or lung disease. Demonstration and skill practice provided.   Stress Management: - Provides group verbal and written instruction about the health risks of elevated stress, cause of high stress, and healthy ways to reduce stress.   Depression: - Provides group verbal and written instruction on the correlation between heart/lung disease and depressed mood, treatment options, and the stigmas associated with seeking treatment.   Exercise & Equipment Safety: - Individual verbal instruction and demonstration of equipment use and safety with use of the equipment. Flowsheet Row Pulmonary Rehab from 12/01/2015 in [facility] Cardiac and Pulmonary Rehab  Date  11/29/15  Educator  AS  Instruction Review Code  2- meets goals/outcomes      Infection Prevention: - Provides verbal and written material to individual with discussion of infection control including proper hand washing and proper equipment cleaning during exercise session. Flowsheet Row Pulmonary Rehab from 12/01/2015 in [facility] Cardiac and Pulmonary Rehab  Date  11/29/15  Educator  AS  Instruction Review Code  2- meets goals/outcomes      Falls Prevention: - Provides verbal and written material to individual with discussion of falls prevention and safety. Flowsheet Row Pulmonary Rehab from 11/23/2015 in [facility] Cardiac and Pulmonary Rehab  Date  11/23/15   Educator  LB  Instruction Review Code  2- meets goals/outcomes      Diabetes: - Individual verbal and written instruction to review signs/symptoms of diabetes, desired ranges of glucose level fasting, after meals and with exercise. Advice that pre and post exercise glucose checks will be done for 3 sessions at entry of program. Flowsheet Row Pulmonary Rehab from 12/01/2015  in [facility] Cardiac and Pulmonary Rehab  Date  11/29/15  Educator  AS  Instruction Review Code  2- meets goals/outcomes      Chronic Lung Diseases: - Group verbal and written instruction to review new updates, new respiratory medications, new advancements in procedures and treatments. Provide informative websites and "800" numbers of self-education.   Lung Procedures: - Group verbal and written instruction to describe testing methods done to diagnose lung disease. Review the outcome of test results. Describe the treatment choices: Pulmonary Function Tests, ABGs and oximetry.   Energy Conservation: - Provide group verbal and written instruction for methods to conserve energy, plan and organize activities. Instruct on pacing techniques, use of adaptive equipment and posture/positioning to relieve shortness of breath.   Triggers: - Group verbal and written instruction to review types of environmental controls: home humidity, furnaces, filters, dust mite/pet prevention, HEPA vacuums. To discuss weather changes, air quality and the benefits of nasal washing.   Exacerbations: - Group verbal and written instruction to provide: warning signs, infection symptoms, calling MD promptly, preventive modes, and value of vaccinations. Review: effective airway clearance, coughing and/or vibration techniques. Create an Sports administrator.   Oxygen: - Individual and group verbal and written instruction on oxygen therapy. Includes supplement oxygen, available portable oxygen systems, continuous and intermittent flow rates, oxygen safety,  concentrators, and Medicare reimbursement for oxygen.   Respiratory Medications: - Group verbal and written instruction to review medications for lung disease. Drug class, frequency, complications, importance of spacers, rinsing mouth after steroid MDI's, and proper cleaning methods for nebulizers. Flowsheet Row Pulmonary Rehab from 11/23/2015 in [facility] Cardiac and Pulmonary Rehab  Date  11/23/15  Educator  LB  Instruction Review Code  2- meets goals/outcomes      AED/CPR: - Group verbal and written instruction with the use of models to demonstrate the basic use of the AED with the basic ABC's of resuscitation.   Breathing Retraining: - Provides individuals verbal and written instruction on purpose, frequency, and proper technique of diaphragmatic breathing and pursed-lipped breathing. Applies individual practice skills. Flowsheet Row Pulmonary Rehab from 11/23/2015 in [facility] Cardiac and Pulmonary Rehab  Date  11/23/15  Educator  LB  Instruction Review Code  2- meets goals/outcomes      Anatomy and Physiology of the Lungs: - Group verbal and written instruction with the use of models to provide basic lung anatomy and physiology related to function, structure and complications of lung disease.   Heart Failure: - Group verbal and written instruction on the basics of heart failure: signs/symptoms, treatments, explanation of ejection fraction, enlarged heart and cardiomyopathy.   Sleep Apnea: - Individual verbal and written instruction to review Obstructive Sleep Apnea. Review of risk factors, methods for diagnosing and types of masks and machines for OSA.   Anxiety: - Provides group, verbal and written instruction on the correlation between heart/lung disease and anxiety, treatment options, and management of anxiety.   Relaxation: - Provides group, verbal and written instruction about the benefits of relaxation for patients with heart/lung disease. Also provides patients with  examples of relaxation techniques.   Knowledge Questionnaire Score:     Knowledge Questionnaire Score - 11/23/15 1310      Knowledge Questionnaire Score   Pre Score 8/10       Core Components/Risk Factors/Patient Goals at Admission:     Personal Goals and Risk Factors at Admission - 11/23/15 1319      Core Components/Risk Factors/Patient Goals on Admission    Weight Management Yes  Intervention Weight Management: Develop a combined nutrition and exercise program designed to reach desired caloric intake, while maintaining appropriate intake of nutrient and fiber, sodium and fats, and appropriate energy expenditure required for the weight goal.  Mr Eleazer prefers not to meet with the dietitian. His wife is a good , healthy cook. He does like bread, but not sweets.   Admit Weight 214 lb 6.4 oz (97.3 kg)   Goal Weight: Short Term 209 lb (94.8 kg)   Goal Weight: Long Term 204 lb (92.5 kg)   Expected Outcomes Understanding of distribution of calorie intake throughout the day with the consumption of 4-5 meals/snacks   Sedentary Yes   Intervention Provide advice, education, support and counseling about physical activity/exercise needs.;Develop an individualized exercise prescription for aerobic and resistive training based on initial evaluation findings, risk stratification, comorbidities and participant's personal goals.  He does the Silver Sneakers class twice a week..   Expected Outcomes Achievement of increased cardiorespiratory fitness and enhanced flexibility, muscular endurance and strength shown through measurements of functional capacity and personal statement of participant.   Increase Strength and Stamina Yes   Intervention Provide advice, education, support and counseling about physical activity/exercise needs.;Develop an individualized exercise prescription for aerobic and resistive training based on initial evaluation findings, risk stratification, comorbidities and  participant's personal goals.   Expected Outcomes Achievement of increased cardiorespiratory fitness and enhanced flexibility, muscular endurance and strength shown through measurements of functional capacity and personal statement of participant.   Improve shortness of breath with ADL's Yes   Intervention Provide education, individualized exercise plan and daily activity instruction to help decrease symptoms of SOB with activities of daily living.   Expected Outcomes Short Term: Achieves a reduction of symptoms when performing activities of daily living.   Develop more efficient breathing techniques such as purse lipped breathing and diaphragmatic breathing; and practicing self-pacing with activity Yes   Intervention Provide education, demonstration and support about specific breathing techniuqes utilized for more efficient breathing. Include techniques such as pursed lipped breathing, diaphragmatic breathing and self-pacing activity.   Expected Outcomes Short Term: Participant will be able to demonstrate and use breathing techniques as needed throughout daily activities.   Increase knowledge of respiratory medications and ability to use respiratory devices properly  Yes   Intervention Provide education and demonstration as needed of appropriate use of medications, inhalers, and oxygen therapy.  Advair and Proventil; spacer given   Expected Outcomes Short Term: Achieves understanding of medications use. Understands that oxygen is a medication prescribed by physician. Demonstrates appropriate use of inhaler and oxygen therapy.   Diabetes Yes   Intervention Provide education about signs/symptoms and action to take for hypo/hyperglycemia.;Provide education about proper nutrition, including hydration, and aerobic/resistive exercise prescription along with prescribed medications to achieve blood glucose in normal ranges: Fasting glucose 65-99 mg/dL   Expected Outcomes Short Term: Participant verbalizes  understanding of the signs/symptoms and immediate care of hyper/hypoglycemia, proper foot care and importance of medication, aerobic/resistive exercise and nutrition plan for blood glucose control.;Long Term: Attainment of HbA1C < 7%.   Hypertension Yes   Intervention Provide education on lifestyle modifcations including regular physical activity/exercise, weight management, moderate sodium restriction and increased consumption of fresh fruit, vegetables, and low fat dairy, alcohol moderation, and smoking cessation.;Monitor prescription use compliance.   Expected Outcomes Short Term: Continued assessment and intervention until BP is < 140/22m HG in hypertensive participants. < 130/884mHG in hypertensive participants with diabetes, heart failure or chronic kidney disease.;Long Term: Maintenance of  blood pressure at goal levels.      Core Components/Risk Factors/Patient Goals Review:      Goals and Risk Factor Review    Row Name 12/01/15 1243 12/10/15 1250 12/10/15 1252 12/10/15 1253       Core Components/Risk Factors/Patient Goals Review   Personal Goals Review Weight Management/Obesity;Sedentary;Increase Strength and Stamina;Hypertension;Diabetes Increase knowledge of respiratory medications and ability to use respiratory devices properly. Improve shortness of breath with ADL's Develop more efficient breathing techniques such as purse lipped breathing and diaphragmatic breathing and practicing self-pacing with activity.    Review Slayde had a good first day of exercise.  He was doing silver sneakers before, but not while in program.  His blood pressure was good here, but he does not check it at home.  His blood sugars have been good at home and here. He was down 1/2 lb today! Mr. Sweetman takes an Albuterol MDI and Advair at home for his breathing.  He has a spacer for the albuterol and knows to rinse his mouth after taking the Advair. Mr. Gullion has not seen any improventment in his SOB.  He is  only on his seventh session. Mr. Davisson demonstrates proper technique for PLB.    Expected Outcomes Kentravious will continue to come to exercise and education classes for risk factor modification.  We continue to monitor for progression. Taking his respiratory medications correctly will help reduces SOB and exacerbations.  Continued cardio and strength training should start to help Mr. Farra improve on his SOB and make ADLs easier for him. PLB will help Mr. Huttner regain his breath during exercise and episodes of SOB.        Core Components/Risk Factors/Patient Goals at Discharge (Final Review):      Goals and Risk Factor Review - 12/10/15 1253      Core Components/Risk Factors/Patient Goals Review   Personal Goals Review Develop more efficient breathing techniques such as purse lipped breathing and diaphragmatic breathing and practicing self-pacing with activity.   Review Mr. Nhan demonstrates proper technique for PLB.   Expected Outcomes PLB will help Mr. Bolla regain his breath during exercise and episodes of SOB.       ITP Comments:     ITP Comments    Row Name 12/08/15 1313           ITP Comments Kolbey is progressing well with exercise.          Comments: 30 day note review

## 2015-12-13 NOTE — Telephone Encounter (Signed)
Pt returned call. Pt is now scheduled for CPE/AWE on 12/28/15 @ 9 am with Dr. Sherrie MustacheFisher. Thanks TNP

## 2015-12-14 NOTE — Telephone Encounter (Signed)
[facility]Fisher[facility]. She said that pt is fine to have LFT's drawn there. They asked for faxed order request to be sent over. This has been done.  LMTCB for pt.

## 2015-12-14 NOTE — Telephone Encounter (Signed)
lmtcb x2 for pt. 

## 2015-12-14 NOTE — Telephone Encounter (Signed)
Pt returning call and can be reached  @ [phone].Caren GriffinsStanley A Dalton

## 2015-12-15 DIAGNOSIS — J8410 Pulmonary fibrosis, unspecified: Secondary | ICD-10-CM

## 2015-12-15 DIAGNOSIS — J849 Interstitial pulmonary disease, unspecified: Secondary | ICD-10-CM | POA: Diagnosis not present

## 2015-12-15 NOTE — Progress Notes (Signed)
Daily Session Note  Patient Details  Name: Jeremiah Gibson MRN: [id number] Date of Birth: 01/06/1936 Referring Provider:   Flowsheet Row Pulmonary Rehab from 11/23/2015 in [facility] Cardiac and Pulmonary Rehab  Referring Provider  [name]      Encounter Date: 12/15/2015  Check In:     Session Check In - 12/15/15 1146      Check-In   Location [facility]-Cardiac & Pulmonary Rehab   Staff Present Carson Myrtle, BS, RRT, Respiratory Lennie Hummer, MA, ACSM RCEP, Exercise Physiologist;Osmara Drummonds Oletta Darter, BA, ACSM CEP, Exercise Physiologist   Supervising physician immediately available to respond to emergencies [facility] immediately available ER MD   Physician(s) Edd Fabian and Corky Downs   Medication changes reported     No   Fall or balance concerns reported    No   Warm-up and Cool-down Performed as group-led Location manager Performed Yes   VAD Patient? No     Pain Assessment   Currently in Pain? No/denies   Multiple Pain Sites No         Goals Met:  Proper associated with RPD/PD & O2 Sat Independence with exercise equipment Exercise tolerated well Strength training completed today  Goals Unmet:  Not Applicable  Comments: Pt able to follow exercise prescription today without complaint.  Will continue to monitor for progression.    Dr. Emily Filbert is Medical Director for [place] and [facility].

## 2015-12-15 NOTE — Telephone Encounter (Signed)
Called and spoke with pt and he is aware of order that has been sent over to Dr. Esmond CamperFishers office. Nothing further is needed.

## 2015-12-17 ENCOUNTER — Encounter: Payer: Medicare Other | Admitting: *Deleted

## 2015-12-17 DIAGNOSIS — J849 Interstitial pulmonary disease, unspecified: Secondary | ICD-10-CM | POA: Diagnosis not present

## 2015-12-17 DIAGNOSIS — J8410 Pulmonary fibrosis, unspecified: Secondary | ICD-10-CM

## 2015-12-17 NOTE — Progress Notes (Signed)
Daily Session Note  Patient Details  Name: Jeremiah Gibson MRN: [id number] Date of Birth: 01/11/1936 Referring Provider:   April Manson Pulmonary Rehab from 11/23/2015 in [facility] Cardiac and Pulmonary Rehab  Referring Provider  [name]      Encounter Date: 12/17/2015  Check In:     Session Check In - 12/17/15 1331      Check-In   Location [facility]-Cardiac & Pulmonary Rehab   Staff Present Alberteen Sam, MA, ACSM RCEP, Exercise Physiologist;Laureen Owens Shark, BS, RRT, Respiratory Therapist;Carroll Enterkin, RN, BSN   Supervising physician immediately available to respond to emergencies See telemetry face sheet for immediately available ER MD   Physician(s) Drs. Izola Price and Lord   Medication changes reported     No   Fall or balance concerns reported    No   Warm-up and Cool-down Performed as group-led Location manager Performed Yes   VAD Patient? No     Pain Assessment   Currently in Pain? No/denies   Multiple Pain Sites No         Goals Met:  Proper associated with RPD/PD & O2 Sat Independence with exercise equipment Using PLB without cueing & demonstrates good technique Exercise tolerated well Strength training completed today  Goals Unmet:  Not Applicable  Comments: Pt able to follow exercise prescription today without complaint.  Will continue to monitor for progression.    Dr. Emily Filbert is Medical Director for [place] and [facility].

## 2015-12-20 DIAGNOSIS — J8410 Pulmonary fibrosis, unspecified: Secondary | ICD-10-CM

## 2015-12-20 DIAGNOSIS — J849 Interstitial pulmonary disease, unspecified: Secondary | ICD-10-CM | POA: Diagnosis not present

## 2015-12-20 NOTE — Progress Notes (Signed)
Daily Session Note  Patient Details  Name: Jeremiah Gibson MRN: [id number] Date of Birth: [birth year] Referring Provider:   Flowsheet Row Pulmonary Rehab from 11/23/2015 in [facility] Cardiac and Pulmonary Rehab  Referring Provider  [name]      Encounter Date: 12/20/2015  Check In:     Session Check In - 12/20/15 1507      Check-In   Location [facility]-Cardiac & Pulmonary Rehab   Staff Present Nada Maclachlan, BA, ACSM CEP, Exercise Physiologist;Laureen Owens Shark, BS, RRT, Respiratory Bertis Ruddy, BS, ACSM CEP, Exercise Physiologist   Supervising physician immediately available to respond to emergencies [facility] immediately available ER MD   Physician(s) Burlene Arnt and Marcelene Butte   Medication changes reported     No   Fall or balance concerns reported    No   Warm-up and Cool-down Performed as group-led Location manager Performed Yes   VAD Patient? No     Pain Assessment   Currently in Pain? No/denies   Multiple Pain Sites No         Goals Met:  Proper associated with RPD/PD & O2 Sat Independence with exercise equipment Exercise tolerated well Strength training completed today  Goals Unmet:  Not Applicable  Comments: Pt able to follow exercise prescription today without complaint.  Will continue to monitor for progression.    Dr. Emily Filbert is Medical Director for [place] and [facility].

## 2015-12-22 ENCOUNTER — Encounter: Payer: Medicare Other | Admitting: *Deleted

## 2015-12-22 DIAGNOSIS — J8410 Pulmonary fibrosis, unspecified: Secondary | ICD-10-CM

## 2015-12-22 DIAGNOSIS — J849 Interstitial pulmonary disease, unspecified: Secondary | ICD-10-CM | POA: Diagnosis not present

## 2015-12-22 NOTE — Progress Notes (Signed)
Daily Session Note  Patient Details  Name: Jeremiah Gibson MRN: [id number] Date of Birth: 20-Apr-1935 Referring Provider:   April Manson Pulmonary Rehab from 11/23/2015 in [facility] Cardiac and Pulmonary Rehab  Referring Provider  [name]      Encounter Date: 12/22/2015  Check In:     Session Check In - 12/22/15 1107      Check-In   Location [facility]-Cardiac & Pulmonary Rehab   Staff Present Alberteen Sam, MA, ACSM RCEP, Exercise Physiologist;Laureen Owens Shark, BS, RRT, Respiratory Therapist;Carroll Enterkin, RN, BSN   Supervising physician immediately available to respond to emergencies [facility] immediately available ER MD   Physician(s) Drs. Lord and [employer]   Medication changes reported     No   Fall or balance concerns reported    No   Warm-up and Cool-down Performed as group-led Location manager Performed Yes   VAD Patient? No     Pain Assessment   Currently in Pain? No/denies   Multiple Pain Sites No         Goals Met:  Proper associated with RPD/PD & O2 Sat Independence with exercise equipment Exercise tolerated well Strength training completed today  Goals Unmet:  Not Applicable  Comments: Pt able to follow exercise prescription today without complaint.  Will continue to monitor for progression.    Dr. Emily Filbert is Medical Director for [place] and [facility].

## 2015-12-22 NOTE — Progress Notes (Signed)
Daily Session Note  Patient Details  Name: Jeremiah Gibson MRN: [id number] Date of Birth: [birth year] Referring Provider:   Flowsheet Row Pulmonary Rehab from 11/23/2015 in [facility] Cardiac and Pulmonary Rehab  Referring Provider  [name]      Encounter Date: 12/22/2015  Check In:     Session Check In - 12/22/15 1137      Check-In   Location [facility]-Cardiac & Pulmonary Rehab   Staff Present Gerlene Burdock, RN, BSN;Laureen Owens Shark, BS, RRT, Respiratory Lennie Hummer, MA, ACSM RCEP, Exercise Physiologist   Supervising physician immediately available to respond to emergencies [facility] immediately available ER MD   Physician(s) Dr. Reita Cliche and Dr. Quentin Cornwall   Medication changes reported     No   Fall or balance concerns reported    No   Warm-up and Cool-down Performed as group-led instruction   Resistance Training Performed Yes   VAD Patient? No     Pain Assessment   Currently in Pain? No/denies         Goals Met:  Proper associated with RPD/PD & O2 Sat Exercise tolerated well  Goals Unmet:  Not Applicable  Comments:     Dr. Emily Filbert is Medical Director for [place] and [facility].

## 2015-12-24 ENCOUNTER — Encounter: Payer: Medicare Other | Admitting: *Deleted

## 2015-12-24 DIAGNOSIS — J849 Interstitial pulmonary disease, unspecified: Secondary | ICD-10-CM | POA: Diagnosis not present

## 2015-12-24 DIAGNOSIS — J8410 Pulmonary fibrosis, unspecified: Secondary | ICD-10-CM

## 2015-12-24 NOTE — Progress Notes (Signed)
Daily Session Note  Patient Details  Name: Jeremiah Gibson MRN: [id number] Date of Birth: 1935-09-18 Referring Provider:   April Manson Pulmonary Rehab from 11/23/2015 in [facility] Cardiac and Pulmonary Rehab  Referring Provider  [name]      Encounter Date: 12/24/2015  Check In:     Session Check In - 12/24/15 1144      Check-In   Location [facility]-Cardiac & Pulmonary Rehab   Staff Present Alberteen Sam, MA, ACSM RCEP, Exercise Physiologist;Carroll Enterkin, RN, BSN;Susanne Bice, RN, BSN, CCRP   Supervising physician immediately available to respond to emergencies [facility] immediately available ER MD   Physician(s) Drs. Kinner and [employer]   Medication changes reported     No   Fall or balance concerns reported    No   Warm-up and Cool-down Performed as group-led instruction   VAD Patient? No     Pain Assessment   Currently in Pain? No/denies   Multiple Pain Sites No         Goals Met:  Proper associated with RPD/PD & O2 Sat Independence with exercise equipment Using PLB without cueing & demonstrates good technique Exercise tolerated well Strength training completed today  Goals Unmet:  Not Applicable  Comments: Pt able to follow exercise prescription today without complaint.  Will continue to monitor for progression.    Dr. Emily Filbert is Medical Director for [place] and [facility].

## 2015-12-27 DIAGNOSIS — J8410 Pulmonary fibrosis, unspecified: Secondary | ICD-10-CM

## 2015-12-27 DIAGNOSIS — J849 Interstitial pulmonary disease, unspecified: Secondary | ICD-10-CM | POA: Diagnosis not present

## 2015-12-27 NOTE — Progress Notes (Signed)
Daily Session Note  Patient Details  Name: Jeremiah Gibson MRN: [id number] Date of Birth: 1936/01/07 Referring Provider:   Flowsheet Row Pulmonary Rehab from 11/23/2015 in [facility] Cardiac and Pulmonary Rehab  Referring Provider  [name]      Encounter Date: 12/27/2015  Check In:     Session Check In - 12/27/15 1301      Check-In   Location [facility]-Cardiac & Pulmonary Rehab   Staff Present Carson Myrtle, BS, RRT, Respiratory Therapist;Kelly Amedeo Plenty, BS, ACSM CEP, Exercise Physiologist;Byren Pankow Oletta Darter, BA, ACSM CEP, Exercise Physiologist   Supervising physician immediately available to respond to emergencies [facility] immediately available ER MD   Physician(s) Joni Fears and Jimmye Norman   Medication changes reported     No   Fall or balance concerns reported    No   Warm-up and Cool-down Performed as group-led Location manager Performed Yes   VAD Patient? No     Pain Assessment   Currently in Pain? No/denies         Goals Met:  Proper associated with RPD/PD & O2 Sat Independence with exercise equipment Exercise tolerated well Strength training completed today  Goals Unmet:  Not Applicable  Comments: Pt able to follow exercise prescription today without complaint.  Will continue to monitor for progression.    Dr. Emily Filbert is Medical Director for [place] and [facility].

## 2015-12-28 ENCOUNTER — Ambulatory Visit (INDEPENDENT_AMBULATORY_CARE_PROVIDER_SITE_OTHER): Payer: Medicare Other | Admitting: Family Medicine

## 2015-12-28 ENCOUNTER — Encounter: Payer: Self-pay | Admitting: Family Medicine

## 2015-12-28 VITALS — BP 112/60 | HR 82 | Temp 97.7°F | Resp 16 | Ht 70.5 in | Wt 216.0 lb

## 2015-12-28 DIAGNOSIS — N400 Enlarged prostate without lower urinary tract symptoms: Secondary | ICD-10-CM | POA: Diagnosis not present

## 2015-12-28 DIAGNOSIS — E785 Hyperlipidemia, unspecified: Secondary | ICD-10-CM | POA: Diagnosis not present

## 2015-12-28 DIAGNOSIS — I10 Essential (primary) hypertension: Secondary | ICD-10-CM

## 2015-12-28 DIAGNOSIS — Z23 Encounter for immunization: Secondary | ICD-10-CM

## 2015-12-28 DIAGNOSIS — H269 Unspecified cataract: Secondary | ICD-10-CM | POA: Insufficient documentation

## 2015-12-28 DIAGNOSIS — J849 Interstitial pulmonary disease, unspecified: Secondary | ICD-10-CM

## 2015-12-28 DIAGNOSIS — Z0000 Encounter for general adult medical examination without abnormal findings: Secondary | ICD-10-CM

## 2015-12-28 DIAGNOSIS — I2510 Atherosclerotic heart disease of native coronary artery without angina pectoris: Secondary | ICD-10-CM | POA: Diagnosis not present

## 2015-12-28 DIAGNOSIS — I700 Atherosclerosis of aorta: Secondary | ICD-10-CM | POA: Diagnosis not present

## 2015-12-28 DIAGNOSIS — E1121 Type 2 diabetes mellitus with diabetic nephropathy: Secondary | ICD-10-CM

## 2015-12-28 DIAGNOSIS — H259 Unspecified age-related cataract: Secondary | ICD-10-CM | POA: Diagnosis not present

## 2015-12-28 NOTE — Progress Notes (Signed)
Patient: Jeremiah Gibson, Male    DOB: 03-Nov-1935, 80 y.o.   MRN: [id number] Visit Date: 12/28/2015  Today's Provider: Mila Merryonald Adric Wrede, MD   Chief Complaint  Patient presents with  . Annual Exam  . Hypertension  . Diabetes  . Hyperlipidemia   Subjective:   This is a previous patient of Dr. Elease HashimotoMaloney present today as new patient to me to establish care and follow up on chronic medical problems.    Annual physical Jeremiah FeilVincent Callan is a 80 y.o. male. He feels well. He reports exercising 3 times a week. He reports he is sleeping well.  -----------------------------------------------------------  Diabetes Mellitus Type II, Follow-up:   Lab Results  Component Value Date   HGBA1C 7.9 08/10/2015   HGBA1C 7.8 05/11/2015   HGBA1C 7.9 02/09/2015   Last seen for diabetes 4 months ago.  Management since then includes no changes. He reports good compliance with treatment. He is not having side effects.  Current symptoms include none and have been stable. Home blood sugar records: fasting range: 150  Episodes of hypoglycemia? no   Current Insulin Regimen: none Most Recent Eye Exam: 1 year ago Weight trend: stable Prior visit with dietician: no Current diet: well balanced Current exercise: cardiovascular workout on exercise equipment  ------------------------------------------------------------------------   Hypertension, follow-up:  BP Readings from Last 3 Encounters:  10/11/15 (!) 154/84  08/10/15 132/64  08/06/15 (!) 150/80    He was last seen for hypertension 4 months ago.  BP at that visit was 132/64. Management since that visit includes no changes.He reports good compliance with treatment. He is not having side effects.  He is exercising. He is adherent to low salt diet.   Outside blood pressures are 114/60. He is experiencing none.  Patient denies chest pain, chest pressure/discomfort, claudication, dyspnea, exertional chest pressure/discomfort, fatigue,  irregular heart beat, lower extremity edema, near-syncope, orthopnea, palpitations, paroxysmal nocturnal dyspnea, syncope and tachypnea.   Cardiovascular risk factors include advanced age (older than 8055 for men, 3965 for women), diabetes mellitus, dyslipidemia, hypertension and male gender.  Use of agents associated with hypertension: NSAIDS.   ------------------------------------------------------------------------    Lipid/Cholesterol, Follow-up:   Last seen for this 7 months ago.  Management since that visit includes no changes.  Last Lipid Panel:    Component Value Date/Time   CHOL 177 05/11/2015 1127   TRIG 178 (H) 05/11/2015 1127   HDL 42 05/11/2015 1127   CHOLHDL 4.2 05/11/2015 1127   LDLCALC 99 05/11/2015 1127    He reports good compliance with treatment. He is not having side effects.   Wt Readings from Last 3 Encounters:  11/23/15 214 lb 8 oz (97.3 kg)  10/11/15 218 lb 3.2 oz (99 kg)  08/10/15 222 lb (100.7 kg)    ------------------------------------------------------------------------  BPH Follow up Continues on finasteride and terazosin states he is tolerating well and having very little difficulty voiding. No longer followed by urology.   Review of Systems  Constitutional: Negative for appetite change, chills, fatigue and fever.  HENT: Negative for congestion, ear pain, hearing loss, nosebleeds and trouble swallowing.   Eyes: Negative for pain and visual disturbance.  Respiratory: Negative for cough, chest tightness and shortness of breath.   Cardiovascular: Negative for chest pain, palpitations and leg swelling.  Gastrointestinal: Negative for abdominal pain, blood in stool, constipation, diarrhea, nausea and vomiting.  Endocrine: Negative for polydipsia, polyphagia and polyuria.  Genitourinary: Negative for dysuria and flank pain.  Musculoskeletal: Negative for arthralgias, back pain, joint  swelling, myalgias and neck stiffness.  Skin: Negative for color  change, rash and wound.  Neurological: Negative for dizziness, tremors, seizures, speech difficulty, weakness, light-headedness and headaches.  Psychiatric/Behavioral: Negative for behavioral problems, confusion, decreased concentration, dysphoric mood and sleep disturbance. The patient is not nervous/anxious.   All other systems reviewed and are negative.   Social History   Social History  . Marital status: Married    Spouse name: N/A  . Number of children: 2  . Years of education: N/A   Occupational History  . Retired in 2004    Social History Main Topics  . Smoking status: Former Smoker    Packs/day: 2.00    Years: 20.00    Types: Cigarettes, Pipe    Quit date: 11/22/1999  . Smokeless tobacco: Never Used     Comment: quit cigs in 72 and pipe in 01  . Alcohol use Yes     Comment: socially  . Drug use: No  . Sexual activity: Not on file   Other Topics Concern  . Not on file   Social History Narrative  . No narrative on file    Past Medical History:  Diagnosis Date  . Anemia   . Asthma   . Colon polyps   . Diverticulosis of colon (without mention of hemorrhage)   . Esophageal reflux   . Gout   . Hyperlipidemia   . Hypertension   . Impotence of organic origin   . Leg cramps   . Type II or unspecified type diabetes mellitus without mention of complication, not stated as uncontrolled      Patient Active Problem List   Diagnosis Date Noted  . Cardiac enlargement 08/04/2014  . DD (diverticular disease) 08/04/2014  . History of colon polyps 08/04/2014  . 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 Shortness of breath 11/27/2012  . Aortic arch atherosclerosis (HCC) 11/27/2012  . Coronary artery disease 11/27/2012  . Hyperlipidemia 11/27/2012  . ILD (interstitial lung disease) (HCC) 10/03/2011  . Cough 07/31/2011  . Airway hyperreactivity 03/22/2009  . Acid reflux 03/22/2009  . Gout 03/22/2009  . 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 Absolute anemia  01/01/2009  . 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 BP (high blood pressure) 01/01/2009  . Hypercholesteremia 01/01/2009  . Benign fibroma of prostate 01/01/2009    Past Surgical History:  Procedure Laterality Date  . APPENDECTOMY    . COLONOSCOPY    . CYST EXCISION    . ESOPHAGOGASTRODUODENOSCOPY ENDOSCOPY    . TONSILLECTOMY      His family history includes Heart disease in his father; Lung cancer (age of onset: 75) in his father; Lung cancer (age of onset: 40) in his mother.     Current Outpatient Prescriptions:  .  albuterol (VENTOLIN HFA) 108 (90 Base) MCG/ACT inhaler, Inhale 2 puffs into the lungs every 4 (four) hours as needed for wheezing or shortness of breath., Disp: 3 Inhaler, Rfl: 3 .  allopurinol (ZYLOPRIM) 100 MG tablet, Take 2 tablets (200 mg total) by mouth 2 (two) times daily., Disp: 360 tablet, Rfl: 3 .  amLODipine (NORVASC) 5 MG tablet, Take 1 tablet by mouth  every day for blood  pressure, Disp: 90 tablet, Rfl: 3 .  aspirin 81 MG tablet, Take 81 mg by mouth daily., Disp: , Rfl:  .  famotidine (PEPCID) 10 MG tablet, Take 1 tablet (10 mg total) by mouth daily., Disp: 90 tablet, Rfl: 3 .  finasteride (PROSCAR) 5 MG  tablet, Take 1 tablet by mouth  every day, Disp: 90 tablet, Rfl: 3 .  fish oil-omega-3 fatty acids 1000 MG capsule, Take 1 g by mouth 3 (three) times daily. , Disp: , Rfl:  .  Fluticasone-Salmeterol (ADVAIR DISKUS) 250-50 MCG/DOSE AEPB, Inhale 1 puff into the lungs 2 (two) times daily., Disp: 14 each, Rfl: 0 .  glipiZIDE (GLUCOTROL XL) 10 MG 24 hr tablet, Take 1 tablet by mouth  daily with food for  diabetes; take with largest meal of the day, Disp: 90 tablet, Rfl: 3 .  glucose blood test strip, Check sugar once daily. One Touch glucose strips. Dx: E11.9, Disp: 50 each, Rfl: 12 .  losartan (COZAAR) 100 MG tablet, Take 1 tablet by mouth  every day for blood  pressure, Disp: 90 tablet, Rfl: 3 .  Magnesium Citrate POWD, by Does  not apply route daily., Disp: , Rfl:  .  metFORMIN (GLUCOPHAGE) 500 MG tablet, Take 1 tablet (500 mg total) by mouth 2 (two) times daily with a meal., Disp: 180 tablet, Rfl: 1 .  MULTIPLE VITAMIN PO, MULTIVITAMINS (Oral Tablet)  1 Every Day for 0 days  Quantity: 0.00;  Refills: 0   Ordered :06-Jan-2010  [name] ;  Started 22-Mar-2009 Active, Disp: , Rfl:  .  Nintedanib (OFEV) 100 MG CAPS, Take 100 mg by mouth 2 (two) times daily., Disp: , Rfl:  .  omeprazole (PRILOSEC OTC) 20 MG tablet, Take 1 tablet (20 mg total) by mouth daily., Disp: 90 tablet, Rfl: 3 .  potassium chloride SA (K-DUR,KLOR-CON) 20 MEQ tablet, Take 1 tablet by mouth  daily, Disp: 90 tablet, Rfl: 3 .  simvastatin (ZOCOR) 10 MG tablet, Take 1 tablet by mouth  every day, Disp: 90 tablet, Rfl: 3 .  terazosin (HYTRIN) 5 MG capsule, Take 1 capsule by mouth  every day for prostate, Disp: 90 capsule, Rfl: 3 .  triamterene-hydrochlorothiazide (MAXZIDE-25) 37.5-25 MG tablet, Take 1 tablet by mouth  Every Day for blood  pressure, Disp: 90 tablet, Rfl: 3   Patient Care Team: Malva Limes, MD as PCP - General (Family Medicine) Oretha Milch, MD as Consulting Physician (Pulmonary Disease) Galen Manila, MD as Referring Physician (Ophthalmology)    Objective:   Vitals: BP 112/60 (BP Location: Right Arm, Patient Position: Sitting, Cuff Size: Large)   Pulse 82   Temp 97.7 F (36.5 C) (Oral)   Resp 16   Ht 5' 10.5" (1.791 m)   Wt 216 lb (98 kg)   SpO2 98% Comment: room air  BMI 30.55 kg/m   Physical Exam   General Appearance:    Alert, cooperative, no distress, appears stated age, obese  Head:    Normocephalic, without obvious abnormality, atraumatic  Eyes:    PERRL, conjunctiva/corneas clear, EOM's intact, fundi    benign, both eyes       Ears:    Normal TM's and external ear canals, both ears  Nose:   Nares normal, septum midline, mucosa normal, no drainage   or sinus tenderness  Throat:   Lips, mucosa, and  tongue normal; teeth and gums normal  Neck:   Supple, symmetrical, trachea midline, no adenopathy;       thyroid:  No enlargement/tenderness/nodules; no carotid   bruit or JVD  Back:     Symmetric, no curvature, ROM normal, no CVA tenderness  Lungs:     Clear to auscultation bilaterally, respirations unlabored  Chest wall:    No tenderness or deformity  Heart:  Regular rate and rhythm, S1 and S2 normal,  II/VI systolic murmur. No rubs or gallops.   Abdomen:     Soft, non-tender, bowel sounds active all four quadrants,    no masses, no organomegaly  Genitalia:    deferred  Rectal:    deferred  Extremities:   Extremities normal, atraumatic, no cyanosis or edema  Pulses:   2+ and symmetric all extremities  Skin:   Skin color, texture, turgor normal, no rashes or lesions  Lymph nodes:   Cervical, supraclavicular, and axillary nodes normal  Neurologic:   CNII-XII intact. Normal strength, sensation and reflexes      throughout    Activities of Daily Living In your present state of health, do you have any difficulty performing the following activities: 12/28/2015  Hearing? N  Vision? N  Difficulty concentrating or making decisions? N  Walking or climbing stairs? N  Dressing or bathing? N  Doing errands, shopping? N  Some recent data might be hidden    Fall Risk Assessment Fall Risk  12/28/2015 11/23/2015  Falls in the past year? No No     Depression Screen PHQ 2/9 Scores 12/28/2015 11/23/2015  PHQ - 2 Score 0 0  PHQ- 9 Score - 0    Cognitive Testing - 6-CIT  Correct? Score   What year is it? yes 0 0 or 4  What month is it? yes 0 0 or 3  Memorize:    Floyde Parkins,  44,  [address],  [place],      What time is it? (within 1 hour) yes 0 0 or 3  Count backwards from 20 yes 0 0, 2, or 4  Name the months of the year yes 0 0, 2, or 4  Repeat name & address above yes 0 0, 2, 4, 6, 8, or 10       TOTAL SCORE  0/28   Interpretation:  Normal  Normal (0-7) Abnormal (8-28)     Current Exercise Habits: Structured exercise class, Type of exercise: treadmill, Time (Minutes): 60, Frequency (Times/Week): 3, Weekly Exercise (Minutes/Week): 180, Intensity: Moderate Exercise limited by: None identified   Audit-C Alcohol Use Screening  Question Answer Points  How often do you have alcoholic drink? 2-4 times monthly 2  On days you do drink alcohol, how many drinks do you typically consume? 1 or 2 0  How oftey will you drink 6 or more in a total? never 0  Total Score:  2   A score of 3 or more in women, and 4 or more in men indicates increased risk for alcohol abuse, EXCEPT if all of the points are from question 1.   Assessment & Plan:     Annual Physical  Reviewed patient's Family Medical History Reviewed and updated list of patient's medical providers Assessment of cognitive impairment was done Assessed patient's functional ability Established a written schedule for health screening services Health Risk Assessent Completed and Reviewed  Exercise Activities and Dietary recommendations Goals    None      Immunization History  Administered Date(s) Administered  . Influenza Whole 12/12/2010, 12/12/2011  . Influenza, High Dose Seasonal PF 12/28/2015  . Influenza,inj,Quad PF,36+ Mos 11/25/2012  . Influenza-Unspecified 01/12/2015  . Pneumococcal Polysaccharide-23 03/14/2007  . Zoster 12/12/2010    Health Maintenance  Topic Date Due  . [name] Berlin  10/29/1956  . PNA vac Low Risk Adult (2 of 2 - PCV13) 03/13/2008  . INFLUENZA VACCINE  10/12/2015  . FOOT EXAM  11/04/2015  .  HEMOGLOBIN A1C  02/10/2016  . OPHTHALMOLOGY EXAM  04/08/2016  . ZOSTAVAX  Completed      Discussed health benefits of physical activity, and encouraged him to engage in regular exercise appropriate for his age and condition.    ------------------------------------------------------------------------------------------------------------  1. Annual physical exam   2.  Coronary artery disease involving native heart without angina pectoris, unspecified vessel or lesion type Asymptomatic. Compliant with medication.  Continue aggressive risk factor modification.    3. Hyperlipidemia, unspecified hyperlipidemia type He is tolerating simvastatin well with no adverse effects.    4. Diabetes mellitus with nephropathy (HCC)  - Lipid panel - Comprehensive metabolic panel - CBC - Hemoglobin A1c  5. Essential hypertension Well controlled.  Continue current medications.   - Lipid panel - Comprehensive metabolic panel - CBC - EKG 12-Lead  6. Need for influenza vaccination  - Flu vaccine HIGH DOSE PF (Fluzone High dose)  7. Age-related cataract of both eyes, unspecified age-related cataract type Followed by [facility]  8. Benign prostatic hyperplasia, unspecified whether lower urinary tract symptoms present Doing well on current medications.   9. Aortic arch atherosclerosis (HCC)   10. ILD (interstitial lung disease) (HCC) Now on Ofev by Dr. Vassie Loll.    Mila Merry, MD  [facility]

## 2015-12-29 ENCOUNTER — Telehealth: Payer: Self-pay

## 2015-12-29 ENCOUNTER — Telehealth: Payer: Self-pay | Admitting: *Deleted

## 2015-12-29 ENCOUNTER — Other Ambulatory Visit: Payer: Self-pay | Admitting: Family Medicine

## 2015-12-29 DIAGNOSIS — J8410 Pulmonary fibrosis, unspecified: Secondary | ICD-10-CM

## 2015-12-29 DIAGNOSIS — J849 Interstitial pulmonary disease, unspecified: Secondary | ICD-10-CM | POA: Diagnosis not present

## 2015-12-29 LAB — COMPREHENSIVE METABOLIC PANEL
A/G RATIO: 1.4 (ref 1.2–2.2)
ALBUMIN: 4.1 g/dL (ref 3.5–4.8)
ALK PHOS: 92 IU/L (ref 39–117)
ALT: 22 IU/L (ref 0–44)
AST: 18 IU/L (ref 0–40)
BILIRUBIN TOTAL: 0.4 mg/dL (ref 0.0–1.2)
BUN / CREAT RATIO: 20 (ref 10–24)
BUN: 24 mg/dL (ref 8–27)
CHLORIDE: 98 mmol/L (ref 96–106)
CO2: 27 mmol/L (ref 18–29)
Calcium: 9.9 mg/dL (ref 8.6–10.2)
Creatinine, Ser: 1.23 mg/dL (ref 0.76–1.27)
GFR calc non Af Amer: 56 mL/min/{1.73_m2} — ABNORMAL LOW (ref >59)
GFR, EST AFRICAN AMERICAN: 65 mL/min/{1.73_m2} (ref >59)
GLOBULIN, TOTAL: 3 g/dL (ref 1.5–4.5)
GLUCOSE: 215 mg/dL — AB (ref 65–99)
POTASSIUM: 3.8 mmol/L (ref 3.5–5.2)
SODIUM: 141 mmol/L (ref 134–144)
Total Protein: 7.1 g/dL (ref 6.0–8.5)

## 2015-12-29 LAB — LIPID PANEL
CHOLESTEROL TOTAL: 158 mg/dL (ref 100–199)
Chol/HDL Ratio: 4.1 ratio units (ref 0.0–5.0)
HDL: 39 mg/dL — AB (ref >39)
LDL Calculated: 91 mg/dL (ref 0–99)
TRIGLYCERIDES: 139 mg/dL (ref 0–149)
VLDL CHOLESTEROL CAL: 28 mg/dL (ref 5–40)

## 2015-12-29 LAB — CBC
Hematocrit: 37 % — ABNORMAL LOW (ref 37.5–51.0)
Hemoglobin: 12.3 g/dL — ABNORMAL LOW (ref 12.6–17.7)
MCH: 30.1 pg (ref 26.6–33.0)
MCHC: 33.2 g/dL (ref 31.5–35.7)
MCV: 91 fL (ref 79–97)
PLATELETS: 224 10*3/uL (ref 150–379)
RBC: 4.08 x10E6/uL — ABNORMAL LOW (ref 4.14–5.80)
RDW: 14.4 % (ref 12.3–15.4)
WBC: 7.5 10*3/uL (ref 3.4–10.8)

## 2015-12-29 LAB — HEMOGLOBIN A1C
ESTIMATED AVERAGE GLUCOSE: 200 mg/dL
Hgb A1c MFr Bld: 8.6 % — ABNORMAL HIGH (ref 4.8–5.6)

## 2015-12-29 MED ORDER — METFORMIN HCL 500 MG PO TABS: 1000.0000 mg | ORAL_TABLET | Freq: Two times a day (BID) | ORAL | 4 refills | Status: DC

## 2015-12-29 MED ORDER — SIMVASTATIN 20 MG PO TABS: 20.0000 mg | ORAL_TABLET | Freq: Every day | ORAL | 0 refills | Status: DC

## 2015-12-29 MED ORDER — METFORMIN HCL 500 MG PO TABS: 1000.0000 mg | ORAL_TABLET | Freq: Two times a day (BID) | ORAL | 0 refills | Status: DC

## 2015-12-29 NOTE — Telephone Encounter (Signed)
Patient's wife Cristy FriedlanderFlorence was notified of results. Expressed understanding. Rx's sent to pharmacy.

## 2015-12-29 NOTE — Telephone Encounter (Signed)
Ok to send into pharmacy?  

## 2015-12-29 NOTE — Progress Notes (Signed)
Daily Session Note  Patient Details  Name: Jeremiah Gibson MRN: [id number] Date of Birth: 02-Apr-1935 Referring Provider:   Flowsheet Row Pulmonary Rehab from 11/23/2015 in [facility] Cardiac and Pulmonary Rehab  Referring Provider  [name]      Encounter Date: 12/29/2015  Check In:     Session Check In - 12/29/15 1140      Check-In   Location [facility]-Cardiac & Pulmonary Rehab   Staff Present Carson Myrtle, BS, RRT, Respiratory Lennie Hummer, MA, ACSM RCEP, Exercise Physiologist;Amanda Oletta Darter, BA, ACSM CEP, Exercise Physiologist   Supervising physician immediately available to respond to emergencies [facility] immediately available ER MD   Physician(s) Quentin Cornwall and Joni Fears   Medication changes reported     No   Fall or balance concerns reported    No   Warm-up and Cool-down Performed as group-led Location manager Performed Yes   VAD Patient? No     Pain Assessment   Currently in Pain? No/denies   Multiple Pain Sites No         Goals Met:  Proper associated with RPD/PD & O2 Sat Independence with exercise equipment Exercise tolerated well Strength training completed today  Goals Unmet:  Not Applicable  Comments: Pt able to follow exercise prescription today without complaint.  Will continue to monitor for progression.    Dr. Emily Filbert is Medical Director for [place] and [facility].

## 2015-12-29 NOTE — Telephone Encounter (Signed)
Patient is requesting RX for Metformin be [facility]

## 2015-12-29 NOTE — Telephone Encounter (Signed)
-----   Message from Malva Limesonald E Fisher, MD sent at 12/29/2015  7:06 AM EDT ----- Diabetes is uncontrolled with A1c of 8.6. LDL cholesterol is too high at 91, needs to be under 70. Need to increase metformin to 2 x 500mg  tablet twice a day #120, rf x 3, and need to increase simvastatin to 20mg  a day, #30, rf x 3. Schedule follow up office visit 2 months.

## 2015-12-31 ENCOUNTER — Encounter: Payer: Medicare Other | Admitting: *Deleted

## 2015-12-31 DIAGNOSIS — J849 Interstitial pulmonary disease, unspecified: Secondary | ICD-10-CM | POA: Diagnosis not present

## 2015-12-31 DIAGNOSIS — J8410 Pulmonary fibrosis, unspecified: Secondary | ICD-10-CM

## 2015-12-31 NOTE — Progress Notes (Signed)
Daily Session Note  Patient Details  Name: Jeremiah Gibson MRN: [id number] Date of Birth: [birth year] Referring Provider:   Flowsheet Row Pulmonary Rehab from 11/23/2015 in [facility] Cardiac and Pulmonary Rehab  Referring Provider  [name]      Encounter Date: 12/31/2015  Check In:     Session Check In - 12/31/15 1216      Check-In   Location [facility]-Cardiac & Pulmonary Rehab   Staff Present Nyoka Cowden, RN, BSN, Willette Pa, MA, ACSM RCEP, Exercise Physiologist;Maddux Vanscyoc, RN, BSN   Supervising physician immediately available to respond to emergencies See telemetry face sheet for immediately available ER MD   Medication changes reported     No   Fall or balance concerns reported    No   Warm-up and Cool-down Performed as group-led instruction   Resistance Training Performed Yes   VAD Patient? No     Pain Assessment   Currently in Pain? No/denies         Goals Met:  Proper associated with RPD/PD & O2 Sat Exercise tolerated well  Goals Unmet:  Not Applicable  Comments:     Dr. Emily Filbert is Medical Director for [place] and [facility].

## 2016-01-03 DIAGNOSIS — J8410 Pulmonary fibrosis, unspecified: Secondary | ICD-10-CM

## 2016-01-03 DIAGNOSIS — J849 Interstitial pulmonary disease, unspecified: Secondary | ICD-10-CM | POA: Diagnosis not present

## 2016-01-03 NOTE — Progress Notes (Signed)
Daily Session Note  Patient Details  Name: Jeremiah Gibson MRN: [id number] Date of Birth: 1935-09-02 Referring Provider:   Flowsheet Row Pulmonary Rehab from 11/23/2015 in [facility] Cardiac and Pulmonary Rehab  Referring Provider  [name]      Encounter Date: 01/03/2016  Check In:     Session Check In - 01/03/16 1239      Check-In   Location [facility]-Cardiac & Pulmonary Rehab   Staff Present Carson Myrtle, BS, RRT, Respiratory Therapist;Kelly Amedeo Plenty, BS, ACSM CEP, Exercise Physiologist;Areon Cocuzza Oletta Darter, BA, ACSM CEP, Exercise Physiologist   Supervising physician immediately available to respond to emergencies [facility] immediately available ER MD   Physician(s) Kerman Passey and Yow   Medication changes reported     No   Fall or balance concerns reported    No   Warm-up and Cool-down Performed as group-led instruction   Resistance Training Performed Yes   VAD Patient? No     Pain Assessment   Currently in Pain? No/denies   Multiple Pain Sites No         Goals Met:  Proper associated with RPD/PD & O2 Sat Independence with exercise equipment Exercise tolerated well Strength training completed today  Goals Unmet:  Not Applicable  Comments: Pt able to follow exercise prescription today without complaint.  Will continue to monitor for progression.    Dr. Emily Filbert is Medical Director for [place] and [facility].

## 2016-01-05 DIAGNOSIS — J8410 Pulmonary fibrosis, unspecified: Secondary | ICD-10-CM

## 2016-01-05 DIAGNOSIS — J849 Interstitial pulmonary disease, unspecified: Secondary | ICD-10-CM | POA: Diagnosis not present

## 2016-01-05 NOTE — Progress Notes (Signed)
Daily Session Note  Patient Details  Name: Jeremiah Gibson MRN: [id number] Date of Birth: 09-Jul-1935 Referring Provider:   Flowsheet Row Pulmonary Rehab from 11/23/2015 in [facility] Cardiac and Pulmonary Rehab  Referring Provider  [name]      Encounter Date: 01/05/2016  Check In:     Session Check In - 01/05/16 1241      Check-In   Location [facility]-Cardiac & Pulmonary Rehab   Staff Present Carson Myrtle, BS, RRT, Respiratory Lennie Hummer, MA, ACSM RCEP, Exercise Physiologist;Ned Kakar Oletta Darter, BA, ACSM CEP, Exercise Physiologist   Supervising physician immediately available to respond to emergencies [facility] immediately available ER MD   Physician(s) Joni Fears and Jimmye Norman   Medication changes reported     No   Fall or balance concerns reported    No   Warm-up and Cool-down Performed as group-led Location manager Performed Yes   VAD Patient? No     Pain Assessment   Currently in Pain? No/denies   Multiple Pain Sites No         Goals Met:  Proper associated with RPD/PD & O2 Sat Independence with exercise equipment Exercise tolerated well Strength training completed today  Goals Unmet:  Not Applicable  Comments:      6 Minute Walk    Row Name 11/23/15 1236 01/05/16 1241       6 Minute Walk   Phase  - Mid Program    Distance 1320 feet 1425 feet    Distance % Change  - 7.5 %    Walk Time 6 minutes 6 minutes    # of Rest Breaks  - 0    MPH 2.5 2.69    METS 2.49 2.82    RPE 13 16    Perceived Dyspnea  3 4    VO2 Peak 8.7 9.8    Symptoms No No    Resting HR 85 bpm 88 bpm    Resting BP 124/76 126/74    Max Ex. HR 105 bpm 113 bpm    Max Ex. BP 144/64 152/56      Interval HR   Baseline HR 85 88    1 Minute HR 92 104    2 Minute HR 93 107    3 Minute HR 102  -    4 Minute HR 103 113    5 Minute HR 103 112    6 Minute HR 105 112    2 Minute Post HR 96 97    Interval Heart Rate? Yes Yes      Interval Oxygen   Interval Oxygen? Yes Yes     Baseline Oxygen Saturation % 97 % 94 %    1 Minute Oxygen Saturation % 93 % 94 %    2 Minute Oxygen Saturation % 88 % 94 %    3 Minute Oxygen Saturation % 89 %  -    4 Minute Oxygen Saturation % 90 % 94 %    5 Minute Oxygen Saturation % 91 % 94 %    6 Minute Oxygen Saturation % 93 % 94 %    2 Minute Post Oxygen Saturation % 96 % 94 %         Dr. Emily Filbert is Medical Director for [place] and [facility].

## 2016-01-10 ENCOUNTER — Encounter: Payer: Self-pay | Admitting: *Deleted

## 2016-01-10 ENCOUNTER — Encounter: Payer: Medicare Other | Admitting: *Deleted

## 2016-01-10 DIAGNOSIS — J8410 Pulmonary fibrosis, unspecified: Secondary | ICD-10-CM

## 2016-01-10 DIAGNOSIS — J849 Interstitial pulmonary disease, unspecified: Secondary | ICD-10-CM | POA: Diagnosis not present

## 2016-01-10 NOTE — Progress Notes (Signed)
Pulmonary Individual Treatment Plan  Patient Details  Name: Jeremiah Gibson MRN: [id number] Date of Birth: 1935/07/09 Referring Provider:   Flowsheet Row Pulmonary Rehab from 11/23/2015 in [facility] Cardiac and Pulmonary Rehab  Referring Provider  [name]      Initial Encounter Date:  Flowsheet Row Pulmonary Rehab from 11/23/2015 in [facility] Cardiac and Pulmonary Rehab  Date  11/23/15  Referring Provider  [name]      Visit Diagnosis: Pulmonary fibrosis ([place])  Patient's Home Medications on Admission:  Current Outpatient Prescriptions:  .  albuterol (VENTOLIN HFA) 108 (90 Base) MCG/ACT inhaler, Inhale 2 puffs into the lungs every 4 (four) hours as needed for wheezing or shortness of breath., Disp: 3 Inhaler, Rfl: 3 .  allopurinol (ZYLOPRIM) 100 MG tablet, Take 2 tablets (200 mg total) by mouth 2 (two) times daily., Disp: 360 tablet, Rfl: 3 .  amLODipine (NORVASC) 5 MG tablet, Take 1 tablet by mouth  every day for blood  pressure, Disp: 90 tablet, Rfl: 3 .  aspirin 81 MG tablet, Take 81 mg by mouth daily., Disp: , Rfl:  .  famotidine (PEPCID) 10 MG tablet, Take 1 tablet (10 mg total) by mouth daily., Disp: 90 tablet, Rfl: 3 .  finasteride (PROSCAR) 5 MG tablet, Take 1 tablet by mouth  every day, Disp: 90 tablet, Rfl: 3 .  fish oil-omega-3 fatty acids 1000 MG capsule, Take 1 g by mouth 3 (three) times daily. , Disp: , Rfl:  .  Fluticasone-Salmeterol (ADVAIR DISKUS) 250-50 MCG/DOSE AEPB, Inhale 1 puff into the lungs 2 (two) times daily., Disp: 14 each, Rfl: 0 .  glipiZIDE (GLUCOTROL XL) 10 MG 24 hr tablet, Take 1 tablet by mouth  daily with food for  diabetes; take with largest meal of the day, Disp: 90 tablet, Rfl: 3 .  glucose blood test strip, Check sugar once daily. One Touch glucose strips. Dx: E11.9, Disp: 50 each, Rfl: 12 .  losartan (COZAAR) 100 MG tablet, Take 1 tablet by mouth  every day for blood  pressure, Disp: 90 tablet, Rfl: 3 .  Magnesium Citrate POWD, by Does not apply route daily., Disp:  , Rfl:  .  metFORMIN (GLUCOPHAGE) 500 MG tablet, Take 2 tablets (1,000 mg total) by mouth 2 (two) times daily with a meal., Disp: 360 tablet, Rfl: 4 .  MULTIPLE VITAMIN PO, MULTIVITAMINS (Oral Tablet)  1 Every Day for 0 days  Quantity: 0.00;  Refills: 0   Ordered :06-Jan-2010  Doy Hutching ;  Started 22-Mar-2009 Active, Disp: , Rfl:  .  Nintedanib (OFEV) 100 MG CAPS, Take 100 mg by mouth 2 (two) times daily., Disp: , Rfl:  .  omeprazole (PRILOSEC OTC) 20 MG tablet, Take 1 tablet (20 mg total) by mouth daily., Disp: 90 tablet, Rfl: 3 .  potassium chloride SA (K-DUR,KLOR-CON) 20 MEQ tablet, Take 1 tablet by mouth  daily, Disp: 90 tablet, Rfl: 3 .  simvastatin (ZOCOR) 20 MG tablet, Take 1 tablet (20 mg total) by mouth daily., Disp: 90 tablet, Rfl: 0 .  terazosin (HYTRIN) 5 MG capsule, Take 1 capsule by mouth  every day for prostate, Disp: 90 capsule, Rfl: 3 .  triamterene-hydrochlorothiazide (MAXZIDE-25) 37.5-25 MG tablet, Take 1 tablet by mouth  Every Day for blood  pressure, Disp: 90 tablet, Rfl: 3  Past Medical History: Past Medical History:  Diagnosis Date  . Anemia   . Asthma   . Colon polyps   . Diverticulosis of colon (without mention of hemorrhage)   . Esophageal reflux   .  Gout   . Impotence of organic origin   . Leg cramps     Tobacco Use: History  Smoking Status  . Former Smoker  . Packs/day: 2.00  . Years: 20.00  . Types: Cigarettes, Pipe  . Quit date: 11/22/1999  Smokeless Tobacco  . Never Used    Comment: quit cigs in 72 and pipe in 01    Labs: Recent Review Flowsheet Data    Labs for ITP Cardiac and Pulmonary Rehab Latest Ref Rng & Units 11/04/2014 02/09/2015 05/11/2015 08/10/2015 12/28/2015   Cholestrol 100 - 199 mg/dL - - 177 - 158   LDLCALC 0 - 99 mg/dL - - 99 - 91   HDL >39 mg/dL - - 42 - 39(L)   Trlycerides 0 - 149 mg/dL - - 178(H) - 139   Hemoglobin A1c 4.8 - 5.6 % 7.7 7.9 7.8 7.9 8.6(H)       ADL UCSD:     Pulmonary Assessment Scores    Row Name  11/23/15 1311 01/05/16 1512       ADL UCSD   ADL Phase Entry Mid    SOB Score total 8 32    Rest 0 0    Walk 0 1    Stairs 1 3    Bath 0 1    Dress 0 2    Shop 0 1       Pulmonary Function Assessment:     Pulmonary Function Assessment - 11/23/15 1310      Initial Spirometry Results   FVC% 86 %   FEV1% 98 %   FEV1/FVC Ratio 82   Comments Test date 07/20/15     Post Bronchodilator Spirometry Results   FVC% 81 %   FEV1% 93 %   FEV1/FVC Ratio 82     Breath   Bilateral Breath Sounds Basilar;Rales   Shortness of Breath Yes      Exercise Target Goals:    Exercise Program Goal: Individual exercise prescription set with THRR, safety & activity barriers. Participant demonstrates ability to understand and report RPE using BORG scale, to self-measure pulse accurately, and to acknowledge the importance of the exercise prescription.  Exercise Prescription Goal: Starting with aerobic activity 30 plus minutes a day, 3 days per week for initial exercise prescription. Provide home exercise prescription and guidelines that participant acknowledges understanding prior to discharge.  Activity Barriers & Risk Stratification:     Activity Barriers & Cardiac Risk Stratification - 11/23/15 1309      Activity Barriers & Cardiac Risk Stratification   Activity Barriers Shortness of Breath;Deconditioning   Cardiac Risk Stratification Moderate      6 Minute Walk:     6 Minute Walk    Row Name 11/23/15 1236 01/05/16 1241       6 Minute Walk   Phase  - Mid Program    Distance 1320 feet 1425 feet    Distance % Change  - 7.5 %    Walk Time 6 minutes 6 minutes    # of Rest Breaks  - 0    MPH 2.5 2.69    METS 2.49 2.82    RPE 13 16    Perceived Dyspnea  3 4    VO2 Peak 8.7 9.8    Symptoms No No    Resting HR 85 bpm 88 bpm    Resting BP 124/76 126/74    Max Ex. HR 105 bpm 113 bpm    Max Ex. BP 144/64 152/56  Interval HR   Baseline HR 85 88    1 Minute HR 92 104    2  Minute HR 93 107    3 Minute HR 102  -    4 Minute HR 103 113    5 Minute HR 103 112    6 Minute HR 105 112    2 Minute Post HR 96 97    Interval Heart Rate? Yes Yes      Interval Oxygen   Interval Oxygen? Yes Yes    Baseline Oxygen Saturation % 97 % 94 %    1 Minute Oxygen Saturation % 93 % 94 %    2 Minute Oxygen Saturation % 88 % 94 %    3 Minute Oxygen Saturation % 89 %  -    4 Minute Oxygen Saturation % 90 % 94 %    5 Minute Oxygen Saturation % 91 % 94 %    6 Minute Oxygen Saturation % 93 % 94 %    2 Minute Post Oxygen Saturation % 96 % 94 %       Initial Exercise Prescription:     Initial Exercise Prescription - 11/23/15 1200      Date of Initial Exercise RX and Referring Provider   Date 11/23/15   Referring Provider [name]     Treadmill   MPH 1.7   Grade 0.5   Minutes 15   METs 2.42     Recumbant Bike   Level 1   RPM 60   Minutes 15   METs 2.3     NuStep   Level 2   Minutes 15   METs 2     T5 Nustep   Level 1   Minutes 15   METs 2     Biostep-RELP   Level 2   Minutes 15   METs 2     Prescription Details   Frequency (times per week) 3   Duration Progress to 45 minutes of aerobic exercise without signs/symptoms of physical distress     Intensity   THRR 40-80% of Max Heartrate 107-130   Ratings of Perceived Exertion 11-13   Perceived Dyspnea 0-4     Progression   Progression Continue to progress workloads to maintain intensity without signs/symptoms of physical distress.     Resistance Training   Training Prescription Yes   Weight 2   Reps 10-15      Perform Capillary Blood Glucose checks as needed.  Exercise Prescription Changes:     Exercise Prescription Changes    Row Name 11/29/15 1400 12/08/15 1300 12/22/15 1500 01/05/16 1400       Exercise Review   Progression  - Yes Yes Yes      Response to Exercise   Blood Pressure (Admit) 120/70 120/62 136/76 126/74    Blood Pressure (Exercise) 134/82 148/82 126/64 152/86    Blood  Pressure (Exit) 138/64 134/78 116/64 126/66    Heart Rate (Admit) 76 bpm 94 bpm 95 bpm 94 bpm    Heart Rate (Exercise) 98 bpm 94 bpm 115 bpm 95 bpm    Heart Rate (Exit) 74 bpm 88 bpm 86 bpm 98 bpm    Oxygen Saturation (Admit) 97 % 95 % 97 % 88 %    Oxygen Saturation (Exercise) 97 % 95 % 90 % 96 %    Oxygen Saturation (Exit) 98 % 96 % 97 % 98 %    Rating of Perceived Exertion (Exercise) 15 13 17  17  Perceived Dyspnea (Exercise) 4 3 5 4     Symptoms none none none none    Duration Progress to 45 minutes of aerobic exercise without signs/symptoms of physical distress Progress to 45 minutes of aerobic exercise without signs/symptoms of physical distress Progress to 45 minutes of aerobic exercise without signs/symptoms of physical distress Progress to 45 minutes of aerobic exercise without signs/symptoms of physical distress    Intensity THRR unchanged THRR unchanged THRR unchanged THRR unchanged      Progression   Progression Continue to progress workloads to maintain intensity without signs/symptoms of physical distress. Continue to progress workloads to maintain intensity without signs/symptoms of physical distress. Continue to progress workloads to maintain intensity without signs/symptoms of physical distress. Continue to progress workloads to maintain intensity without signs/symptoms of physical distress.    Average METs  -  - 2.05 2.2      Resistance Training   Training Prescription Yes Yes Yes Yes    Weight 4 4 4  lbs 4 lbs    Reps 10-15 10-15 10-12 10-12      Interval Training   Interval Training No No No No      Treadmill   MPH 1  -  -  -    Grade 0.5  -  -  -    Minutes 10  5/5  -  -  -      Arm/Foot Ergometer   Level  - 4 4 4     Watts  -  -  - -  50 rpm    Minutes  - 15 15 15       T5 Nustep   Level 2 2 4 5   80 spm    Minutes 15 15 15 15     METs  - 2 2.1 2.3      Biostep-RELP   Level 2 2 4 5   40 spm    Minutes 15 15 15 15     METs  - 2 2 2        Exercise  Comments:     Exercise Comments    Row Name 11/29/15 1503 12/08/15 1313 12/22/15 1554 01/05/16 1450     Exercise Comments Mr Pierre did well his first day of exercise.  Exercise equipment safety, infection control, and diabetes, high and low blood sugar signs were reveiwed. Bonner is progressing well with exercise. Courtez is making good progress in rehab.  He is now up to level 4 on T5 and BioStep.  We wll continue to monitor his progression. Jacobus completed his mid 6 min walk test today.  He has already impoved by 105 ft (7.5%)!!  We will continue to monitor his progress.       Discharge Exercise Prescription (Final Exercise Prescription Changes):     Exercise Prescription Changes - 01/05/16 1400      Exercise Review   Progression Yes     Response to Exercise   Blood Pressure (Admit) 126/74   Blood Pressure (Exercise) 152/86   Blood Pressure (Exit) 126/66   Heart Rate (Admit) 94 bpm   Heart Rate (Exercise) 95 bpm   Heart Rate (Exit) 98 bpm   Oxygen Saturation (Admit) 88 %   Oxygen Saturation (Exercise) 96 %   Oxygen Saturation (Exit) 98 %   Rating of Perceived Exertion (Exercise) 17   Perceived Dyspnea (Exercise) 4   Symptoms none   Duration Progress to 45 minutes of aerobic exercise without signs/symptoms of physical distress   Intensity THRR unchanged  Progression   Progression Continue to progress workloads to maintain intensity without signs/symptoms of physical distress.   Average METs 2.2     Resistance Training   Training Prescription Yes   Weight 4 lbs   Reps 10-12     Interval Training   Interval Training No     Arm/Foot Ergometer   Level 4   Watts --  50 rpm   Minutes 15     T5 Nustep   Level 5  80 spm   Minutes 15   METs 2.3     Biostep-RELP   Level 5  40 spm   Minutes 15   METs 2       Nutrition:  Target Goals: Understanding of nutrition guidelines, daily intake of sodium <1521m, cholesterol <201m calories 30% from fat and 7%  or less from saturated fats, daily to have 5 or more servings of fruits and vegetables.  Biometrics:     Pre Biometrics - 11/23/15 1234      Pre Biometrics   Height 5' 10"  (1.778 m)   Weight 214 lb 8 oz (97.3 kg)   Waist Circumference 44 inches   Hip Circumference 43 inches   Waist to Hip Ratio 1.02 %   BMI (Calculated) 30.8       Nutrition Therapy Plan and Nutrition Goals:   Nutrition Discharge: Rate Your Plate Scores:   Psychosocial: Target Goals: Acknowledge presence or absence of depression, maximize coping skills, provide positive support system. Participant is able to verbalize types and ability to use techniques and skills needed for reducing stress and depression.  Initial Review & Psychosocial Screening:     Initial Psych Review & Screening - 11/23/15 [place]   Comments Mr BrArmitageas great support from his wife. He has accepted his pulmonary fibrosis and is very positive about managing the disease.     Barriers   Psychosocial barriers to participate in program The patient should benefit from training in stress management and relaxation.     Screening Interventions   Interventions Encouraged to exercise      Quality of Life Scores:     Quality of Life - 11/23/15 1333      Quality of Life Scores   Health/Function Pre 20.19 %   Socioeconomic Pre 20.4 %   Psych/Spiritual Pre 20.29 %   Family Pre 20 %   GLOBAL Pre 20.21 %      PHQ-9: Recent Review Flowsheet Data    [facility] 12/28/2015 11/23/2015   Decreased Interest 0 0   Down, Depressed, Hopeless 0 0   PHQ - 2 Score 0 0   Altered sleeping - 0   Tired, decreased energy - 0   Change in appetite - 0   Feeling bad or failure about yourself  - 0   Trouble concentrating - 0   Moving slowly or fidgety/restless - 0   Suicidal thoughts - 0   PHQ-9 Score - 0      Psychosocial Evaluation and Intervention:     Psychosocial Evaluation -  12/01/15 1131      Psychosocial Evaluation & Interventions   Interventions Encouraged to exercise with the program and follow exercise prescription   Comments Counselor met with Mr. BrZehneroday for initial psychosocial evaluation.  He is a [age]ear old who has been recently diagnosed with pulmonary fibrosis.  He has a strong support system with a spouse  of 53 years; friends close by & active involvement in his local church community.  Mr. Doyle has other health issues that are challenging in addition to the pulmonary fibrosis; he has diabetes; high cholesterol and high blood pressure.  He states he sleeps well and has a good appetite.  He denies a history or current symptoms of depresssion or anxiety and is typically in a positive mood.  Mr. Pettry reports minimal stress in his life other than his health issues.  He has goals to breathe better while in this program and plans to continue exercising with his silver sneakers class upon completion in this pulmonary rehab class.        Psychosocial Re-Evaluation:  Education: Education Goals: Education classes will be provided on a weekly basis, covering required topics. Participant will state understanding/return demonstration of topics presented.  Learning Barriers/Preferences:     Learning Barriers/Preferences - 11/23/15 1310      Learning Barriers/Preferences   Learning Barriers None   Learning Preferences Group Instruction;Individual Instruction;Pictoral;Skilled Demonstration;Verbal Instruction;Video;Written Material      Education Topics: Initial Evaluation Education: - Verbal, written and demonstration of respiratory meds, RPE/PD scales, oximetry and breathing techniques. Instruction on use of nebulizers and MDIs: cleaning and proper use, rinsing mouth with steroid doses and importance of monitoring MDI activations. Flowsheet Row Pulmonary Rehab from 12/27/2015 in [facility] Cardiac and Pulmonary Rehab  Date  11/23/15  Educator  LB   Instruction Review Code  2- meets goals/outcomes      General Nutrition Guidelines/Fats and Fiber: -Group instruction provided by verbal, written material, models and posters to present the general guidelines for heart healthy nutrition. Gives an explanation and review of dietary fats and fiber.   Controlling Sodium/Reading Food Labels: -Group verbal and written material supporting the discussion of sodium use in heart healthy nutrition. Review and explanation with models, verbal and written materials for utilization of the food label.   Exercise Physiology & Risk Factors: - Group verbal and written instruction with models to review the exercise physiology of the cardiovascular system and associated critical values. Details cardiovascular disease risk factors and the goals associated with each risk factor.   Aerobic Exercise & Resistance Training: - Gives group verbal and written discussion on the health impact of inactivity. On the components of aerobic and resistive training programs and the benefits of this training and how to safely progress through these programs. Flowsheet Row Pulmonary Rehab from 12/27/2015 in [facility] Cardiac and Pulmonary Rehab  Date  12/01/15  Educator  Nada Maclachlan  Instruction Review Code  2- meets goals/outcomes      Flexibility, Balance, General Exercise Guidelines: - Provides group verbal and written instruction on the benefits of flexibility and balance training programs. Provides general exercise guidelines with specific guidelines to those with heart or lung disease. Demonstration and skill practice provided.   Stress Management: - Provides group verbal and written instruction about the health risks of elevated stress, cause of high stress, and healthy ways to reduce stress.   Depression: - Provides group verbal and written instruction on the correlation between heart/lung disease and depressed mood, treatment options, and the stigmas associated  with seeking treatment.   Exercise & Equipment Safety: - Individual verbal instruction and demonstration of equipment use and safety with use of the equipment. Flowsheet Row Pulmonary Rehab from 12/27/2015 in [facility] Cardiac and Pulmonary Rehab  Date  11/29/15  Educator  AS  Instruction Review Code  2- meets goals/outcomes      Infection Prevention: -  Provides verbal and written material to individual with discussion of infection control including proper hand washing and proper equipment cleaning during exercise session. Flowsheet Row Pulmonary Rehab from 12/27/2015 in [facility] Cardiac and Pulmonary Rehab  Date  11/29/15  Educator  AS  Instruction Review Code  2- meets goals/outcomes      Falls Prevention: - Provides verbal and written material to individual with discussion of falls prevention and safety. Flowsheet Row Pulmonary Rehab from 12/27/2015 in [facility] Cardiac and Pulmonary Rehab  Date  11/23/15  Educator  LB  Instruction Review Code  2- meets goals/outcomes      Diabetes: - Individual verbal and written instruction to review signs/symptoms of diabetes, desired ranges of glucose level fasting, after meals and with exercise. Advice that pre and post exercise glucose checks will be done for 3 sessions at entry of program. Flowsheet Row Pulmonary Rehab from 12/27/2015 in [facility] Cardiac and Pulmonary Rehab  Date  11/29/15  Educator  AS  Instruction Review Code  2- meets goals/outcomes      Chronic Lung Diseases: - Group verbal and written instruction to review new updates, new respiratory medications, new advancements in procedures and treatments. Provide informative websites and "800" numbers of self-education. Flowsheet Row Pulmonary Rehab from 12/27/2015 in [facility] Cardiac and Pulmonary Rehab  Date  12/27/15  Educator  LB  Instruction Review Code  2- meets goals/outcomes      Lung Procedures: - Group verbal and written instruction to describe testing methods done to diagnose  lung disease. Review the outcome of test results. Describe the treatment choices: Pulmonary Function Tests, ABGs and oximetry.   Energy Conservation: - Provide group verbal and written instruction for methods to conserve energy, plan and organize activities. Instruct on pacing techniques, use of adaptive equipment and posture/positioning to relieve shortness of breath.   Triggers: - Group verbal and written instruction to review types of environmental controls: home humidity, furnaces, filters, dust mite/pet prevention, HEPA vacuums. To discuss weather changes, air quality and the benefits of nasal washing.   Exacerbations: - Group verbal and written instruction to provide: warning signs, infection symptoms, calling MD promptly, preventive modes, and value of vaccinations. Review: effective airway clearance, coughing and/or vibration techniques. Create an Sports administrator.   Oxygen: - Individual and group verbal and written instruction on oxygen therapy. Includes supplement oxygen, available portable oxygen systems, continuous and intermittent flow rates, oxygen safety, concentrators, and Medicare reimbursement for oxygen.   Respiratory Medications: - Group verbal and written instruction to review medications for lung disease. Drug class, frequency, complications, importance of spacers, rinsing mouth after steroid MDI's, and proper cleaning methods for nebulizers. Flowsheet Row Pulmonary Rehab from 12/27/2015 in [facility] Cardiac and Pulmonary Rehab  Date  11/23/15  Educator  LB  Instruction Review Code  2- meets goals/outcomes      AED/CPR: - Group verbal and written instruction with the use of models to demonstrate the basic use of the AED with the basic ABC's of resuscitation.   Breathing Retraining: - Provides individuals verbal and written instruction on purpose, frequency, and proper technique of diaphragmatic breathing and pursed-lipped breathing. Applies individual practice  skills. Flowsheet Row Pulmonary Rehab from 12/27/2015 in [facility] Cardiac and Pulmonary Rehab  Date  11/23/15  Educator  LB  Instruction Review Code  2- meets goals/outcomes      Anatomy and Physiology of the Lungs: - Group verbal and written instruction with the use of models to provide basic lung anatomy and physiology related to function, structure  and complications of lung disease.   Heart Failure: - Group verbal and written instruction on the basics of heart failure: signs/symptoms, treatments, explanation of ejection fraction, enlarged heart and cardiomyopathy.   Sleep Apnea: - Individual verbal and written instruction to review Obstructive Sleep Apnea. Review of risk factors, methods for diagnosing and types of masks and machines for OSA.   Anxiety: - Provides group, verbal and written instruction on the correlation between heart/lung disease and anxiety, treatment options, and management of anxiety. Flowsheet Row Pulmonary Rehab from 12/27/2015 in [facility] Cardiac and Pulmonary Rehab  Date  12/15/15  Educator  [facility]  Instruction Review Code  2- Meets goals/outcomes      Relaxation: - Provides group, verbal and written instruction about the benefits of relaxation for patients with heart/lung disease. Also provides patients with examples of relaxation techniques. Flowsheet Row Pulmonary Rehab from 12/27/2015 in [facility] Cardiac and Pulmonary Rehab  Date  12/15/15  Educator  [facility]  Instruction Review Code  2- Meets goals/outcomes      Knowledge Questionnaire Score:     Knowledge Questionnaire Score - 11/23/15 1310      Knowledge Questionnaire Score   Pre Score 8/10       Core Components/Risk Factors/Patient Goals at Admission:     Personal Goals and Risk Factors at Admission - 11/23/15 1319      Core Components/Risk Factors/Patient Goals on Admission    Weight Management Yes   Intervention Weight Management: Develop a combined nutrition and exercise program designed to reach  desired caloric intake, while maintaining appropriate intake of nutrient and fiber, sodium and fats, and appropriate energy expenditure required for the weight goal.  Mr Rio prefers not to meet with the dietitian. His wife is a good , healthy cook. He does like bread, but not sweets.   Admit Weight 214 lb 6.4 oz (97.3 kg)   Goal Weight: Short Term 209 lb (94.8 kg)   Goal Weight: Long Term 204 lb (92.5 kg)   Expected Outcomes Understanding of distribution of calorie intake throughout the day with the consumption of 4-5 meals/snacks   Sedentary Yes   Intervention Provide advice, education, support and counseling about physical activity/exercise needs.;Develop an individualized exercise prescription for aerobic and resistive training based on initial evaluation findings, risk stratification, comorbidities and participant's personal goals.  He does the Silver Sneakers class twice a week..   Expected Outcomes Achievement of increased cardiorespiratory fitness and enhanced flexibility, muscular endurance and strength shown through measurements of functional capacity and personal statement of participant.   Increase Strength and Stamina Yes   Intervention Provide advice, education, support and counseling about physical activity/exercise needs.;Develop an individualized exercise prescription for aerobic and resistive training based on initial evaluation findings, risk stratification, comorbidities and participant's personal goals.   Expected Outcomes Achievement of increased cardiorespiratory fitness and enhanced flexibility, muscular endurance and strength shown through measurements of functional capacity and personal statement of participant.   Improve shortness of breath with ADL's Yes   Intervention Provide education, individualized exercise plan and daily activity instruction to help decrease symptoms of SOB with activities of daily living.   Expected Outcomes Short Term: Achieves a reduction of  symptoms when performing activities of daily living.   Develop more efficient breathing techniques such as purse lipped breathing and diaphragmatic breathing; and practicing self-pacing with activity Yes   Intervention Provide education, demonstration and support about specific breathing techniuqes utilized for more efficient breathing. Include techniques such as pursed lipped breathing, diaphragmatic breathing and  self-pacing activity.   Expected Outcomes Short Term: Participant will be able to demonstrate and use breathing techniques as needed throughout daily activities.   Increase knowledge of respiratory medications and ability to use respiratory devices properly  Yes   Intervention Provide education and demonstration as needed of appropriate use of medications, inhalers, and oxygen therapy.  Advair and Proventil; spacer given   Expected Outcomes Short Term: Achieves understanding of medications use. Understands that oxygen is a medication prescribed by physician. Demonstrates appropriate use of inhaler and oxygen therapy.   Diabetes Yes   Intervention Provide education about signs/symptoms and action to take for hypo/hyperglycemia.;Provide education about proper nutrition, including hydration, and aerobic/resistive exercise prescription along with prescribed medications to achieve blood glucose in normal ranges: Fasting glucose 65-99 mg/dL   Expected Outcomes Short Term: Participant verbalizes understanding of the signs/symptoms and immediate care of hyper/hypoglycemia, proper foot care and importance of medication, aerobic/resistive exercise and nutrition plan for blood glucose control.;Long Term: Attainment of HbA1C < 7%.   Hypertension Yes   Intervention Provide education on lifestyle modifcations including regular physical activity/exercise, weight management, moderate sodium restriction and increased consumption of fresh fruit, vegetables, and low fat dairy, alcohol moderation, and smoking  cessation.;Monitor prescription use compliance.   Expected Outcomes Short Term: Continued assessment and intervention until BP is < 140/19m HG in hypertensive participants. < 130/838mHG in hypertensive participants with diabetes, heart failure or chronic kidney disease.;Long Term: Maintenance of blood pressure at goal levels.      Core Components/Risk Factors/Patient Goals Review:      Goals and Risk Factor Review    Row Name 12/01/15 1243 12/10/15 1250 12/10/15 1252 12/10/15 1253 12/29/15 1157     Core Components/Risk Factors/Patient Goals Review   Personal Goals Review Weight Management/Obesity;Sedentary;Increase Strength and Stamina;Hypertension;Diabetes Increase knowledge of respiratory medications and ability to use respiratory devices properly. Improve shortness of breath with ADL's Develop more efficient breathing techniques such as purse lipped breathing and diaphragmatic breathing and practicing self-pacing with activity. Weight Management/Obesity;Sedentary;Diabetes;Hypertension   Review ViMuadhad a good first day of exercise.  He was doing silver sneakers before, but not while in program.  His blood pressure was good here, but he does not check it at home.  His blood sugars have been good at home and here. He was down 1/2 lb today! Mr. BrSpringerakes an Albuterol MDI and Advair at home for his breathing.  He has a spacer for the albuterol and knows to rinse his mouth after taking the Advair. Mr. BrKoskelaas not seen any improventment in his SOB.  He is only on his seventh session. Mr. BrAlbaneseemonstrates proper technique for PLB. ViErselas not lost weight but is maintaining his current weight.  He consistently attends class 3 days per week and has not yet missed a session.  He never had trouble with ADLs excpet walking up hills or stairs.  Bp has been within normal limits when he comes to class.. Marland KitchenHis a1c level was higher yesterday at the Dr but he feels it may be due to a new medication he  takes for pukmonary fibrosis.  He will follow up with his Dr about this.   Expected Outcomes ViTameronill continue to come to exercise and education classes for risk factor modification.  We continue to monitor for progression. Taking his respiratory medications correctly will help reduces SOB and exacerbations.  Continued cardio and strength training should start to help Mr. BrHolzhauermprove on his SOB and make  ADLs easier for him. PLB will help Mr. Meas regain his breath during exercise and episodes of SOB.  Kimm will maintain his attendance and see his weight stay the same or go down.  His other vitals - BP and BG will remain well controlled.   [place] Name 01/05/16 1515             Core Components/Risk Factors/Patient Goals Review   Personal Goals Review Increase Strength and Stamina;Increase knowledge of respiratory medications and ability to use respiratory devices properly.;Develop more efficient breathing techniques such as purse lipped breathing and diaphragmatic breathing and practicing self-pacing with activity.       Review Mr Baley has a good understanding of his MDI's Advair and Proventil. He is taking OFEV for his Pulmonary Fibrosis and has tolerated the medicine well. Mr Mundie uses PLB with his exercise goals and activities at home. His mid 65md improved 1080f- minimal importance difference for IPF is [id number]         Core Components/Risk Factors/Patient Goals at Discharge (Final Review):      Goals and Risk Factor Review - 01/05/16 1515      Core Components/Risk Factors/Patient Goals Review   Personal Goals Review Increase Strength and Stamina;Increase knowledge of respiratory medications and ability to use respiratory devices properly.;Develop more efficient breathing techniques such as purse lipped breathing and diaphragmatic breathing and practicing self-pacing with activity.   Review Mr BroScheibers a good understanding of his MDI's Advair and Proventil. He is taking  OFEV for his Pulmonary Fibrosis and has tolerated the medicine well. Mr BroRamirezes PLB with his exercise goals and activities at home. His mid 6mw54mmproved 105ft36finimal importance difference for IPF is 78.72-147ft.52f  ITP Comments:     ITP Comments    Row Name 12/08/15 1313           ITP Comments VincenJaquayogressing well with exercise.          Comments: 30 Day Review

## 2016-01-10 NOTE — Progress Notes (Signed)
Daily Session Note  Patient Details  Name: Jeremiah Gibson MRN: [id number] Date of Birth: 11-09-1935 Referring Provider:   [facility] from 11/23/2015 in [facility]  Referring Provider  [name]      Encounter Date: 01/10/2016  Check In:     Session Check In - 01/10/16 1321      Check-In   Location [facility]   Staff Present Gerlene Burdock, RN, BSN;Laureen Owens Shark, BS, RRT, Respiratory Therapist;Jolean Madariaga Amedeo Plenty, BS, ACSM CEP, Exercise Physiologist   Supervising physician immediately available to respond to emergencies [facility] immediately available ER MD   Physician(s) Drs. Kinner and Armstrong   Medication changes reported     No   Fall or balance concerns reported    No   Warm-up and Cool-down Performed on first and last piece of equipment   Resistance Training Performed Yes   VAD Patient? No     Pain Assessment   Currently in Pain? No/denies   Multiple Pain Sites No         Goals Met:  Proper associated with RPD/PD & O2 Sat Independence with exercise equipment Exercise tolerated well Strength training completed today  Goals Unmet:  Not Applicable  Comments: Pt able to follow exercise prescription today without complaint.  Will continue to monitor for progression.    Dr. Emily Filbert is Medical Director for [facility] and [facility].

## 2016-01-10 NOTE — Progress Notes (Signed)
Pulmonary Individual Treatment Plan  Patient Details  Name: Naser Schuld MRN: [id number] Date of Birth: [birth year] Referring Provider:   Flowsheet Row Pulmonary Rehab from 11/23/2015 in [facility] Cardiac and Pulmonary Rehab  Referring Provider  [name]      Initial Encounter Date:  Flowsheet Row Pulmonary Rehab from 11/23/2015 in [facility] Cardiac and Pulmonary Rehab  Date  11/23/15  Referring Provider  [name]      Visit Diagnosis: Pulmonary fibrosis ([place])  Patient's Home Medications on Admission:  Current Outpatient Prescriptions:  .  albuterol (VENTOLIN HFA) 108 (90 Base) MCG/ACT inhaler, Inhale 2 puffs into the lungs every 4 (four) hours as needed for wheezing or shortness of breath., Disp: 3 Inhaler, Rfl: 3 .  allopurinol (ZYLOPRIM) 100 MG tablet, Take 2 tablets (200 mg total) by mouth 2 (two) times daily., Disp: 360 tablet, Rfl: 3 .  amLODipine (NORVASC) 5 MG tablet, Take 1 tablet by mouth  every day for blood  pressure, Disp: 90 tablet, Rfl: 3 .  aspirin 81 MG tablet, Take 81 mg by mouth daily., Disp: , Rfl:  .  famotidine (PEPCID) 10 MG tablet, Take 1 tablet (10 mg total) by mouth daily., Disp: 90 tablet, Rfl: 3 .  finasteride (PROSCAR) 5 MG tablet, Take 1 tablet by mouth  every day, Disp: 90 tablet, Rfl: 3 .  fish oil-omega-3 fatty acids 1000 MG capsule, Take 1 g by mouth 3 (three) times daily. , Disp: , Rfl:  .  Fluticasone-Salmeterol (ADVAIR DISKUS) 250-50 MCG/DOSE AEPB, Inhale 1 puff into the lungs 2 (two) times daily., Disp: 14 each, Rfl: 0 .  glipiZIDE (GLUCOTROL XL) 10 MG 24 hr tablet, Take 1 tablet by mouth  daily with food for  diabetes; take with largest meal of the day, Disp: 90 tablet, Rfl: 3 .  glucose blood test strip, Check sugar once daily. One Touch glucose strips. Dx: E11.9, Disp: 50 each, Rfl: 12 .  losartan (COZAAR) 100 MG tablet, Take 1 tablet by mouth  every day for blood  pressure, Disp: 90 tablet, Rfl: 3 .  Magnesium Citrate POWD, by Does not apply route daily., Disp:  , Rfl:  .  metFORMIN (GLUCOPHAGE) 500 MG tablet, Take 2 tablets (1,000 mg total) by mouth 2 (two) times daily with a meal., Disp: 360 tablet, Rfl: 4 .  MULTIPLE VITAMIN PO, MULTIVITAMINS (Oral Tablet)  1 Every Day for 0 days  Quantity: 0.00;  Refills: 0   Ordered :06-Jan-2010  Doy Hutching ;  Started 22-Mar-2009 Active, Disp: , Rfl:  .  Nintedanib (OFEV) 100 MG CAPS, Take 100 mg by mouth 2 (two) times daily., Disp: , Rfl:  .  omeprazole (PRILOSEC OTC) 20 MG tablet, Take 1 tablet (20 mg total) by mouth daily., Disp: 90 tablet, Rfl: 3 .  potassium chloride SA (K-DUR,KLOR-CON) 20 MEQ tablet, Take 1 tablet by mouth  daily, Disp: 90 tablet, Rfl: 3 .  simvastatin (ZOCOR) 20 MG tablet, Take 1 tablet (20 mg total) by mouth daily., Disp: 90 tablet, Rfl: 0 .  terazosin (HYTRIN) 5 MG capsule, Take 1 capsule by mouth  every day for prostate, Disp: 90 capsule, Rfl: 3 .  triamterene-hydrochlorothiazide (MAXZIDE-25) 37.5-25 MG tablet, Take 1 tablet by mouth  Every Day for blood  pressure, Disp: 90 tablet, Rfl: 3  Past Medical History: Past Medical History:  Diagnosis Date  . Anemia   . Asthma   . Colon polyps   . Diverticulosis of colon (without mention of hemorrhage)   . Esophageal reflux   .  Gout   . Impotence of organic origin   . Leg cramps     Tobacco Use: History  Smoking Status  . Former Smoker  . Packs/day: 2.00  . Years: 20.00  . Types: Cigarettes, Pipe  . Quit date: 11/22/1999  Smokeless Tobacco  . Never Used    Comment: quit cigs in 72 and pipe in 01    Labs: Recent Review Flowsheet Data    Labs for ITP Cardiac and Pulmonary Rehab Latest Ref Rng & Units 11/04/2014 02/09/2015 05/11/2015 08/10/2015 12/28/2015   Cholestrol 100 - 199 mg/dL - - 177 - 158   LDLCALC 0 - 99 mg/dL - - 99 - 91   HDL >39 mg/dL - - 42 - 39(L)   Trlycerides 0 - 149 mg/dL - - 178(H) - 139   Hemoglobin A1c 4.8 - 5.6 % 7.7 7.9 7.8 7.9 8.6(H)       ADL UCSD:     Pulmonary Assessment Scores    Row Name  11/23/15 1311 01/05/16 1512       ADL UCSD   ADL Phase Entry Mid    SOB Score total 8 32    Rest 0 0    Walk 0 1    Stairs 1 3    Bath 0 1    Dress 0 2    Shop 0 1       Pulmonary Function Assessment:     Pulmonary Function Assessment - 11/23/15 1310      Initial Spirometry Results   FVC% 86 %   FEV1% 98 %   FEV1/FVC Ratio 82   Comments Test date 07/20/15     Post Bronchodilator Spirometry Results   FVC% 81 %   FEV1% 93 %   FEV1/FVC Ratio 82     Breath   Bilateral Breath Sounds Basilar;Rales   Shortness of Breath Yes      Exercise Target Goals:    Exercise Program Goal: Individual exercise prescription set with THRR, safety & activity barriers. Participant demonstrates ability to understand and report RPE using BORG scale, to self-measure pulse accurately, and to acknowledge the importance of the exercise prescription.  Exercise Prescription Goal: Starting with aerobic activity 30 plus minutes a day, 3 days per week for initial exercise prescription. Provide home exercise prescription and guidelines that participant acknowledges understanding prior to discharge.  Activity Barriers & Risk Stratification:     Activity Barriers & Cardiac Risk Stratification - 11/23/15 1309      Activity Barriers & Cardiac Risk Stratification   Activity Barriers Shortness of Breath;Deconditioning   Cardiac Risk Stratification Moderate      6 Minute Walk:     6 Minute Walk    Row Name 11/23/15 1236 01/05/16 1241       6 Minute Walk   Phase  - Mid Program    Distance 1320 feet 1425 feet    Distance % Change  - 7.5 %    Walk Time 6 minutes 6 minutes    # of Rest Breaks  - 0    MPH 2.5 2.69    METS 2.49 2.82    RPE 13 16    Perceived Dyspnea  3 4    VO2 Peak 8.7 9.8    Symptoms No No    Resting HR 85 bpm 88 bpm    Resting BP 124/76 126/74    Max Ex. HR 105 bpm 113 bpm    Max Ex. BP 144/64 152/56  Interval HR   Baseline HR 85 88    1 Minute HR 92 104    2  Minute HR 93 107    3 Minute HR 102  -    4 Minute HR 103 113    5 Minute HR 103 112    6 Minute HR 105 112    2 Minute Post HR 96 97    Interval Heart Rate? Yes Yes      Interval Oxygen   Interval Oxygen? Yes Yes    Baseline Oxygen Saturation % 97 % 94 %    1 Minute Oxygen Saturation % 93 % 94 %    2 Minute Oxygen Saturation % 88 % 94 %    3 Minute Oxygen Saturation % 89 %  -    4 Minute Oxygen Saturation % 90 % 94 %    5 Minute Oxygen Saturation % 91 % 94 %    6 Minute Oxygen Saturation % 93 % 94 %    2 Minute Post Oxygen Saturation % 96 % 94 %       Initial Exercise Prescription:     Initial Exercise Prescription - 11/23/15 1200      Date of Initial Exercise RX and Referring Provider   Date 11/23/15   Referring Provider [name]     Treadmill   MPH 1.7   Grade 0.5   Minutes 15   METs 2.42     Recumbant Bike   Level 1   RPM 60   Minutes 15   METs 2.3     NuStep   Level 2   Minutes 15   METs 2     T5 Nustep   Level 1   Minutes 15   METs 2     Biostep-RELP   Level 2   Minutes 15   METs 2     Prescription Details   Frequency (times per week) 3   Duration Progress to 45 minutes of aerobic exercise without signs/symptoms of physical distress     Intensity   THRR 40-80% of Max Heartrate 107-130   Ratings of Perceived Exertion 11-13   Perceived Dyspnea 0-4     Progression   Progression Continue to progress workloads to maintain intensity without signs/symptoms of physical distress.     Resistance Training   Training Prescription Yes   Weight 2   Reps 10-15      Perform Capillary Blood Glucose checks as needed.  Exercise Prescription Changes:     Exercise Prescription Changes    Row Name 11/29/15 1400 12/08/15 1300 12/22/15 1500 01/05/16 1400       Exercise Review   Progression  - Yes Yes Yes      Response to Exercise   Blood Pressure (Admit) 120/70 120/62 136/76 126/74    Blood Pressure (Exercise) 134/82 148/82 126/64 152/86    Blood  Pressure (Exit) 138/64 134/78 116/64 126/66    Heart Rate (Admit) 76 bpm 94 bpm 95 bpm 94 bpm    Heart Rate (Exercise) 98 bpm 94 bpm 115 bpm 95 bpm    Heart Rate (Exit) 74 bpm 88 bpm 86 bpm 98 bpm    Oxygen Saturation (Admit) 97 % 95 % 97 % 88 %    Oxygen Saturation (Exercise) 97 % 95 % 90 % 96 %    Oxygen Saturation (Exit) 98 % 96 % 97 % 98 %    Rating of Perceived Exertion (Exercise) 15 13 17  17  Perceived Dyspnea (Exercise) 4 3 5 4     Symptoms none none none none    Duration Progress to 45 minutes of aerobic exercise without signs/symptoms of physical distress Progress to 45 minutes of aerobic exercise without signs/symptoms of physical distress Progress to 45 minutes of aerobic exercise without signs/symptoms of physical distress Progress to 45 minutes of aerobic exercise without signs/symptoms of physical distress    Intensity THRR unchanged THRR unchanged THRR unchanged THRR unchanged      Progression   Progression Continue to progress workloads to maintain intensity without signs/symptoms of physical distress. Continue to progress workloads to maintain intensity without signs/symptoms of physical distress. Continue to progress workloads to maintain intensity without signs/symptoms of physical distress. Continue to progress workloads to maintain intensity without signs/symptoms of physical distress.    Average METs  -  - 2.05 2.2      Resistance Training   Training Prescription Yes Yes Yes Yes    Weight 4 4 4  lbs 4 lbs    Reps 10-15 10-15 10-12 10-12      Interval Training   Interval Training No No No No      Treadmill   MPH 1  -  -  -    Grade 0.5  -  -  -    Minutes 10  5/5  -  -  -      Arm/Foot Ergometer   Level  - 4 4 4     Watts  -  -  - -  50 rpm    Minutes  - 15 15 15       T5 Nustep   Level 2 2 4 5   80 spm    Minutes 15 15 15 15     METs  - 2 2.1 2.3      Biostep-RELP   Level 2 2 4 5   40 spm    Minutes 15 15 15 15     METs  - 2 2 2        Exercise  Comments:     Exercise Comments    Row Name 11/29/15 1503 12/08/15 1313 12/22/15 1554 01/05/16 1450     Exercise Comments Mr Allston did well his first day of exercise.  Exercise equipment safety, infection control, and diabetes, high and low blood sugar signs were reveiwed. Victorio is progressing well with exercise. Kaidence is making good progress in rehab.  He is now up to level 4 on T5 and BioStep.  We wll continue to monitor his progression. Benz completed his mid 6 min walk test today.  He has already impoved by 105 ft (7.5%)!!  We will continue to monitor his progress.       Discharge Exercise Prescription (Final Exercise Prescription Changes):     Exercise Prescription Changes - 01/05/16 1400      Exercise Review   Progression Yes     Response to Exercise   Blood Pressure (Admit) 126/74   Blood Pressure (Exercise) 152/86   Blood Pressure (Exit) 126/66   Heart Rate (Admit) 94 bpm   Heart Rate (Exercise) 95 bpm   Heart Rate (Exit) 98 bpm   Oxygen Saturation (Admit) 88 %   Oxygen Saturation (Exercise) 96 %   Oxygen Saturation (Exit) 98 %   Rating of Perceived Exertion (Exercise) 17   Perceived Dyspnea (Exercise) 4   Symptoms none   Duration Progress to 45 minutes of aerobic exercise without signs/symptoms of physical distress   Intensity THRR unchanged  Progression   Progression Continue to progress workloads to maintain intensity without signs/symptoms of physical distress.   Average METs 2.2     Resistance Training   Training Prescription Yes   Weight 4 lbs   Reps 10-12     Interval Training   Interval Training No     Arm/Foot Ergometer   Level 4   Watts --  50 rpm   Minutes 15     T5 Nustep   Level 5  80 spm   Minutes 15   METs 2.3     Biostep-RELP   Level 5  40 spm   Minutes 15   METs 2       Nutrition:  Target Goals: Understanding of nutrition guidelines, daily intake of sodium <1518m, cholesterol <2056m calories 30% from fat and 7%  or less from saturated fats, daily to have 5 or more servings of fruits and vegetables.  Biometrics:     Pre Biometrics - 11/23/15 1234      Pre Biometrics   Height 5' 10"  (1.778 m)   Weight 214 lb 8 oz (97.3 kg)   Waist Circumference 44 inches   Hip Circumference 43 inches   Waist to Hip Ratio 1.02 %   BMI (Calculated) 30.8       Nutrition Therapy Plan and Nutrition Goals:   Nutrition Discharge: Rate Your Plate Scores:   Psychosocial: Target Goals: Acknowledge presence or absence of depression, maximize coping skills, provide positive support system. Participant is able to verbalize types and ability to use techniques and skills needed for reducing stress and depression.  Initial Review & Psychosocial Screening:     Initial Psych Review & Screening - 11[place]23/15 [place]   Comments Mr BrHearnas great support from his wife. He has accepted his pulmonary fibrosis and is very positive about managing the disease.     Barriers   Psychosocial barriers to participate in program The patient should benefit from training in stress management and relaxation.     Screening Interventions   Interventions Encouraged to exercise      Quality of Life Scores:     Quality of Life - 11/23/15 1333      Quality of Life Scores   Health/Function Pre 20.19 %   Socioeconomic Pre 20.4 %   Psych/Spiritual Pre 20.29 %   Family Pre 20 %   GLOBAL Pre 20.21 %      PHQ-9: Recent Review Flowsheet Data    [facility] 12/28/2015 11/23/2015   Decreased Interest 0 0   Down, Depressed, Hopeless 0 0   PHQ - 2 Score 0 0   Altered sleeping - 0   Tired, decreased energy - 0   Change in appetite - 0   Feeling bad or failure about yourself  - 0   Trouble concentrating - 0   Moving slowly or fidgety/restless - 0   Suicidal thoughts - 0   PHQ-9 Score - 0      Psychosocial Evaluation and Intervention:     Psychosocial Evaluation -  12/01/15 1131      Psychosocial Evaluation & Interventions   Interventions Encouraged to exercise with the program and follow exercise prescription   Comments Counselor met with Mr. BrSeyleroday for initial psychosocial evaluation.  He is a [age]ear old who has been recently diagnosed with pulmonary fibrosis.  He has a strong support system with a spouse  of 53 years; friends close by & active involvement in his local church community.  Mr. Wik has other health issues that are challenging in addition to the pulmonary fibrosis; he has diabetes; high cholesterol and high blood pressure.  He states he sleeps well and has a good appetite.  He denies a history or current symptoms of depresssion or anxiety and is typically in a positive mood.  Mr. Rago reports minimal stress in his life other than his health issues.  He has goals to breathe better while in this program and plans to continue exercising with his silver sneakers class upon completion in this pulmonary rehab class.        Psychosocial Re-Evaluation:  Education: Education Goals: Education classes will be provided on a weekly basis, covering required topics. Participant will state understanding/return demonstration of topics presented.  Learning Barriers/Preferences:     Learning Barriers/Preferences - 11/23/15 1310      Learning Barriers/Preferences   Learning Barriers None   Learning Preferences Group Instruction;Individual Instruction;Pictoral;Skilled Demonstration;Verbal Instruction;Video;Written Material      Education Topics: Initial Evaluation Education: - Verbal, written and demonstration of respiratory meds, RPE/PD scales, oximetry and breathing techniques. Instruction on use of nebulizers and MDIs: cleaning and proper use, rinsing mouth with steroid doses and importance of monitoring MDI activations. Flowsheet Row Pulmonary Rehab from 12/27/2015 in [facility] Cardiac and Pulmonary Rehab  Date  11/23/15  Educator  LB   Instruction Review Code  2- meets goals/outcomes      General Nutrition Guidelines/Fats and Fiber: -Group instruction provided by verbal, written material, models and posters to present the general guidelines for heart healthy nutrition. Gives an explanation and review of dietary fats and fiber.   Controlling Sodium/Reading Food Labels: -Group verbal and written material supporting the discussion of sodium use in heart healthy nutrition. Review and explanation with models, verbal and written materials for utilization of the food label.   Exercise Physiology & Risk Factors: - Group verbal and written instruction with models to review the exercise physiology of the cardiovascular system and associated critical values. Details cardiovascular disease risk factors and the goals associated with each risk factor.   Aerobic Exercise & Resistance Training: - Gives group verbal and written discussion on the health impact of inactivity. On the components of aerobic and resistive training programs and the benefits of this training and how to safely progress through these programs. Flowsheet Row Pulmonary Rehab from 12/27/2015 in [facility] Cardiac and Pulmonary Rehab  Date  12/01/15  Educator  Nada Maclachlan  Instruction Review Code  2- meets goals/outcomes      Flexibility, Balance, General Exercise Guidelines: - Provides group verbal and written instruction on the benefits of flexibility and balance training programs. Provides general exercise guidelines with specific guidelines to those with heart or lung disease. Demonstration and skill practice provided.   Stress Management: - Provides group verbal and written instruction about the health risks of elevated stress, cause of high stress, and healthy ways to reduce stress.   Depression: - Provides group verbal and written instruction on the correlation between heart/lung disease and depressed mood, treatment options, and the stigmas associated  with seeking treatment.   Exercise & Equipment Safety: - Individual verbal instruction and demonstration of equipment use and safety with use of the equipment. Flowsheet Row Pulmonary Rehab from 12/27/2015 in [facility] Cardiac and Pulmonary Rehab  Date  11/29/15  Educator  AS  Instruction Review Code  2- meets goals/outcomes      Infection Prevention: -  Provides verbal and written material to individual with discussion of infection control including proper hand washing and proper equipment cleaning during exercise session. Flowsheet Row Pulmonary Rehab from 12/27/2015 in [facility] Cardiac and Pulmonary Rehab  Date  11/29/15  Educator  AS  Instruction Review Code  2- meets goals/outcomes      Falls Prevention: - Provides verbal and written material to individual with discussion of falls prevention and safety. Flowsheet Row Pulmonary Rehab from 12/27/2015 in [facility] Cardiac and Pulmonary Rehab  Date  11/23/15  Educator  LB  Instruction Review Code  2- meets goals/outcomes      Diabetes: - Individual verbal and written instruction to review signs/symptoms of diabetes, desired ranges of glucose level fasting, after meals and with exercise. Advice that pre and post exercise glucose checks will be done for 3 sessions at entry of program. Flowsheet Row Pulmonary Rehab from 12/27/2015 in [facility] Cardiac and Pulmonary Rehab  Date  11/29/15  Educator  AS  Instruction Review Code  2- meets goals/outcomes      Chronic Lung Diseases: - Group verbal and written instruction to review new updates, new respiratory medications, new advancements in procedures and treatments. Provide informative websites and "800" numbers of self-education. Flowsheet Row Pulmonary Rehab from 12/27/2015 in [facility] Cardiac and Pulmonary Rehab  Date  12/27/15  Educator  LB  Instruction Review Code  2- meets goals/outcomes      Lung Procedures: - Group verbal and written instruction to describe testing methods done to diagnose  lung disease. Review the outcome of test results. Describe the treatment choices: Pulmonary Function Tests, ABGs and oximetry.   Energy Conservation: - Provide group verbal and written instruction for methods to conserve energy, plan and organize activities. Instruct on pacing techniques, use of adaptive equipment and posture/positioning to relieve shortness of breath.   Triggers: - Group verbal and written instruction to review types of environmental controls: home humidity, furnaces, filters, dust mite/pet prevention, HEPA vacuums. To discuss weather changes, air quality and the benefits of nasal washing.   Exacerbations: - Group verbal and written instruction to provide: warning signs, infection symptoms, calling MD promptly, preventive modes, and value of vaccinations. Review: effective airway clearance, coughing and/or vibration techniques. Create an Sports administrator.   Oxygen: - Individual and group verbal and written instruction on oxygen therapy. Includes supplement oxygen, available portable oxygen systems, continuous and intermittent flow rates, oxygen safety, concentrators, and Medicare reimbursement for oxygen.   Respiratory Medications: - Group verbal and written instruction to review medications for lung disease. Drug class, frequency, complications, importance of spacers, rinsing mouth after steroid MDI's, and proper cleaning methods for nebulizers. Flowsheet Row Pulmonary Rehab from 12/27/2015 in [facility] Cardiac and Pulmonary Rehab  Date  11/23/15  Educator  LB  Instruction Review Code  2- meets goals/outcomes      AED/CPR: - Group verbal and written instruction with the use of models to demonstrate the basic use of the AED with the basic ABC's of resuscitation.   Breathing Retraining: - Provides individuals verbal and written instruction on purpose, frequency, and proper technique of diaphragmatic breathing and pursed-lipped breathing. Applies individual practice  skills. Flowsheet Row Pulmonary Rehab from 12/27/2015 in [facility] Cardiac and Pulmonary Rehab  Date  11/23/15  Educator  LB  Instruction Review Code  2- meets goals/outcomes      Anatomy and Physiology of the Lungs: - Group verbal and written instruction with the use of models to provide basic lung anatomy and physiology related to function, structure  and complications of lung disease.   Heart Failure: - Group verbal and written instruction on the basics of heart failure: signs/symptoms, treatments, explanation of ejection fraction, enlarged heart and cardiomyopathy.   Sleep Apnea: - Individual verbal and written instruction to review Obstructive Sleep Apnea. Review of risk factors, methods for diagnosing and types of masks and machines for OSA.   Anxiety: - Provides group, verbal and written instruction on the correlation between heart/lung disease and anxiety, treatment options, and management of anxiety. Flowsheet Row Pulmonary Rehab from 12/27/2015 in [facility] Cardiac and Pulmonary Rehab  Date  12/15/15  Educator  [facility]  Instruction Review Code  2- Meets goals/outcomes      Relaxation: - Provides group, verbal and written instruction about the benefits of relaxation for patients with heart/lung disease. Also provides patients with examples of relaxation techniques. Flowsheet Row Pulmonary Rehab from 12/27/2015 in [facility] Cardiac and Pulmonary Rehab  Date  12/15/15  Educator  [facility]  Instruction Review Code  2- Meets goals/outcomes      Knowledge Questionnaire Score:     Knowledge Questionnaire Score - 11/23/15 1310      Knowledge Questionnaire Score   Pre Score 8/10       Core Components/Risk Factors/Patient Goals at Admission:     Personal Goals and Risk Factors at Admission - 11/23/15 1319      Core Components/Risk Factors/Patient Goals on Admission    Weight Management Yes   Intervention Weight Management: Develop a combined nutrition and exercise program designed to reach  desired caloric intake, while maintaining appropriate intake of nutrient and fiber, sodium and fats, and appropriate energy expenditure required for the weight goal.  Mr Paulos prefers not to meet with the dietitian. His wife is a good , healthy cook. He does like bread, but not sweets.   Admit Weight 214 lb 6.4 oz (97.3 kg)   Goal Weight: Short Term 209 lb (94.8 kg)   Goal Weight: Long Term 204 lb (92.5 kg)   Expected Outcomes Understanding of distribution of calorie intake throughout the day with the consumption of 4-5 meals/snacks   Sedentary Yes   Intervention Provide advice, education, support and counseling about physical activity/exercise needs.;Develop an individualized exercise prescription for aerobic and resistive training based on initial evaluation findings, risk stratification, comorbidities and participant's personal goals.  He does the Silver Sneakers class twice a week..   Expected Outcomes Achievement of increased cardiorespiratory fitness and enhanced flexibility, muscular endurance and strength shown through measurements of functional capacity and personal statement of participant.   Increase Strength and Stamina Yes   Intervention Provide advice, education, support and counseling about physical activity/exercise needs.;Develop an individualized exercise prescription for aerobic and resistive training based on initial evaluation findings, risk stratification, comorbidities and participant's personal goals.   Expected Outcomes Achievement of increased cardiorespiratory fitness and enhanced flexibility, muscular endurance and strength shown through measurements of functional capacity and personal statement of participant.   Improve shortness of breath with ADL's Yes   Intervention Provide education, individualized exercise plan and daily activity instruction to help decrease symptoms of SOB with activities of daily living.   Expected Outcomes Short Term: Achieves a reduction of  symptoms when performing activities of daily living.   Develop more efficient breathing techniques such as purse lipped breathing and diaphragmatic breathing; and practicing self-pacing with activity Yes   Intervention Provide education, demonstration and support about specific breathing techniuqes utilized for more efficient breathing. Include techniques such as pursed lipped breathing, diaphragmatic breathing and  self-pacing activity.   Expected Outcomes Short Term: Participant will be able to demonstrate and use breathing techniques as needed throughout daily activities.   Increase knowledge of respiratory medications and ability to use respiratory devices properly  Yes   Intervention Provide education and demonstration as needed of appropriate use of medications, inhalers, and oxygen therapy.  Advair and Proventil; spacer given   Expected Outcomes Short Term: Achieves understanding of medications use. Understands that oxygen is a medication prescribed by physician. Demonstrates appropriate use of inhaler and oxygen therapy.   Diabetes Yes   Intervention Provide education about signs/symptoms and action to take for hypo/hyperglycemia.;Provide education about proper nutrition, including hydration, and aerobic/resistive exercise prescription along with prescribed medications to achieve blood glucose in normal ranges: Fasting glucose 65-99 mg/dL   Expected Outcomes Short Term: Participant verbalizes understanding of the signs/symptoms and immediate care of hyper/hypoglycemia, proper foot care and importance of medication, aerobic/resistive exercise and nutrition plan for blood glucose control.;Long Term: Attainment of HbA1C < 7%.   Hypertension Yes   Intervention Provide education on lifestyle modifcations including regular physical activity/exercise, weight management, moderate sodium restriction and increased consumption of fresh fruit, vegetables, and low fat dairy, alcohol moderation, and smoking  cessation.;Monitor prescription use compliance.   Expected Outcomes Short Term: Continued assessment and intervention until BP is < 140/53m HG in hypertensive participants. < 130/832mHG in hypertensive participants with diabetes, heart failure or chronic kidney disease.;Long Term: Maintenance of blood pressure at goal levels.      Core Components/Risk Factors/Patient Goals Review:      Goals and Risk Factor Review    Row Name 12/01/15 1243 12/10/15 1250 12/10/15 1252 12/10/15 1253 12/29/15 1157     Core Components/Risk Factors/Patient Goals Review   Personal Goals Review Weight Management/Obesity;Sedentary;Increase Strength and Stamina;Hypertension;Diabetes Increase knowledge of respiratory medications and ability to use respiratory devices properly. Improve shortness of breath with ADL's Develop more efficient breathing techniques such as purse lipped breathing and diaphragmatic breathing and practicing self-pacing with activity. Weight Management/Obesity;Sedentary;Diabetes;Hypertension   Review ViOthelload a good first day of exercise.  He was doing silver sneakers before, but not while in program.  His blood pressure was good here, but he does not check it at home.  His blood sugars have been good at home and here. He was down 1/2 lb today! Mr. BrKnechtelakes an Albuterol MDI and Advair at home for his breathing.  He has a spacer for the albuterol and knows to rinse his mouth after taking the Advair. Mr. BrWickstromas not seen any improventment in his SOB.  He is only on his seventh session. Mr. BrMortellaroemonstrates proper technique for PLB. ViLendellas not lost weight but is maintaining his current weight.  He consistently attends class 3 days per week and has not yet missed a session.  He never had trouble with ADLs excpet walking up hills or stairs.  Bp has been within normal limits when he comes to class.. Marland KitchenHis a1c level was higher yesterday at the Dr but he feels it may be due to a new medication he  takes for pukmonary fibrosis.  He will follow up with his Dr about this.   Expected Outcomes ViMckinleyill continue to come to exercise and education classes for risk factor modification.  We continue to monitor for progression. Taking his respiratory medications correctly will help reduces SOB and exacerbations.  Continued cardio and strength training should start to help Mr. BrVeronicamprove on his SOB and make  ADLs easier for him. PLB will help Mr. Conery regain his breath during exercise and episodes of SOB.  Alfonsa will maintain his attendance and see his weight stay the same or go down.  His other vitals - BP and BG will remain well controlled.   [place] Name 01/05/16 1515             Core Components/Risk Factors/Patient Goals Review   Personal Goals Review Increase Strength and Stamina;Increase knowledge of respiratory medications and ability to use respiratory devices properly.;Develop more efficient breathing techniques such as purse lipped breathing and diaphragmatic breathing and practicing self-pacing with activity.       Review Mr Delellis has a good understanding of his MDI's Advair and Proventil. He is taking OFEV for his Pulmonary Fibrosis and has tolerated the medicine well. Mr Bruni uses PLB with his exercise goals and activities at home. His mid 60md improved 1032f- minimal importance difference for IPF is [id number]         Core Components/Risk Factors/Patient Goals at Discharge (Final Review):      Goals and Risk Factor Review - 01/05/16 1515      Core Components/Risk Factors/Patient Goals Review   Personal Goals Review Increase Strength and Stamina;Increase knowledge of respiratory medications and ability to use respiratory devices properly.;Develop more efficient breathing techniques such as purse lipped breathing and diaphragmatic breathing and practicing self-pacing with activity.   Review Mr BroOnoratos a good understanding of his MDI's Advair and Proventil. He is taking  OFEV for his Pulmonary Fibrosis and has tolerated the medicine well. Mr BroShuklaes PLB with his exercise goals and activities at home. His mid 6mw75mmproved 105ft21finimal importance difference for IPF is 78.72-147ft.53f  ITP Comments:     ITP Comments    Row Name 12/08/15 1313           ITP Comments VincenGaelogressing well with exercise.          Comments: 30 Day Review

## 2016-01-12 ENCOUNTER — Encounter: Payer: Medicare Other | Attending: Pulmonary Disease

## 2016-01-12 DIAGNOSIS — J849 Interstitial pulmonary disease, unspecified: Secondary | ICD-10-CM | POA: Insufficient documentation

## 2016-01-12 DIAGNOSIS — J4530 Mild persistent asthma, uncomplicated: Secondary | ICD-10-CM | POA: Diagnosis not present

## 2016-01-12 DIAGNOSIS — J8410 Pulmonary fibrosis, unspecified: Secondary | ICD-10-CM

## 2016-01-12 NOTE — Progress Notes (Signed)
Daily Session Note  Patient Details  Name: Jeremiah Gibson MRN: [id number] Date of Birth: Nov 29, 1935 Referring Provider:   Flowsheet Row Pulmonary Rehab from 11/23/2015 in [facility] Cardiac and Pulmonary Rehab  Referring Provider  [name]      Encounter Date: 01/12/2016  Check In:     Session Check In - 01/12/16 1044      Check-In   Location [facility]-Cardiac & Pulmonary Rehab   Staff Present Carson Myrtle, BS, RRT, Respiratory Lennie Hummer, MA, ACSM RCEP, Exercise Physiologist;Sherika Kubicki Oletta Darter, BA, ACSM CEP, Exercise Physiologist   Supervising physician immediately available to respond to emergencies [facility] immediately available ER MD   Physician(s) Alfred Levins and Reita Cliche    Medication changes reported     No   Fall or balance concerns reported    No   Warm-up and Cool-down Performed as group-led Location manager Performed Yes   VAD Patient? No     Pain Assessment   Currently in Pain? No/denies   Multiple Pain Sites No         Goals Met:  Proper associated with RPD/PD & O2 Sat Independence with exercise equipment Exercise tolerated well Strength training completed today  Goals Unmet:  Not Applicable  Comments: Pt able to follow exercise prescription today without complaint.  Will continue to monitor for progression.    Dr. Emily Filbert is Medical Director for [place] and [facility].

## 2016-01-14 ENCOUNTER — Encounter: Payer: Self-pay | Admitting: Pulmonary Disease

## 2016-01-14 ENCOUNTER — Ambulatory Visit (INDEPENDENT_AMBULATORY_CARE_PROVIDER_SITE_OTHER): Payer: Medicare Other | Admitting: Pulmonary Disease

## 2016-01-14 DIAGNOSIS — J849 Interstitial pulmonary disease, unspecified: Secondary | ICD-10-CM | POA: Diagnosis not present

## 2016-01-14 DIAGNOSIS — J4530 Mild persistent asthma, uncomplicated: Secondary | ICD-10-CM | POA: Diagnosis not present

## 2016-01-14 MED ORDER — FLUTICASONE FUROATE-VILANTEROL 100-25 MCG/INH IN AEPB: 1.0000 | INHALATION_SPRAY | Freq: Every day | RESPIRATORY_TRACT | 0 refills | Status: DC

## 2016-01-14 NOTE — Assessment & Plan Note (Signed)
Check liver tests in January and call me to notify  Fibrosis appears stable Okay to take Delsym 5 mL twice daily as needed for cough

## 2016-01-14 NOTE — Progress Notes (Signed)
   Subjective:    Patient ID: Jeremiah Gibson, male    DOB: 08/05/1935, 80 y.o.   MRN: [id number]  HPI  [age] y.o remote ex smoker for FU of cough variant asthma &ILD  [place]. He was evaluated by Pulmonology in [place] in 2008 for cough &wheezing &diagnosed with asthma. Review of his spirometry records essentially show mild restriction with FEV1 in the 72% range and normal FEV1 ratio. However spirometry from 08/2006 does show significant bronchodilator response which could be consistent with some reversible disease. On this basis, he was maintained on a regimen of Advair 250/50 twice a day and albuterol as needed for rescue . Allergy testing was negative.  He also has diabetes hyperlipidemia and hypertension.  He smoked from his teenage years upto age 80. He worked in [employer] and was [place] [employer].   01/14/2016  Chief Complaint  Patient presents with  . Follow-up    Pt. states his breathing is doing better Pt.still coughing, Pt. states he wheezes sometime in the am. Pt. states he is doing well at pulm. therapy. Pt. denies chest pain   He has been participating in pulmonary rehabilitation with good results LFTs were normal 12/2015 He is tolerating ofev well-no GI side effects Breathing is stable, dry cough persists His sugars have remained high and HbA1c is high and he wonders if ofev could be causing this  He was empirically treated for upper airway cough &GERD With 60% improvement  He wonders if Advair has stopped working and complains about cost  Spirometry 12/2015 showed stable FVC    Significant tests/ events  2013 HRCT >>Mild subpleural reticulation/fibrosis, nonspecific. No definite honeycombing . Mild bilateral lower lobe bronchiectasis   HRCT 07/2015 >> UIP pattern, worse  RAST -neg  Short course of prednisone - helped  Trial of dexilant - seemed to be better than omeprazole , but  he prefers generics.  PFTs 09/2011 - no airway obstruction, nml lung volumes, DLCO 64%   [facility] - labs ok, CXR clear, spirometry -no obstruction PFTs 11/2012 -slight drop in FVC from 3.8 to 3.5, DLCO increased to 17.9 (57%), TLC dropped to 4.95(76%)  PFT 07/2015 FVC 86%, TLC 67%, DLCO 57% (stable)   Review of Systems neg for any significant sore throat, dysphagia, itching, sneezing, nasal congestion or excess/ purulent secretions, fever, chills, sweats, unintended wt loss, pleuritic or exertional cp, hempoptysis, orthopnea pnd or change in chronic leg swelling. Also denies presyncope, palpitations, heartburn, abdominal pain, nausea, vomiting, diarrhea or change in bowel or urinary habits, dysuria,hematuria, rash, arthralgias, visual complaints, headache, numbness weakness or ataxia.     Objective:   Physical Exam  Gen. Pleasant, well-nourished, in no distress ENT - no lesions, no post nasal drip Neck: No JVD, no thyromegaly, no carotid bruits Lungs: no use of accessory muscles, no dullness to percussion,bibasal rales, no rhonchi  Cardiovascular: Rhythm regular, heart sounds  normal, no murmurs or gallops, no peripheral edema Musculoskeletal: No deformities, no cyanosis or clubbing        Assessment & Plan:

## 2016-01-14 NOTE — Addendum Note (Signed)
Addended by: Garfield CorneaMABRY, JASMINE L on: 01/14/2016 03:23 PM   Modules accepted: Orders

## 2016-01-14 NOTE — Patient Instructions (Signed)
  Check liver tests in January and call me to notify  Fibrosis appears stable Okay to take Delsym 5 mL twice daily as needed for cough

## 2016-01-14 NOTE — Assessment & Plan Note (Signed)
Continue Advair twice daily for asthma Ventolin as needed only

## 2016-01-14 NOTE — Addendum Note (Signed)
Addended by: Garfield CorneaMABRY, Bambie Pizzolato L on: 01/14/2016 04:31 PM   Modules accepted: Orders

## 2016-01-17 DIAGNOSIS — J8410 Pulmonary fibrosis, unspecified: Secondary | ICD-10-CM

## 2016-01-17 DIAGNOSIS — J849 Interstitial pulmonary disease, unspecified: Secondary | ICD-10-CM | POA: Diagnosis not present

## 2016-01-17 NOTE — Progress Notes (Signed)
Daily Session Note  Patient Details  Name: Jeremiah Gibson MRN: [id number] Date of Birth: 03/08/1936 Referring Provider:   Flowsheet Row Pulmonary Rehab from 11/23/2015 in [facility] Cardiac and Pulmonary Rehab  Referring Provider  [name]      Encounter Date: 01/17/2016  Check In:     Session Check In - 01/17/16 1303      Check-In   Location [facility]-Cardiac & Pulmonary Rehab   Staff Present Carson Myrtle, BS, RRT, Respiratory Therapist;Kelly Amedeo Plenty, BS, ACSM CEP, Exercise Physiologist;Reggie Bise Oletta Darter, BA, ACSM CEP, Exercise Physiologist   Supervising physician immediately available to respond to emergencies [facility] immediately available ER MD   Physician(s) Jimmye Norman and Corky Downs   Medication changes reported     No   Fall or balance concerns reported    No   Warm-up and Cool-down Performed as group-led instruction   VAD Patient? No     Pain Assessment   Currently in Pain? No/denies   Multiple Pain Sites No         Goals Met:  Proper associated with RPD/PD & O2 Sat Independence with exercise equipment Exercise tolerated well Strength training completed today  Goals Unmet:  Not Applicable  Comments: Pt able to follow exercise prescription today without complaint.  Will continue to monitor for progression.    Dr. Emily Filbert is Medical Director for [place] and [facility].

## 2016-01-19 ENCOUNTER — Encounter: Payer: Medicare Other | Admitting: *Deleted

## 2016-01-19 DIAGNOSIS — J8410 Pulmonary fibrosis, unspecified: Secondary | ICD-10-CM

## 2016-01-19 DIAGNOSIS — J849 Interstitial pulmonary disease, unspecified: Secondary | ICD-10-CM | POA: Diagnosis not present

## 2016-01-19 NOTE — Progress Notes (Addendum)
Daily Session Note  Patient Details  Name: Jeremiah Gibson MRN: [id number] Date of Birth: 03/02/1936 Referring Provider:   Flowsheet Row Pulmonary Rehab from 11/23/2015 in [facility] Cardiac and Pulmonary Rehab  Referring Provider  [name]      Encounter Date: 01/19/2016  Check In:     Session Check In - 01/19/16 1015      Check-In   Location [facility]-Cardiac & Pulmonary Rehab   Staff Present Gerlene Burdock, RN, BSN;Susanne Bice, RN, BSN, Lance Sell, BA, ACSM CEP, Exercise Physiologist   Supervising physician immediately available to respond to emergencies [facility] immediately available ER MD   Physician(s) Dr. Ciro Backer and Dr. Burlene Arnt   Medication changes reported     No   Fall or balance concerns reported    No   Warm-up and Cool-down Performed as group-led instruction   Resistance Training Performed Yes   VAD Patient? No     Pain Assessment   Currently in Pain? No/denies           Exercise Prescription Changes - 01/18/16 1400      Exercise Review   Progression Yes     Response to Exercise   Blood Pressure (Admit) 126/74   Blood Pressure (Exercise) 128/66   Blood Pressure (Exit) 144/82   Heart Rate (Admit) 82 bpm   Heart Rate (Exercise) 83 bpm   Heart Rate (Exit) 78 bpm   Oxygen Saturation (Admit) 97 %   Oxygen Saturation (Exercise) 96 %   Oxygen Saturation (Exit) 97 %   Rating of Perceived Exertion (Exercise) 17   Perceived Dyspnea (Exercise) 4   Symptoms none   Duration Progress to 45 minutes of aerobic exercise without signs/symptoms of physical distress   Intensity THRR unchanged     Progression   Progression Continue to progress workloads to maintain intensity without signs/symptoms of physical distress.   Average METs 2     Resistance Training   Training Prescription Yes   Weight 4 lbs   Reps 10-12     Interval Training   Interval Training No     Arm/Foot Ergometer   Level 4.5   Minutes 15     T5 Nustep   Level 5   Minutes 15   METs 2      Biostep-RELP   Level 6   Minutes 15   METs 2      Goals Met:  Proper associated with RPD/PD & O2 Sat Exercise tolerated well  Goals Unmet:  Not Applicable  Comments: Pt able to follow exercise prescription today without complaint.  Will continue to monitor for progression. Reviewed home exercise with pt today.  Pt plans to walk and go to [facility] with his Silver Sneakers for exercise.  Reviewed THR, pulse, RPE, sign and symptoms, and when to call 911 or MD.  Also discussed weather considerations and indoor options.  Pt voiced understanding.     Dr. Emily Filbert is Medical Director for [place] and [facility].

## 2016-01-20 DIAGNOSIS — H2511 Age-related nuclear cataract, right eye: Secondary | ICD-10-CM | POA: Diagnosis not present

## 2016-01-21 ENCOUNTER — Encounter: Payer: Medicare Other | Admitting: *Deleted

## 2016-01-21 DIAGNOSIS — J8410 Pulmonary fibrosis, unspecified: Secondary | ICD-10-CM

## 2016-01-21 DIAGNOSIS — J849 Interstitial pulmonary disease, unspecified: Secondary | ICD-10-CM | POA: Diagnosis not present

## 2016-01-21 NOTE — Progress Notes (Signed)
Daily Session Note  Patient Details  Name: Jeremiah Gibson MRN: [id number] Date of Birth: [birth year] Referring Provider:   Flowsheet Row Pulmonary Rehab from 11/23/2015 in [facility] Cardiac and Pulmonary Rehab  Referring Provider  [name]      Encounter Date: 01/21/2016  Check In:     Session Check In - 01/21/16 1142      Check-In   Location [facility]-Cardiac & Pulmonary Rehab   Staff Present Alberteen Sam, MA, ACSM RCEP, Exercise Physiologist;Carroll Enterkin, RN, BSN;Susanne Bice, RN, BSN, CCRP;Other   Supervising physician immediately available to respond to emergencies [facility] immediately available ER MD   Physician(s) Dr. Archie Balboa and Dr. Clearnce Hasten   Medication changes reported     No   Fall or balance concerns reported    No   Warm-up and Cool-down Performed as group-led instruction   Resistance Training Performed Yes   VAD Patient? No     Pain Assessment   Currently in Pain? No/denies   Multiple Pain Sites No         Goals Met:  Proper associated with RPD/PD & O2 Sat Independence with exercise equipment Exercise tolerated well Strength training completed today  Goals Unmet:  Not Applicable  Comments: Pt able to follow exercise prescription today without complaint.  Will continue to monitor for progression.    Dr. Emily Filbert is Medical Director for [place] and [facility].

## 2016-01-24 ENCOUNTER — Encounter: Payer: Medicare Other | Admitting: *Deleted

## 2016-01-24 DIAGNOSIS — J8410 Pulmonary fibrosis, unspecified: Secondary | ICD-10-CM

## 2016-01-24 DIAGNOSIS — J849 Interstitial pulmonary disease, unspecified: Secondary | ICD-10-CM | POA: Diagnosis not present

## 2016-01-24 NOTE — Progress Notes (Signed)
Daily Session Note  Patient Details  Name: Jeremiah Gibson MRN: [id number] Date of Birth: Apr 07, 1935 Referring Provider:   Flowsheet Row Pulmonary Rehab from 11/23/2015 in [facility] Cardiac and Pulmonary Rehab  Referring Provider  [name]      Encounter Date: 01/24/2016  Check In:     Session Check In - 01/24/16 1142      Check-In   Location [facility]-Cardiac & Pulmonary Rehab   Staff Present Gerlene Burdock, RN, Moises Blood, BS, ACSM CEP, Exercise Physiologist;Amanda Oletta Darter, [place], ACSM CEP, Exercise Physiologist   Supervising physician immediately available to respond to emergencies [facility] immediately available ER MD   Physician(s) Dr. Cinda Quest and Dr. Joni Fears   Medication changes reported     No   Fall or balance concerns reported    No   Warm-up and Cool-down Performed as group-led instruction   Resistance Training Performed Yes   VAD Patient? No     Pain Assessment   Currently in Pain? No/denies         Goals Met:  Proper associated with RPD/PD & O2 Sat Exercise tolerated well  Goals Unmet:  Not Applicable  Comments:     Dr. Emily Filbert is Medical Director for [place] and [facility].

## 2016-01-26 ENCOUNTER — Encounter: Payer: Medicare Other | Admitting: *Deleted

## 2016-01-26 DIAGNOSIS — J8410 Pulmonary fibrosis, unspecified: Secondary | ICD-10-CM

## 2016-01-26 DIAGNOSIS — J849 Interstitial pulmonary disease, unspecified: Secondary | ICD-10-CM | POA: Diagnosis not present

## 2016-01-26 NOTE — Progress Notes (Signed)
Daily Session Note  Patient Details  Name: Jeremiah Gibson MRN: [id number] Date of Birth: 08-30-1935 Referring Provider:   April Manson Pulmonary Rehab from 11/23/2015 in [facility] Cardiac and Pulmonary Rehab  Referring Provider  [name]      Encounter Date: 01/26/2016  Check In:     Session Check In - 01/26/16 1016      Check-In   Location [facility]-Cardiac & Pulmonary Rehab   Staff Present Alberteen Sam, MA, ACSM RCEP, Exercise Physiologist;Laureen Owens Shark, BS, RRT, Respiratory Dareen Piano, BA, ACSM CEP, Exercise Physiologist   Supervising physician immediately available to respond to emergencies [facility] immediately available ER MD   Physician(s) Drs. Alfred Levins and [place]   Medication changes reported     No   Fall or balance concerns reported    No   Warm-up and Cool-down Performed as group-led Location manager Performed Yes   VAD Patient? No     Pain Assessment   Currently in Pain? No/denies   Multiple Pain Sites No         Goals Met:  Proper associated with RPD/PD & O2 Sat Independence with exercise equipment Using PLB without cueing & demonstrates good technique Exercise tolerated well Strength training completed today  Goals Unmet:  Not Applicable  Comments: Pt able to follow exercise prescription today without complaint.  Will continue to monitor for progression.    Dr. Emily Filbert is Medical Director for [place] and [facility].

## 2016-01-27 DIAGNOSIS — H2511 Age-related nuclear cataract, right eye: Secondary | ICD-10-CM | POA: Diagnosis not present

## 2016-01-28 ENCOUNTER — Encounter: Payer: Medicare Other | Admitting: *Deleted

## 2016-01-28 DIAGNOSIS — J8410 Pulmonary fibrosis, unspecified: Secondary | ICD-10-CM

## 2016-01-28 DIAGNOSIS — J849 Interstitial pulmonary disease, unspecified: Secondary | ICD-10-CM | POA: Diagnosis not present

## 2016-01-28 NOTE — Progress Notes (Signed)
Daily Session Note  Patient Details  Name: Jeremiah Gibson MRN: [id number] Date of Birth: 1935-06-13 Referring Provider:   April Manson Pulmonary Rehab from 11/23/2015 in [facility] Cardiac and Pulmonary Rehab  Referring Provider  [name]      Encounter Date: 01/28/2016  Check In:     Session Check In - 01/28/16 1220      Check-In   Location [facility]-Cardiac & Pulmonary Rehab   Staff Present Nyoka Cowden, RN, BSN, Willette Pa, MA, ACSM RCEP, Exercise Physiologist;Amanda Oletta Darter, BA, ACSM CEP, Exercise Physiologist;Other   Supervising physician immediately available to respond to emergencies [facility] immediately available ER MD   Physician(s) Drs. Jimmye Norman and Qual[place]   Medication changes reported     No   Fall or balance concerns reported    No   Warm-up and Cool-down Performed as group-led Location manager Performed Yes   VAD Patient? No     Pain Assessment   Currently in Pain? No/denies   Multiple Pain Sites No         Goals Met:  Proper associated with RPD/PD & O2 Sat Independence with exercise equipment Exercise tolerated well Strength training completed today  Goals Unmet:  Not Applicable  Comments: Pt able to follow exercise prescription today without complaint.  Will continue to monitor for progression.    Dr. Emily Filbert is Medical Director for [place] and [facility].

## 2016-01-31 ENCOUNTER — Encounter: Payer: Self-pay | Admitting: *Deleted

## 2016-01-31 DIAGNOSIS — J8410 Pulmonary fibrosis, unspecified: Secondary | ICD-10-CM

## 2016-01-31 DIAGNOSIS — J849 Interstitial pulmonary disease, unspecified: Secondary | ICD-10-CM | POA: Diagnosis not present

## 2016-01-31 NOTE — Pre-Procedure Instructions (Signed)
VERIFIED WITH CINDI RN AT [facility] 01/27/16 AND DOS 02/08/16

## 2016-01-31 NOTE — Progress Notes (Signed)
Daily Session Note  Patient Details  Name: Jeremiah Gibson MRN: [id number] Date of Birth: 1935/11/29 Referring Provider:   Flowsheet Row Pulmonary Rehab from 11/23/2015 in [facility] Cardiac and Pul[facility] Rehab  Referring Provider  [name]      Encounter Date: 01/31/2016  Check In:     Session Check In - 01/31/16 1303      Check-In   Location [facility]-Cardiac & Pulmonary Rehab   Staff Present Carson Myrtle, BS, RRT, Respiratory Therapist;Kelly Amedeo Plenty, BS, ACSM CEP, Exercise Physiologist;Valeta Paz Oletta Darter, BA, ACSM CEP, Exercise Physiologist   Supervising physician immediately available to respond to emergencies [facility] immediately available ER MD   Physician(s) Cinda Quest and Kinner   Medication changes reported     No   Fall or balance concerns reported    No   Warm-up and Cool-down Performed as group-led instruction   Resistance Training Performed Yes   VAD Patient? No     Pain Assessment   Currently in Pain? No/denies         Goals Met:  Proper associated with RPD/PD & O2 Sat Independence with exercise equipment Exercise tolerated well Strength training completed today  Goals Unmet:  Not Applicable  Comments: Pt able to follow exercise prescription today without complaint.  Will continue to monitor for progression.    Dr. Emily Filbert is Medical Director for [place] and [facility].

## 2016-02-02 VITALS — Ht 70.0 in | Wt 213.0 lb

## 2016-02-02 DIAGNOSIS — J849 Interstitial pulmonary disease, unspecified: Secondary | ICD-10-CM | POA: Diagnosis not present

## 2016-02-02 DIAGNOSIS — J8410 Pulmonary fibrosis, unspecified: Secondary | ICD-10-CM

## 2016-02-02 NOTE — Progress Notes (Signed)
Daily Session Note  Patient Details  Name: Jeremiah Gibson MRN: [id number] Date of Birth: 20-Dec-1935 Referring Provider:   Flowsheet Row Pulmonary Rehab from 11/23/2015 in [facility] Cardiac and Pulmonary Rehab  Referring Provider  [name]      Encounter Date: 02/02/2016  Check In:     Session Check In - 02/02/16 1047      Check-In   Location [facility]-Cardiac & Pulmonary Rehab   Staff Present Carson Myrtle, BS, RRT, Respiratory Lennie Hummer, MA, ACSM RCEP, Exercise Physiologist;Amanda Oletta Darter, BA, ACSM CEP, Exercise Physiologist;Other   Physician(s) Marcelene Butte and Alfred Levins   Medication changes reported     No   Fall or balance concerns reported    No   Warm-up and Cool-down Performed as group-led Location manager Performed Yes   VAD Patient? No     Pain Assessment   Currently in Pain? No/denies           Exercise Prescription Changes - 02/01/16 1500      Exercise Review   Progression Yes     Response to Exercise   Blood Pressure (Admit) 116/70   Blood Pressure (Exercise) 148/68   Blood Pressure (Exit) 124/70   Heart Rate (Admit) 90 bpm   Heart Rate (Exercise) 92 bpm   Heart Rate (Exit) 84 bpm   Oxygen Saturation (Admit) 96 %   Oxygen Saturation (Exercise) 96 %   Oxygen Saturation (Exit) 97 %   Rating of Perceived Exertion (Exercise) 16   Perceived Dyspnea (Exercise) 4   Symptoms none   Comments Home Exercise Guidelines given 01/19/16   Duration Progress to 45 minutes of aerobic exercise without signs/symptoms of physical distress   Intensity THRR unchanged     Progression   Progression Continue to progress workloads to maintain intensity without signs/symptoms of physical distress.   Average METs 2.1     Resistance Training   Training Prescription Yes   Weight 4 lbs   Reps 10-12     Interval Training   Interval Training No     Arm/Foot Ergometer   Level 4.5   Watts --  40 rpm   Minutes 15     T5 Nustep   Level 5   Minutes 15   METs 2.2     Biostep-RELP   Level 6   Minutes 15   METs 2     Home Exercise Plan   Plans to continue exercise at [employer] (comment)  walking and [employer] ([employer]neakers)   Frequency Add 2 additional days to program exercise sessions.      Goals Met:  Proper associated with RPD/PD & O2 Sat Independence with exercise equipment Exercise tolerated well Strength training completed today  Goals Unmet:  Not Applicable  Comments:      [place] Name 11/23/15 1236 01/05/16 1241 02/02/16 1049     6 Minute Walk   Phase  - Mid Program Discharge   Distance 1320 feet 1425 feet 1476 feet   Distance % Change  - 7.5 % 11.8 %   Walk Time 6 minutes 6 minutes 6 minutes   # of Rest Breaks  - 0 0   MPH 2.5 2.69 2.79   METS 2.49 2.82 2.88   RPE 13 16 16    Perceived Dyspnea  3 4 4    VO2 Peak 8.7 9.8 10.1   Symptoms No No No   Resting HR 85 bpm 88 bpm 90 bpm   Resting BP 124/76  126/74 124/64   Max Ex. HR 105 bpm 113 bpm 126 bpm   Max Ex. BP 144/64 152/56 132/74   2 Minute Post BP  -  - 134/70     Interval HR   Baseline HR 85 88 90   1 Minute HR 92 104 102   2 Minute HR 93 107  -   3 Minute HR 102  - 124   4 Minute HR 103 113  -   5 Minute HR 103 112 126   6 Minute HR 105 112 113   2 Minute Post HR 96 97  -   Interval Heart Rate? Yes Yes Yes     Interval Oxygen   Interval Oxygen? Yes Yes  -   Baseline Oxygen Saturation % 97 % 94 % 94 %   1 Minute Oxygen Saturation % 93 % 94 % 94 %   2 Minute Oxygen Saturation % 88 % 94 % 94 %   3 Minute Oxygen Saturation % 89 %  - 94 %   4 Minute Oxygen Saturation % 90 % 94 % 94 %   5 Minute Oxygen Saturation % 91 % 94 % 94 %   6 Minute Oxygen Saturation % 93 % 94 % 94 %   2 Minute Post Oxygen Saturation % 96 % 94 % 96 %        Dr. Emily Filbert is Medical Director for [place] and [facility].

## 2016-02-07 ENCOUNTER — Encounter: Payer: Self-pay | Admitting: Respiratory Therapy

## 2016-02-07 DIAGNOSIS — J8410 Pulmonary fibrosis, unspecified: Secondary | ICD-10-CM

## 2016-02-07 NOTE — Progress Notes (Signed)
Pulmonary Individual Treatment Plan  Patient Details  Name: Edy Belt MRN: [id number] Date of Birth: Aug 20, 1935 Referring Provider:   Flowsheet Row Pulmonary Rehab from 11/23/2015 in [facility] Cardiac and Pulmonary Rehab  Referring Provider  [name]      Initial Encounter Date:  Flowsheet Row Pulmonary Rehab from 11/23/2015 in [facility] Cardiac and Pulmonary Rehab  Date  11/23/15  Referring Provider  [name]      Visit Diagnosis: Pulmonary fibrosis ([place])  Patient's Home Medications on Admission:  Current Outpatient Prescriptions:    albuterol (VENTOLIN HFA) 108 (90 Base) MCG/ACT inhaler, Inhale 2 puffs into the lungs every 4 (four) hours as needed for wheezing or shortness of breath., Disp: 3 Inhaler, Rfl: 3   allopurinol (ZYLOPRIM) 100 MG tablet, Take 2 tablets (200 mg total) by mouth 2 (two) times daily., Disp: 360 tablet, Rfl: 3   amLODipine (NORVASC) 5 MG tablet, Take 1 tablet by mouth  every day for blood  pressure, Disp: 90 tablet, Rfl: 3   aspirin 81 MG tablet, Take 81 mg by mouth every evening. , Disp: , Rfl:    finasteride (PROSCAR) 5 MG tablet, Take 1 tablet by mouth  every day, Disp: 90 tablet, Rfl: 3   fish oil-omega-3 fatty acids 1000 MG capsule, Take 1 g by mouth 2 (two) times daily. , Disp: , Rfl:    Fluticasone-Salmeterol (ADVAIR DISKUS) 250-50 MCG/DOSE AEPB, Inhale 1 puff into the lungs 2 (two) times daily., Disp: 14 each, Rfl: 0   glipiZIDE (GLUCOTROL XL) 10 MG 24 hr tablet, Take 1 tablet by mouth  daily with food for  diabetes; take with largest meal of the day, Disp: 90 tablet, Rfl: 3   losartan (COZAAR) 100 MG tablet, Take 1 tablet by mouth  every day for blood  pressure, Disp: 90 tablet, Rfl: 3   Magnesium 400 MG TABS, Take 1 tablet by mouth 2 (two) times daily., Disp: , Rfl:    metFORMIN (GLUCOPHAGE) 500 MG tablet, Take 2 tablets (1,000 mg total) by mouth 2 (two) times daily with a meal., Disp: 360 tablet, Rfl: 4   MULTIPLE VITAMIN PO, MULTIVITAMINS (Oral  Tablet)  1 Every Day for 0 days  Quantity: 0.00;  Refills: 0   Ordered :06-Jan-2010  Doy Hutching ;  Started 22-Mar-2009 Active, Disp: , Rfl:    Nintedanib (OFEV) 100 MG CAPS, Take 100 mg by mouth 2 (two) times daily., Disp: , Rfl:    pantoprazole (PROTONIX) 20 MG tablet, Take 20 mg by mouth 2 (two) times daily., Disp: , Rfl:    potassium chloride SA (K-DUR,KLOR-CON) 20 MEQ tablet, Take 1 tablet by mouth  daily, Disp: 90 tablet, Rfl: 3   simvastatin (ZOCOR) 20 MG tablet, Take 1 tablet (20 mg total) by mouth daily. (Patient taking differently: Take 20 mg by mouth daily at 6 PM. ), Disp: 90 tablet, Rfl: 0   terazosin (HYTRIN) 5 MG capsule, Take 1 capsule by mouth  every day for prostate, Disp: 90 capsule, Rfl: 3   triamterene-hydrochlorothiazide (MAXZIDE-25) 37.5-25 MG tablet, Take 1 tablet by mouth  Every Day for blood  pressure, Disp: 90 tablet, Rfl: 3  Past Medical History: Past Medical History:  Diagnosis Date   Anemia    Arthritis    Asthma    Colon polyps    Cough    CHRONIC   Diabetes mellitus without complication (HCC)    Diverticulosis of colon (without mention of hemorrhage)    Dyspnea    Esophageal reflux  Gout    Impotence of organic origin    Leg cramps    Pulmonary fibrosis (HCC)    Wheezing     Tobacco Use: History  Smoking Status   Former Smoker   Packs/day: 2.00   Years: 20.00   Types: Cigarettes, Pipe   Quit date: 11/22/1999  Smokeless Tobacco   Never Used    Comment: quit cigs in 72 and pipe in 01    Labs: Recent Review Flowsheet Data    Labs for ITP Cardiac and Pulmonary Rehab Latest Ref Rng & Units 11/04/2014 02/09/2015 05/11/2015 08/10/2015 12/28/2015   Cholestrol 100 - 199 mg/dL - - 177 - 158   LDLCALC 0 - 99 mg/dL - - 99 - 91   HDL >39 mg/dL - - 42 - 39(L)   Trlycerides 0 - 149 mg/dL - - 178(H) - 139   Hemoglobin A1c 4.8 - 5.6 % 7.7 7.9 7.8 7.9 8.6(H)       ADL UCSD:     Pulmonary Assessment Scores    Row Name  11/23/15 1311 01/05/16 1512       ADL UCSD   ADL Phase Entry Mid    SOB Score total 8 32    Rest 0 0    Walk 0 1    Stairs 1 3    Bath 0 1    Dress 0 2    Shop 0 1       Pulmonary Function Assessment:     Pulmonary Function Assessment - 11/23/15 1310      Initial Spirometry Results   FVC% 86 %   FEV1% 98 %   FEV1/FVC Ratio 82   Comments Test date 07/20/15     Post Bronchodilator Spirometry Results   FVC% 81 %   FEV1% 93 %   FEV1/FVC Ratio 82     Breath   Bilateral Breath Sounds Basilar;Rales   Shortness of Breath Yes      Exercise Target Goals:    Exercise Program Goal: Individual exercise prescription set with THRR, safety & activity barriers. Participant demonstrates ability to understand and report RPE using BORG scale, to self-measure pulse accurately, and to acknowledge the importance of the exercise prescription.  Exercise Prescription Goal: Starting with aerobic activity 30 plus minutes a day, 3 days per week for initial exercise prescription. Provide home exercise prescription and guidelines that participant acknowledges understanding prior to discharge.  Activity Barriers & Risk Stratification:     Activity Barriers & Cardiac Risk Stratification - 11/23/15 1309      Activity Barriers & Cardiac Risk Stratification   Activity Barriers Shortness of Breath;Deconditioning   Cardiac Risk Stratification Moderate      6 Minute Walk:     6 Minute Walk    Row Name 11/23/15 1236 01/05/16 1241 02/02/16 1049     6 Minute Walk   Phase  -- Mid Program Discharge   Distance 1320 feet 1425 feet 1476 feet   Distance % Change  -- 7.5 % 11.8 %   Walk Time 6 minutes 6 minutes 6 minutes   # of Rest Breaks  -- 0 0   MPH 2.5 2.69 2.79   METS 2.49 2.82 2.88   RPE '13 16 16   '$ Perceived Dyspnea  '3 4 4   '$ VO2 Peak 8.7 9.8 10.1   Symptoms No No No   Resting HR 85 bpm 88 bpm 90 bpm   Resting BP 124/76 126/74 124/64   Max Ex. HR 105  bpm 113 bpm 126 bpm   Max Ex. BP  144/64 152/56 132/74   2 Minute Post BP  --  -- 134/70     Interval HR   Baseline HR 85 88 90   1 Minute HR 92 104 102   2 Minute HR 93 107  --   3 Minute HR 102  -- 124   4 Minute HR 103 113  --   5 Minute HR 103 112 126   6 Minute HR 105 112 113   2 Minute Post HR 96 97  --   Interval Heart Rate? Yes Yes Yes     Interval Oxygen   Interval Oxygen? Yes Yes  --   Baseline Oxygen Saturation % 97 % 94 % 94 %   1 Minute Oxygen Saturation % 93 % 94 % 94 %   2 Minute Oxygen Saturation % 88 % 94 % 94 %   3 Minute Oxygen Saturation % 89 %  -- 94 %   4 Minute Oxygen Saturation % 90 % 94 % 94 %   5 Minute Oxygen Saturation % 91 % 94 % 94 %   6 Minute Oxygen Saturation % 93 % 94 % 94 %   2 Minute Post Oxygen Saturation % 96 % 94 % 96 %      Initial Exercise Prescription:     Initial Exercise Prescription - 11/23/15 1200      Date of Initial Exercise RX and Referring Provider   Date 11/23/15   Referring Provider [name]     Treadmill   MPH 1.7   Grade 0.5   Minutes 15   METs 2.42     Recumbant Bike   Level 1   RPM 60   Minutes 15   METs 2.3     NuStep   Level 2   Minutes 15   METs 2     T5 Nustep   Level 1   Minutes 15   METs 2     Biostep-RELP   Level 2   Minutes 15   METs 2     Prescription Details   Frequency (times per week) 3   Duration Progress to 45 minutes of aerobic exercise without signs/symptoms of physical distress     Intensity   THRR 40-80% of Max Heartrate 107-130   Ratings of Perceived Exertion 11-13   Perceived Dyspnea 0-4     Progression   Progression Continue to progress workloads to maintain intensity without signs/symptoms of physical distress.     Resistance Training   Training Prescription Yes   Weight 2   Reps 10-15      Perform Capillary Blood Glucose checks as needed.  Exercise Prescription Changes:     Exercise Prescription Changes    Row Name 11/29/15 1400 12/08/15 1300 12/22/15 1500 01/05/16 1400 01/18/16 1400      Exercise Review   Progression  -- Yes Yes Yes Yes     Response to Exercise   Blood Pressure (Admit) 120/70 120/62 136/76 126/74 126/74   Blood Pressure (Exercise) 134/82 148/82 126/64 152/86 128/66   Blood Pressure (Exit) 138/64 134/78 116/64 126/66 144/82   Heart Rate (Admit) 76 bpm 94 bpm 95 bpm 94 bpm 82 bpm   Heart Rate (Exercise) 98 bpm 94 bpm 115 bpm 95 bpm 83 bpm   Heart Rate (Exit) 74 bpm 88 bpm 86 bpm 98 bpm 78 bpm   Oxygen Saturation (Admit) 97 % 95 % 97 % 88 %  97 %   Oxygen Saturation (Exercise) 97 % 95 % 90 % 96 % 96 %   Oxygen Saturation (Exit) 98 % 96 % 97 % 98 % 97 %   Rating of Perceived Exertion (Exercise) _0 Perceived Dyspnea (Exercise) _1 Symptoms _2    Duration Progress to 45 minutes of aerobic exercise without signs/symptoms of physical distress Progress to 45 minutes of aerobic exercise without signs/symptoms of physical distress Progress to 45 minutes of aerobic exercise without signs/symptoms of physical distress Progress to 45 minutes of aerobic exercise without signs/symptoms of physical distress Progress to 45 minutes of aerobic exercise without signs/symptoms of physical distress   Intensity _3      Progression   Progression Continue to progress workloads to maintain intensity without signs/symptoms of physical distress. Continue to progress workloads to maintain intensity without signs/symptoms of physical distress. Continue to progress workloads to maintain intensity without signs/symptoms of physical distress. Continue to progress workloads to maintain intensity without signs/symptoms of physical distress. Continue to progress workloads to maintain intensity without signs/symptoms of physical distress.   Average METs  --  -- 2.05 2.2 2     Resistance Training   Training Prescription _4    Weight _5 lbs 4 lbs 4 lbs   Reps 10-15  10-15 10-12 10-12 10-12     Interval Training   Interval Training _6      Treadmill   MPH 1  --  --  --  --   Grade 0.5  --  --  --  --   Minutes 10  5/5  --  --  --  --     Arm/Foot Ergometer   Level  -- _7 4.5   Watts  --  --  -- --  50 rpm  --   Minutes  -- _8 T5 Nustep   Level _9 80 spm 5   Minutes _10 METs  -- 2 2.1 2.3 2     Biostep-RELP   Level _11 40 spm 6   Minutes _12 METs  -- _13 Row Name 01/19/16 1500 02/01/16 1500           Exercise Review   Progression Yes Yes        Response to Exercise   Blood Pressure (Admit)  -- 116/70      Blood Pressure (Exercise)  -- 148/68      Blood Pressure (Exit)  -- 124/70      Heart Rate (Admit)  -- 90 bpm      Heart Rate (Exercise)  -- 92 bpm      Heart Rate (Exit)  -- 84 bpm      Oxygen Saturation (Admit)  -- 96 %      Oxygen Saturation (Exercise)  -- 96 %      Oxygen Saturation (Exit)  -- 97 %      Rating of Perceived Exertion (Exercise)  -- 16      Perceived Dyspnea (Exercise)  -- 4      Symptoms none none      Comments Home Exercise Guidelines given 01/19/16 Home Exercise Guidelines given  01/19/16      Duration Progress to 45 minutes of aerobic exercise without signs/symptoms of physical distress Progress to 45 minutes of aerobic exercise without signs/symptoms of physical distress      Intensity THRR unchanged THRR unchanged        Progression   Progression Continue to progress workloads to maintain intensity without signs/symptoms of physical distress. Continue to progress workloads to maintain intensity without signs/symptoms of physical distress.      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 Add 2 additional days to program exercise sessions.         Exercise Comments:     Exercise Comments    Row Name 11/29/15 1503 12/08/15 1313 12/22/15 1554 01/05/16 1450 01/18/16 1416   Exercise Comments Mr Homan did well his first day of exercise.  Exercise equipment safety, infection control, and diabetes, high and low blood sugar signs were reveiwed. Hobart is progressing well with exercise. Leviathan is making good progress in rehab.  He is now up to level 4 on T5 and BioStep.  We wll continue to monitor his progression. Alton completed his mid 6 min walk test today.  He has already impoved by 105 ft (7.5%)!!  We will continue to monitor his progress. Green continues to do well with exercise. He is already over half way through the program.  We will continue to monitor his progression.   [place] Name 01/19/16 1548 02/01/16 1541         Exercise Comments Reviewed home exercise with pt today.  Pt plans to walk and go to [facility] with his [employer] for exercise.  Reviewed THR, pulse, RPE, sign and symptoms, and when to call 911 or MD.  Also discussed weather considerations and indoor options.  Pt voiced understanding.    Kenyatta continues to do well.  We will continue to watch for progression. Mackson has continued to do well in rehab.  We will continue to monitor his progression.         Discharge Exercise Prescription (Final Exercise Prescription Changes):     Exercise Prescription Changes - 02/01/16 1500      Exercise Review   Progression Yes     Response to Exercise   Blood Pressure (Admit) 116/70   Blood  Pressure (Exercise) 148/68   Blood Pressure (Exit) 124/70   Heart Rate (Admit) 90 bpm   Heart Rate (Exercise) 92  bpm   Heart Rate (Exit) 84 bpm   Oxygen Saturation (Admit) 96 %   Oxygen Saturation (Exercise) 96 %   Oxygen Saturation (Exit) 97 %   Rating of Perceived Exertion (Exercise) 16   Perceived Dyspnea (Exercise) 4   Symptoms none   Comments Home Exercise Guidelines given 01/19/16   Duration Progress to 45 minutes of aerobic exercise without signs/symptoms of physical distress   Intensity THRR unchanged     Progression   Progression Continue to progress workloads to maintain intensity without signs/symptoms of physical distress.   Average METs 2.1     Resistance Training   Training Prescription Yes   Weight 4 lbs   Reps 10-12     Interval Training   Interval Training No     Arm/Foot Ergometer   Level 4.5   Watts --  40 rpm   Minutes 15     T5 Nustep   Level 5   Minutes 15   METs 2.2     Biostep-RELP   Level 6   Minutes 15   METs 2     Home Exercise Plan   Plans to continue exercise at [employer] (comment)  walking and [employer] ([employer])   Frequency Add 2 additional days to program exercise sessions.       Nutrition:  Target Goals: Understanding of nutrition guidelines, daily intake of sodium <1585m, cholesterol <2062m calories 30% from fat and 7% or less from saturated fats, daily to have 5 or more servings of fruits and vegetables.  Biometrics:     Pre Biometrics - 11/23/15 1234      Pre Biometrics   Height _0  (1.778 m)   Weight 214 lb 8 oz (97.3 kg)   Waist Circumference 44 inches   Hip Circumference 43 inches   Waist to Hip Ratio 1.02 %   BMI (Calculated) 30.8         Post Biometrics - 02/02/16 1048       Post  Biometrics   Height _1  (1.778 m)   Weight 213 lb (96.6 kg)   Waist Circumference 43 inches   Hip Circumference 43 inches   Waist to Hip Ratio 1 %   BMI (Calculated) 30.6      Nutrition Therapy Plan  and Nutrition Goals:   Nutrition Discharge: Rate Your Plate Scores:   Psychosocial: Target Goals: Acknowledge presence or absence of depression, maximize coping skills, provide positive support system. Participant is able to verbalize types and ability to use techniques and skills needed for reducing stress and depression.  Initial Review & Psychosocial Screening:     Initial Psych Review & Screening - 11/23/15 [email or web address]   Comments Mr BrMarcilas great support from his wife. He has accepted his pulmonary fibrosis and is very positive about managing the disease.     Barriers   Psychosocial barriers to participate in program The patient should benefit from training in stress management and relaxation.     Screening Interventions   Interventions Encouraged to exercise      Quality of Life Scores:     Quality of Life - 11/23/15 1333      Quality of Life Scores   Health/Function Pre 20.19 %   Socioeconomic Pre 20.4 %   Psych/Spiritual Pre 20.29 %   Family Pre 20 %   GLOBAL Pre 20.21 %      PHQ-9: Recent Review Flowsheet  Data    Depression screen [facility] 2/9 12/28/2015 11/23/2015   Decreased Interest 0 0   Down, Depressed, Hopeless 0 0   PHQ - 2 Score 0 0   Altered sleeping - 0   Tired, decreased energy - 0   Change in appetite - 0   Feeling bad or failure about yourself  - 0   Trouble concentrating - 0   Moving slowly or fidgety/restless - 0   Suicidal thoughts - 0   PHQ-9 Score - 0      Psychosocial Evaluation and Intervention:     Psychosocial Evaluation - 12/01/15 1131      Psychosocial Evaluation & Interventions   Interventions Encouraged to exercise with the program and follow exercise prescription   Comments Counselor met with Mr. Mckillop today for initial psychosocial evaluation.  He is a 80 year old who has been recently diagnosed with pulmonary fibrosis.  He has a strong support system with a spouse of 80 years;  friends close by & active involvement in his local church community.  Mr. Matherne has other health issues that are challenging in addition to the pulmonary fibrosis; he has diabetes; high cholesterol and high blood pressure.  He states he sleeps well and has a good appetite.  He denies a history or current symptoms of depresssion or anxiety and is typically in a positive mood.  Mr. Vanwingerden reports minimal stress in his life other than his health issues.  He has goals to breathe better while in this program and plans to continue exercising with his [employer] class upon completion in this pulmonary rehab class.        Psychosocial Re-Evaluation:     Psychosocial Re-Evaluation    [place] Name 01/26/16 1147             Psychosocial Re-Evaluation   Comments Counselor follow up with Mr. B today reporting he is feeling stronger and his legs are especially stronger since coming into this program.  He is sleeping well and continues to have a good appetite.  He states his breathing is "about the same."  Mr. B reports the stress in his life is manageable and he continues to have a positive attitude and enjoys coming to these classes.           Education: Education Goals: Education classes will be provided on a weekly basis, covering required topics. Participant will state understanding/return demonstration of topics presented.  Learning Barriers/Preferences:     Learning Barriers/Preferences - 11/23/15 1310      Learning Barriers/Preferences   Learning Barriers None   Learning Preferences Group Instruction;Individual Instruction;Pictoral;Skilled Demonstration;Verbal Instruction;Video;Written Material      Education Topics: Initial Evaluation Education: - Verbal, written and demonstration of respiratory meds, RPE/PD scales, oximetry and breathing techniques. Instruction on use of nebulizers and MDIs: cleaning and proper use, rinsing mouth with steroid doses and importance of monitoring MDI  activations. Flowsheet Row Pulmonary Rehab from 01/31/2016 in [facility] Cardiac and Pulmonary Rehab  Date  11/23/15  Educator  LB  Instruction Review Code  2- meets goals/outcomes      General Nutrition Guidelines/Fats and Fiber: -Group instruction provided by verbal, written material, models and posters to present the general guidelines for heart healthy nutrition. Gives an explanation and review of dietary fats and fiber. Flowsheet Row Pulmonary Rehab from 01/31/2016 in [facility] Cardiac and Pulmonary Rehab  Date  01/31/16  Educator  CR  Instruction Review Code  2- meets goals/outcomes  Controlling Sodium/Reading Food Labels: -Group verbal and written material supporting the discussion of sodium use in heart healthy nutrition. Review and explanation with models, verbal and written materials for utilization of the food label.   Exercise Physiology & Risk Factors: - Group verbal and written instruction with models to review the exercise physiology of the cardiovascular system and associated critical values. Details cardiovascular disease risk factors and the goals associated with each risk factor. Flowsheet Row Pulmonary Rehab from 01/31/2016 in [facility] Cardiac and Pulmonary Rehab  Date  01/26/16  Educator  AS  Instruction Review Code  2- meets goals/outcomes      Aerobic Exercise & Resistance Training: - Gives group verbal and written discussion on the health impact of inactivity. On the components of aerobic and resistive training programs and the benefits of this training and how to safely progress through these programs. Flowsheet Row Pulmonary Rehab from 01/31/2016 in [facility] Cardiac and Pulmonary Rehab  Date  12/01/15  Educator  Nada Maclachlan  Instruction Review Code  2- meets goals/outcomes      Flexibility, Balance, General Exercise Guidelines: - Provides group verbal and written instruction on the benefits of flexibility and balance training programs. Provides general exercise  guidelines with specific guidelines to those with heart or lung disease. Demonstration and skill practice provided.   Stress Management: - Provides group verbal and written instruction about the health risks of elevated stress, cause of high stress, and healthy ways to reduce stress.   Depression: - Provides group verbal and written instruction on the correlation between heart/lung disease and depressed mood, treatment options, and the stigmas associated with seeking treatment.   Exercise & Equipment Safety: - Individual verbal instruction and demonstration of equipment use and safety with use of the equipment. Flowsheet Row Pulmonary Rehab from 01/31/2016 in [facility] Cardiac and Pulmonary Rehab  Date  11/29/15  Educator  AS  Instruction Review Code  2- meets goals/outcomes      Infection Prevention: - Provides verbal and written material to individual with discussion of infection control including proper hand washing and proper equipment cleaning during exercise session. Flowsheet Row Pulmonary Rehab from 01/31/2016 in [facility] Cardiac and Pulmonary Rehab  Date  11/29/15  Educator  AS  Instruction Review Code  2- meets goals/outcomes      Falls Prevention: - Provides verbal and written material to individual with discussion of falls prevention and safety. Flowsheet Row Pulmonary Rehab from 01/31/2016 in [facility] Cardiac and Pulmonary Rehab  Date  11/23/15  Educator  LB  Instruction Review Code  2- meets goals/outcomes      Diabetes: - Individual verbal and written instruction to review signs/symptoms of diabetes, desired ranges of glucose level fasting, after meals and with exercise. Advice that pre and post exercise glucose checks will be done for 3 sessions at entry of program. Flowsheet Row Pulmonary Rehab from 01/31/2016 in [facility] Cardiac and Pulmonary Rehab  Date  11/29/15  Educator  AS  Instruction Review Code  2- meets goals/outcomes      Chronic Lung Diseases: - Group  verbal and written instruction to review new updates, new respiratory medications, new advancements in procedures and treatments. Provide informative websites and "800" numbers of self-education. Flowsheet Row Pulmonary Rehab from 01/31/2016 in [facility] Cardiac and Pulmonary Rehab  Date  12/27/15  Educator  LB  Instruction Review Code  2- meets goals/outcomes      Lung Procedures: - Group verbal and written instruction to describe testing methods done to diagnose lung disease. Review  the outcome of test results. Describe the treatment choices: Pulmonary Function Tests, ABGs and oximetry.   Energy Conservation: - Provide group verbal and written instruction for methods to conserve energy, plan and organize activities. Instruct on pacing techniques, use of adaptive equipment and posture/positioning to relieve shortness of breath.   Triggers: - Group verbal and written instruction to review types of environmental controls: home humidity, furnaces, filters, dust mite/pet prevention, HEPA vacuums. To discuss weather changes, air quality and the benefits of nasal washing.   Exacerbations: - Group verbal and written instruction to provide: warning signs, infection symptoms, calling MD promptly, preventive modes, and value of vaccinations. Review: effective airway clearance, coughing and/or vibration techniques. Create an Sports administrator.   Oxygen: - Individual and group verbal and written instruction on oxygen therapy. Includes supplement oxygen, available portable oxygen systems, continuous and intermittent flow rates, oxygen safety, concentrators, and Medicare reimbursement for oxygen.   Respiratory Medications: - Group verbal and written instruction to review medications for lung disease. Drug class, frequency, complications, importance of spacers, rinsing mouth after steroid MDI's, and proper cleaning methods for nebulizers. Flowsheet Row Pulmonary Rehab from 01/31/2016 in [facility] Cardiac and  Pulmonary Rehab  Date  11/23/15  Educator  LB  Instruction Review Code  2- meets goals/outcomes      AED/CPR: - Group verbal and written instruction with the use of models to demonstrate the basic use of the AED with the basic ABC's of resuscitation. Flowsheet Row Pulmonary Rehab from 01/31/2016 in [facility] Cardiac and Pulmonary Rehab  Date  01/21/16  Educator  CE  Instruction Review Code  2- meets goals/outcomes      Breathing Retraining: - Provides individuals verbal and written instruction on purpose, frequency, and proper technique of diaphragmatic breathing and pursed-lipped breathing. Applies individual practice skills. Flowsheet Row Pulmonary Rehab from 01/31/2016 in [facility] Cardiac and Pulmonary Rehab  Date  11/23/15  Educator  LB  Instruction Review Code  2- meets goals/outcomes      Anatomy and Physiology of the Lungs: - Group verbal and written instruction with the use of models to provide basic lung anatomy and physiology related to function, structure and complications of lung disease.   Heart Failure: - Group verbal and written instruction on the basics of heart failure: signs/symptoms, treatments, explanation of ejection fraction, enlarged heart and cardiomyopathy.   Sleep Apnea: - Individual verbal and written instruction to review Obstructive Sleep Apnea. Review of risk factors, methods for diagnosing and types of masks and machines for OSA.   Anxiety: - Provides group, verbal and written instruction on the correlation between heart/lung disease and anxiety, treatment options, and management of anxiety. Flowsheet Row Pulmonary Rehab from 01/31/2016 in [facility] Cardiac and Pulmonary Rehab  Date  12/15/15  Educator  [facility]  Instruction Review Code  2- Meets goals/outcomes      Relaxation: - Provides group, verbal and written instruction about the benefits of relaxation for patients with heart/lung disease. Also provides patients with examples of relaxation  techniques. Flowsheet Row Pulmonary Rehab from 01/31/2016 in [facility] Cardiac and Pulmonary Rehab  Date  01/12/16  Educator  [facility]  Instruction Review Code  2- Meets goals/outcomes      Knowledge Questionnaire Score:     Knowledge Questionnaire Score - 11/23/15 1310      Knowledge Questionnaire Score   Pre Score 8/10       Core Components/Risk Factors/Patient Goals at Admission:     Personal Goals and Risk Factors at Admission - 11/23/15 1319  Core Components/Risk Factors/Patient Goals on Admission    Weight Management Yes   Intervention Weight Management: Develop a combined nutrition and exercise program designed to reach desired caloric intake, while maintaining appropriate intake of nutrient and fiber, sodium and fats, and appropriate energy expenditure required for the weight goal.  Mr Dobrowski prefers not to meet with the dietitian. His wife is a good , healthy cook. He does like bread, but not sweets.   Admit Weight 214 lb 6.4 oz (97.3 kg)   Goal Weight: Short Term 209 lb (94.8 kg)   Goal Weight: Long Term 204 lb (92.5 kg)   Expected Outcomes Understanding of distribution of calorie intake throughout the day with the consumption of 4-5 meals/snacks   Sedentary Yes   Intervention Provide advice, education, support and counseling about physical activity/exercise needs.;Develop an individualized exercise prescription for aerobic and resistive training based on initial evaluation findings, risk stratification, comorbidities and participant's personal goals.  He does the [employer] class twice a week..   Expected Outcomes Achievement of increased cardiorespiratory fitness and enhanced flexibility, muscular endurance and strength shown through measurements of functional capacity and personal statement of participant.   Increase Strength and Stamina Yes   Intervention Provide advice, education, support and counseling about physical activity/exercise needs.;Develop an individualized  exercise prescription for aerobic and resistive training based on initial evaluation findings, risk stratification, comorbidities and participant's personal goals.   Expected Outcomes Achievement of increased cardiorespiratory fitness and enhanced flexibility, muscular endurance and strength shown through measurements of functional capacity and personal statement of participant.   Improve shortness of breath with ADL's Yes   Intervention Provide education, individualized exercise plan and daily activity instruction to help decrease symptoms of SOB with activities of daily living.   Expected Outcomes Short Term: Achieves a reduction of symptoms when performing activities of daily living.   Develop more efficient breathing techniques such as purse lipped breathing and diaphragmatic breathing; and practicing self-pacing with activity Yes   Intervention Provide education, demonstration and support about specific breathing techniuqes utilized for more efficient breathing. Include techniques such as pursed lipped breathing, diaphragmatic breathing and self-pacing activity.   Expected Outcomes Short Term: Participant will be able to demonstrate and use breathing techniques as needed throughout daily activities.   Increase knowledge of respiratory medications and ability to use respiratory devices properly  Yes   Intervention Provide education and demonstration as needed of appropriate use of medications, inhalers, and oxygen therapy.  Advair and Proventil; spacer given   Expected Outcomes Short Term: Achieves understanding of medications use. Understands that oxygen is a medication prescribed by physician. Demonstrates appropriate use of inhaler and oxygen therapy.   Diabetes Yes   Intervention Provide education about signs/symptoms and action to take for hypo/hyperglycemia.;Provide education about proper nutrition, including hydration, and aerobic/resistive exercise prescription along with prescribed  medications to achieve blood glucose in normal ranges: Fasting glucose 65-99 mg/dL   Expected Outcomes Short Term: Participant verbalizes understanding of the signs/symptoms and immediate care of hyper/hypoglycemia, proper foot care and importance of medication, aerobic/resistive exercise and nutrition plan for blood glucose control.;Long Term: Attainment of HbA1C < 7%.   Hypertension Yes   Intervention Provide education on lifestyle modifcations including regular physical activity/exercise, weight management, moderate sodium restriction and increased consumption of fresh fruit, vegetables, and low fat dairy, alcohol moderation, and smoking cessation.;Monitor prescription use compliance.   Expected Outcomes Short Term: Continued assessment and intervention until BP is < 140/59m HG in hypertensive participants. < 130/822mHG  in hypertensive participants with diabetes, heart failure or chronic kidney disease.;Long Term: Maintenance of blood pressure at goal levels.      Core Components/Risk Factors/Patient Goals Review:      Goals and Risk Factor Review    Row Name 12/01/15 1243 12/10/15 1250 12/10/15 1252 12/10/15 1253 12/29/15 1157     Core Components/Risk Factors/Patient Goals Review   Personal Goals Review Weight Management/Obesity;Sedentary;Increase Strength and Stamina;Hypertension;Diabetes Increase knowledge of respiratory medications and ability to use respiratory devices properly. Improve shortness of breath with ADL's Develop more efficient breathing techniques such as purse lipped breathing and diaphragmatic breathing and practicing self-pacing with activity. Weight Management/Obesity;Sedentary;Diabetes;Hypertension   Review Anh had a good first day of exercise.  He was doing silver sneakers before, but not while in program.  His blood pressure was good here, but he does not check it at home.  His blood sugars have been good at home and here. He was down 1/2 lb today! Mr. Grider takes  an Albuterol MDI and Advair at home for his breathing.  He has a spacer for the albuterol and knows to rinse his mouth after taking the Advair. Mr. Testa has not seen any improventment in his SOB.  He is only on his seventh session. Mr. Schwandt demonstrates proper technique for PLB. Jasraj has not lost weight but is maintaining his current weight.  He consistently attends class 3 days per week and has not yet missed a session.  He never had trouble with ADLs excpet walking up hills or stairs.  Bp has been within normal limits when he comes to class.Marland Kitchen  His a1c level was higher yesterday at the Dr but he feels it may be due to a new medication he takes for pukmonary fibrosis.  He will follow up with his Dr about this.   Expected Outcomes Mitchael will continue to come to exercise and education classes for risk factor modification.  We continue to monitor for progression. Taking his respiratory medications correctly will help reduces SOB and exacerbations.  Continued cardio and strength training should start to help Mr. Zaino improve on his SOB and make ADLs easier for him. PLB will help Mr. Youtsey regain his breath during exercise and episodes of SOB.  Jhonathan will maintain his attendance and see his weight stay the same or go down.  His other vitals - BP and BG will remain well controlled.   Stanton Name 01/05/16 1515 01/17/16 1449           Core Components/Risk Factors/Patient Goals Review   Personal Goals Review Increase Strength and Stamina;Increase knowledge of respiratory medications and ability to use respiratory devices properly.;Develop more efficient breathing techniques such as purse lipped breathing and diaphragmatic breathing and practicing self-pacing with activity. Sedentary;Increase Strength and Stamina;Improve shortness of breath with ADL's;Develop more efficient breathing techniques such as purse lipped breathing and diaphragmatic breathing and practicing self-pacing with activity.;Increase  knowledge of respiratory medications and ability to use respiratory devices properly.;Diabetes;Hypertension;Weight Management/Obesity      Review Mr Burkitt has a good understanding of his MDI's Advair and Proventil. He is taking OFEV for his Pulmonary Fibrosis and has tolerated the medicine well. Mr Serna uses PLB with his exercise goals and activities at home. His mid 65md improved 1062f- minimal importance difference for IPF is [id number] Mr BroGingrasals today. He is maintaining his weight at 214-215lbs and is okay with this. his wife cooks healthy, no salt or sweets, but does like bread. With his history of  Asthma, he takes Advair and Proventil, but is recently using a sample from Dr [name]. Cost of inhalers are a concern, so to have a sample is worth using for a month and possibly changing to due to cost. His Diabetes and blood pressures have not been a concern in [facility] and Mr Sek is compliant with his medicine. His last A1C, was 8.0 and a medicine change was made to his metformin . After [facility], he will continue at the [facility] in the 2 [employer]ne[employer] classes and add another day of exercise with the machines or walking the track at the [facility].         Core Components/Risk Factors/Patient Goals at Discharge (Final Review):      Goals and Risk Factor Review - 01/17/16 1449      Core Components/Risk Factors/Patient Goals Review   Personal Goals Review Sedentary;Increase Strength and Stamina;Improve shortness of breath with ADL's;Develop more efficient breathing techniques such as purse lipped breathing and diaphragmatic breathing and practicing self-pacing with activity.;Increase knowledge of respiratory medications and ability to use respiratory devices properly.;Diabetes;Hypertension;Weight Management/Obesity   Review Reviewed Mr Lengacher goals today. He is maintaining his weight at 214-215lbs and is okay with this. his wife cooks healthy, no salt or sweets, but does like bread.  With his history of Asthma, he takes Advair and Proventil, but is recently using a sample from Dr [name]. Cost of inhalers are a concern, so to have a sample is worth using for a month and possibly changing to due to cost. His Diabetes and blood pressures have not been a concern in [facility] and Mr Fitzsimmons is compliant with his medicine. His last A1C, was 8.0 and a medicine change was made to his metformin . After [facility], he will continue at the [facility] in the 2 Silver [employer]ne[employer] classes and add another day of exercise with the machines or walking the track at the [facility].      ITP Comments:     ITP Comments    Row Name 12/08/15 1313 02/02/16 1220         ITP Comments Kieran is progressing well with exercise. Nachum is scheduled for cataract surgery on 02/08/2016 and will be unable to attend the next several sessions.         Comments: 30 day note review

## 2016-02-08 ENCOUNTER — Ambulatory Visit
Admission: RE | Admit: 2016-02-08 | Discharge: 2016-02-08 | Disposition: A | Discharge status: Home / Self Care | Payer: Medicare Other | Source: Ambulatory Visit | Attending: Ophthalmology | Admitting: Ophthalmology

## 2016-02-08 ENCOUNTER — Ambulatory Visit: Payer: Medicare Other | Admitting: Anesthesiology

## 2016-02-08 ENCOUNTER — Encounter: Payer: Self-pay | Admitting: *Deleted

## 2016-02-08 ENCOUNTER — Encounter
Admission: RE | Disposition: A | Discharge status: Home / Self Care | Payer: Self-pay | Source: Ambulatory Visit | Attending: Ophthalmology

## 2016-02-08 DIAGNOSIS — I10 Essential (primary) hypertension: Secondary | ICD-10-CM | POA: Insufficient documentation

## 2016-02-08 DIAGNOSIS — Z7984 Long term (current) use of oral hypoglycemic drugs: Secondary | ICD-10-CM | POA: Insufficient documentation

## 2016-02-08 DIAGNOSIS — D649 Anemia, unspecified: Secondary | ICD-10-CM | POA: Insufficient documentation

## 2016-02-08 DIAGNOSIS — K219 Gastro-esophageal reflux disease without esophagitis: Secondary | ICD-10-CM | POA: Diagnosis not present

## 2016-02-08 DIAGNOSIS — N289 Disorder of kidney and ureter, unspecified: Secondary | ICD-10-CM | POA: Diagnosis not present

## 2016-02-08 DIAGNOSIS — Z87891 Personal history of nicotine dependence: Secondary | ICD-10-CM | POA: Insufficient documentation

## 2016-02-08 DIAGNOSIS — H2511 Age-related nuclear cataract, right eye: Secondary | ICD-10-CM | POA: Diagnosis not present

## 2016-02-08 DIAGNOSIS — I2510 Atherosclerotic heart disease of native coronary artery without angina pectoris: Secondary | ICD-10-CM | POA: Insufficient documentation

## 2016-02-08 DIAGNOSIS — Z955 Presence of coronary angioplasty implant and graft: Secondary | ICD-10-CM | POA: Diagnosis not present

## 2016-02-08 DIAGNOSIS — I739 Peripheral vascular disease, unspecified: Secondary | ICD-10-CM | POA: Diagnosis not present

## 2016-02-08 DIAGNOSIS — M1990 Unspecified osteoarthritis, unspecified site: Secondary | ICD-10-CM | POA: Insufficient documentation

## 2016-02-08 DIAGNOSIS — E1136 Type 2 diabetes mellitus with diabetic cataract: Secondary | ICD-10-CM | POA: Diagnosis not present

## 2016-02-08 DIAGNOSIS — J45909 Unspecified asthma, uncomplicated: Secondary | ICD-10-CM | POA: Diagnosis not present

## 2016-02-08 HISTORY — PX: CATARACT EXTRACTION W/PHACO: SHX586

## 2016-02-08 HISTORY — DX: Type 2 diabetes mellitus without complications: E11.9

## 2016-02-08 HISTORY — DX: Cough, unspecified: R05.9

## 2016-02-08 HISTORY — DX: Dyspnea, unspecified: R06.00

## 2016-02-08 HISTORY — DX: Unspecified osteoarthritis, unspecified site: M19.90

## 2016-02-08 HISTORY — DX: Wheezing: R06.2

## 2016-02-08 HISTORY — DX: Pulmonary fibrosis, unspecified: J84.10

## 2016-02-08 HISTORY — DX: Cough: R05

## 2016-02-08 LAB — GLUCOSE, CAPILLARY: Glucose-Capillary: 163 mg/dL — ABNORMAL HIGH (ref 65–99)

## 2016-02-08 SURGERY — PHACOEMULSIFICATION, CATARACT, WITH IOL INSERTION
Anesthesia: Monitor Anesthesia Care | Site: Eye | Laterality: Right | Wound class: Clean

## 2016-02-08 MED ORDER — SODIUM CHLORIDE 0.9 % IV SOLN
INTRAVENOUS | Status: DC
  Administered 2016-02-08 (×2): via INTRAVENOUS

## 2016-02-08 MED ORDER — FENTANYL CITRATE (PF) 100 MCG/2ML IJ SOLN
INTRAMUSCULAR | Status: DC | PRN
  Administered 2016-02-08: 50 ug via INTRAVENOUS

## 2016-02-08 MED ORDER — ARMC OPHTHALMIC DILATING DROPS
OPHTHALMIC | Status: AC
  Administered 2016-02-08: 1 via OPHTHALMIC
  Filled 2016-02-08: qty 0.4

## 2016-02-08 MED ORDER — LIDOCAINE HCL (PF) 4 % IJ SOLN
INTRAMUSCULAR | Status: AC
  Filled 2016-02-08: qty 5

## 2016-02-08 MED ORDER — MOXIFLOXACIN HCL 0.5 % OP SOLN
1.0000 [drp] | OPHTHALMIC | Status: DC
  Administered 2016-02-08 (×3): 1 [drp] via OPHTHALMIC

## 2016-02-08 MED ORDER — MIDAZOLAM HCL 2 MG/2ML IJ SOLN
INTRAMUSCULAR | Status: DC | PRN
  Administered 2016-02-08: 1 mg via INTRAVENOUS

## 2016-02-08 MED ORDER — NA CHONDROIT SULF-NA HYALURON 40-17 MG/ML IO SOLN
INTRAOCULAR | Status: AC
  Filled 2016-02-08: qty 1

## 2016-02-08 MED ORDER — ARMC OPHTHALMIC DILATING DROPS
1.0000 "application " | OPHTHALMIC | Status: AC
  Administered 2016-02-08 (×3): 1 via OPHTHALMIC

## 2016-02-08 MED ORDER — POVIDONE-IODINE 5 % OP SOLN
OPHTHALMIC | Status: DC | PRN
  Administered 2016-02-08: 1 via OPHTHALMIC

## 2016-02-08 MED ORDER — BSS IO SOLN
INTRAOCULAR | Status: DC | PRN
  Administered 2016-02-08: 4 mL via OPHTHALMIC

## 2016-02-08 MED ORDER — MOXIFLOXACIN HCL 0.5 % OP SOLN
OPHTHALMIC | Status: AC
  Administered 2016-02-08: 1 [drp] via OPHTHALMIC
  Filled 2016-02-08: qty 3

## 2016-02-08 MED ORDER — NA CHONDROIT SULF-NA HYALURON 40-17 MG/ML IO SOLN
INTRAOCULAR | Status: DC | PRN
  Administered 2016-02-08: 1 mL via INTRAOCULAR

## 2016-02-08 MED ORDER — EPINEPHRINE PF 1 MG/ML IJ SOLN
INTRAMUSCULAR | Status: AC
  Filled 2016-02-08: qty 2

## 2016-02-08 MED ORDER — EPINEPHRINE PF 1 MG/ML IJ SOLN
INTRAOCULAR | Status: DC | PRN
  Administered 2016-02-08: 1 mL via OPHTHALMIC

## 2016-02-08 MED ORDER — MOXIFLOXACIN HCL 0.5 % OP SOLN
OPHTHALMIC | Status: DC | PRN
  Administered 2016-02-08: 1 [drp] via OPHTHALMIC

## 2016-02-08 MED ORDER — CARBACHOL 0.01 % IO SOLN
INTRAOCULAR | Status: DC | PRN
  Administered 2016-02-08: 0.5 mL via INTRAOCULAR

## 2016-02-08 MED ORDER — POVIDONE-IODINE 5 % OP SOLN
OPHTHALMIC | Status: AC
  Filled 2016-02-08: qty 30

## 2016-02-08 SURGICAL SUPPLY — 21 items
CANNULA ANT/CHMB 27GA (MISCELLANEOUS) ×2 IMPLANT
CUP MEDICINE 2OZ PLAST GRAD ST (MISCELLANEOUS) ×2 IMPLANT
GLOVE BIO SURGEON STRL SZ8 (GLOVE) ×2 IMPLANT
GLOVE BIOGEL M 6.5 STRL (GLOVE) ×2 IMPLANT
GLOVE SURG LX 8.0 MICRO (GLOVE) ×1
GLOVE SURG LX STRL 8.0 MICRO (GLOVE) ×1 IMPLANT
GOWN STRL REUS W/ TWL LRG LVL3 (GOWN DISPOSABLE) ×2 IMPLANT
GOWN STRL REUS W/TWL LRG LVL3 (GOWN DISPOSABLE) ×2
LENS IOL TECNIS ITEC 22.0 (Intraocular Lens) ×2 IMPLANT
PACK CATARACT (MISCELLANEOUS) ×2 IMPLANT
PACK CATARACT BRASINGTON LX (MISCELLANEOUS) ×2 IMPLANT
PACK EYE AFTER SURG (MISCELLANEOUS) ×2 IMPLANT
SOL BSS BAG (MISCELLANEOUS) ×2
SOL PREP PVP 2OZ (MISCELLANEOUS) ×2
SOLUTION BSS BAG (MISCELLANEOUS) ×1 IMPLANT
SOLUTION PREP PVP 2OZ (MISCELLANEOUS) ×1 IMPLANT
SYR 3ML LL SCALE MARK (SYRINGE) ×2 IMPLANT
SYR 5ML LL (SYRINGE) ×2 IMPLANT
SYR TB 1ML 27GX1/2 LL (SYRINGE) ×2 IMPLANT
WATER STERILE IRR 250ML POUR (IV SOLUTION) ×2 IMPLANT
WIPE NON LINTING 3.25X3.25 (MISCELLANEOUS) ×2 IMPLANT

## 2016-02-08 NOTE — Anesthesia Postprocedure Evaluation (Signed)
Anesthesia Post Note  Patient: Jeremiah FeilVincent Gibson  Procedure(s) Performed: Procedure(s) (LRB): CATARACT EXTRACTION PHACO AND INTRAOCULAR LENS PLACEMENT (IOC) (Right)  Patient location during evaluation: Short Stay Anesthesia Type: MAC Level of consciousness: awake Pain management: pain level controlled Vital Signs Assessment: post-procedure vital signs reviewed and stable Respiratory status: spontaneous breathing Cardiovascular status: blood pressure returned to baseline Postop Assessment: no headache Anesthetic complications: no    Last Vitals:  Vitals:   02/08/16 0842 02/08/16 1015  BP: (!) 149/71 (!) 162/61  Pulse: 64 (!) 57  Resp: 20 20  Temp: 36.6 C 36.9 C    Last Pain:  Vitals:   02/08/16 1015  TempSrc: Temporal                 Vernie MurdersPope,  Albirta Rhinehart G

## 2016-02-08 NOTE — Op Note (Signed)
PREOPERATIVE DIAGNOSIS:  Nuclear sclerotic cataract of the right eye.   POSTOPERATIVE DIAGNOSIS:  NUCLEAR SCLEROTIC CATARACT RIGHT EYE   OPERATIVE PROCEDURE: Procedure(s): CATARACT EXTRACTION PHACO AND INTRAOCULAR LENS PLACEMENT (IOC)   SURGEON:  Galen ManilaWilliam Ryna Beckstrom, MD.   ANESTHESIA:  No anesthesia staff entered.  1.      Managed anesthesia care. 2.      0.741ml of Shugarcaine was instilled in the eye following the paracentesis.   COMPLICATIONS:  None.   TECHNIQUE:   Stop and chop   DESCRIPTION OF PROCEDURE:  The patient was examined and consented in the preoperative holding area where the aforementioned topical anesthesia was applied to the right eye and then brought back to the Operating Room where the right eye was prepped and draped in the usual sterile ophthalmic fashion and a lid speculum was placed. A paracentesis was created with the side port blade and the anterior chamber was filled with viscoelastic. A near clear corneal incision was performed with the steel keratome. A continuous curvilinear capsulorrhexis was performed with a cystotome followed by the capsulorrhexis forceps. Hydrodissection and hydrodelineation were carried out with BSS on a blunt cannula. The lens was removed in a stop and chop  technique and the remaining cortical material was removed with the irrigation-aspiration handpiece. The capsular bag was inflated with viscoelastic and the Technis [id number]  lens was placed in the capsular bag without complication. The remaining viscoelastic was removed from the eye with the irrigation-aspiration handpiece. The wounds were hydrated. The anterior chamber was flushed with Miostat and the eye was inflated to physiologic pressure. 0.611ml of Vigamox was placed in the anterior chamber. The wounds were found to be water tight. The eye was dressed with Vigamox. The patient was given protective glasses to wear throughout the day and a shield with which to sleep tonight. The patient was also  given drops with which to begin a drop regimen today and will follow-up with me in one day.  Implant Name Type Inv. Item Serial No. Manufacturer Lot No. LRB No. Used  LENS IOL DIOP 22.0 - [id number] Intraocular Lens LENS IOL DIOP 22.0 [id number]   Right 1   Procedure(s) with comments: CATARACT EXTRACTION PHACO AND INTRAOCULAR LENS PLACEMENT (IOC) (Right) - US 41.5 AP% 20..2 CDE 8.38 Fluid pack lot # [id number]  Electronically signed: Mekiah Cambridge LOUIS 02/08/2016 10:12 AM

## 2016-02-08 NOTE — Anesthesia Procedure Notes (Signed)
Procedure Name: MAC Date/Time: 02/08/2016 9:52 AM Performed by: Henrietta HooverPOPE, Shaan Rhoads Pre-anesthesia Checklist: Patient identified, Emergency Drugs available, Suction available, Patient being monitored and Timeout performed Patient Re-evaluated:Patient Re-evaluated prior to inductionOxygen Delivery Method: Nasal cannula Dental Injury: Teeth and Oropharynx as per pre-operative assessment

## 2016-02-08 NOTE — H&P (Signed)
All labs reviewed. Abnormal studies sent to patients PCP when indicated.  Previous H&P reviewed, patient examined, there are NO CHANGES.  Jeremiah Lierman LOUIS11/28/20179:45 AM

## 2016-02-08 NOTE — Anesthesia Preprocedure Evaluation (Signed)
Anesthesia Evaluation  Patient identified by MRN, date of birth, ID band Patient awake    Reviewed: Allergy & Precautions, H&P , NPO status , Patient's Chart, lab work & pertinent test results, reviewed documented beta blocker date and time   History of Anesthesia Complications Negative for: history of anesthetic complications  Airway Mallampati: III  TM Distance: >3 FB Neck ROM: full    Dental  (+) Upper Dentures, Lower Dentures   Pulmonary neg shortness of breath, asthma , neg sleep apnea, neg COPD, neg recent URI, former smoker,    Pulmonary exam normal breath sounds clear to auscultation       Cardiovascular Exercise Tolerance: Good hypertension, (-) angina+ CAD and + Peripheral Vascular Disease  (-) Past MI, (-) Cardiac Stents and (-) CABG Normal cardiovascular exam(-) dysrhythmias (-) Valvular Problems/Murmurs Rhythm:regular Rate:Normal     Neuro/Psych negative neurological ROS  negative psych ROS   GI/Hepatic Neg liver ROS, GERD  ,  Endo/Other  diabetes, Well Controlled, Type 2, Oral Hypoglycemic Agents  Renal/GU Renal disease  negative genitourinary   Musculoskeletal   Abdominal   Peds  Hematology negative hematology ROS (+)   Anesthesia Other Findings Past Medical History: No date: Anemia No date: Arthritis No date: Asthma No date: Colon polyps No date: Cough     Comment: CHRONIC No date: Diabetes mellitus without complication (HCC) No date: Diverticulosis of colon (without mention of he* No date: Dyspnea No date: Esophageal reflux No date: Gout 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 No date: Leg cramps No date: Pulmonary fibrosis (HCC) No date: Wheezing   Reproductive/Obstetrics negative OB ROS                             Anesthesia Physical Anesthesia Plan  ASA: III  Anesthesia Plan: MAC   Post-op Pain Management:    Induction:   Airway Management Planned:    Additional Equipment:   Intra-op Plan:   Post-operative Plan:   Informed Consent: I have reviewed the patients History and Physical, chart, labs and discussed the procedure including the risks, benefits and alternatives for the proposed anesthesia with the patient or authorized representative who has indicated his/her understanding and acceptance.   Dental Advisory Given  Plan Discussed with: Anesthesiologist, CRNA and Surgeon  Anesthesia Plan Comments:         Anesthesia Quick Evaluation

## 2016-02-08 NOTE — Discharge Instructions (Signed)
Eye Surgery Discharge Instructions  Expect mild scratchy sensation or mild soreness. DO NOT RUB YOUR EYE!  The day of surgery:  Minimal physical activity, but bed rest is not required  No reading, computer work, or close hand work  No bending, lifting, or straining.  May watch TV  For 24 hours:  No driving, legal decisions, or alcoholic beverages  Safety precautions  Eat anything you prefer: It is better to start with liquids, then soup then solid foods.  _____ Eye patch should be worn until postoperative exam tomorrow.  ____ Solar shield eyeglasses should be worn for comfort in the sunlight/patch while sleeping  Resume all regular medications including aspirin or Coumadin if these were discontinued prior to surgery. You may shower, bathe, shave, or wash your hair. Tylenol may be taken for mild discomfort.  Call your doctor if you experience significant pain, nausea, or vomiting, fever > 101 or other signs of infection. [phone] or [phone] Specific instructions:  Follow-up Information    PORFILIO,WILLIAM LOUIS, MD Follow up.   Specialty:  Ophthalmology Why:  Tomorrow at 9:25 am. Contact information: [address] [place] [id number] [phone]

## 2016-02-08 NOTE — Transfer of Care (Signed)
Immediate Anesthesia Transfer of Care Note  Patient: Jeremiah Gibson  Procedure(s) Performed: Procedure(s) with comments: CATARACT EXTRACTION PHACO AND INTRAOCULAR LENS PLACEMENT (IOC) (Right) - US 41.5 AP% 20..2 CDE 8.38 Fluid pack lot # [id number]  Patient Location: PACU  Anesthesia Type:MAC  Level of Consciousness: awake  Airway & Oxygen Therapy: Patient Spontanous Breathing and Patient connected to nasal cannula oxygen  Post-op Assessment: Report given to RN and Post -op Vital signs reviewed and stable  Post vital signs: Reviewed and stable  Last Vitals:  Vitals:   02/08/16 0842  BP: (!) 149/71  Pulse: 64  Resp: 20  Temp: 36.6 C    Last Pain:  Vitals:   02/08/16 0842  TempSrc: Oral         Complications: No apparent anesthesia complications

## 2016-02-11 ENCOUNTER — Encounter: Payer: Medicare Other | Attending: Pulmonary Disease

## 2016-02-11 DIAGNOSIS — J4530 Mild persistent asthma, uncomplicated: Secondary | ICD-10-CM | POA: Insufficient documentation

## 2016-02-11 DIAGNOSIS — J849 Interstitial pulmonary disease, unspecified: Secondary | ICD-10-CM | POA: Insufficient documentation

## 2016-02-25 ENCOUNTER — Other Ambulatory Visit: Payer: Self-pay | Admitting: *Deleted

## 2016-02-25 DIAGNOSIS — I10 Essential (primary) hypertension: Secondary | ICD-10-CM

## 2016-02-25 MED ORDER — TRIAMTERENE-HCTZ 37.5-25 MG PO TABS: ORAL_TABLET | ORAL | 4 refills | Status: DC

## 2016-02-28 ENCOUNTER — Encounter: Payer: Self-pay | Admitting: Respiratory Therapy

## 2016-02-28 NOTE — Progress Notes (Signed)
Pulmonary Individual Treatment Plan  Patient Details  Name: Jeremiah Gibson MRN: [id number] Date of Birth: [birth year] Referring Provider:   Flowsheet Row Pulmonary Rehab from 11/23/2015 in [facility] Cardiac and Pulmonary Rehab  Referring Provider  [name]      Initial Encounter Date:  Flowsheet Row Pulmonary Rehab from 11/23/2015 in [facility] Cardiac and Pulmonary Rehab  Date  11/23/15  Referring Provider  [name]      Visit Diagnosis: No diagnosis found.  Patient's Home Medications on Admission:  Current Outpatient Prescriptions:    albuterol (VENTOLIN HFA) 108 (90 Base) MCG/ACT inhaler, Inhale 2 puffs into the lungs every 4 (four) hours as needed for wheezing or shortness of breath., Disp: 3 Inhaler, Rfl: 3   allopurinol (ZYLOPRIM) 100 MG tablet, Take 2 tablets (200 mg total) by mouth 2 (two) times daily., Disp: 360 tablet, Rfl: 3   amLODipine (NORVASC) 5 MG tablet, Take 1 tablet by mouth  every day for blood  pressure, Disp: 90 tablet, Rfl: 3   aspirin 81 MG tablet, Take 81 mg by mouth every evening. , Disp: , Rfl:    finasteride (PROSCAR) 5 MG tablet, Take 1 tablet by mouth  every day, Disp: 90 tablet, Rfl: 3   fish oil-omega-3 fatty acids 1000 MG capsule, Take 1 g by mouth 2 (two) times daily. , Disp: , Rfl:    Fluticasone-Salmeterol (ADVAIR DISKUS) 250-50 MCG/DOSE AEPB, Inhale 1 puff into the lungs 2 (two) times daily., Disp: 14 each, Rfl: 0   glipiZIDE (GLUCOTROL XL) 10 MG 24 hr tablet, Take 1 tablet by mouth  daily with food for  diabetes; take with largest meal of the day, Disp: 90 tablet, Rfl: 3   losartan (COZAAR) 100 MG tablet, Take 1 tablet by mouth  every day for blood  pressure, Disp: 90 tablet, Rfl: 3   Magnesium 400 MG TABS, Take 1 tablet by mouth 2 (two) times daily., Disp: , Rfl:    metFORMIN (GLUCOPHAGE) 500 MG tablet, Take 2 tablets (1,000 mg total) by mouth 2 (two) times daily with a meal., Disp: 360 tablet, Rfl: 4   MULTIPLE VITAMIN PO, MULTIVITAMINS (Oral Tablet)  1  Every Day for 0 days  Quantity: 0.00;  Refills: 0   Ordered :06-Jan-2010  Doy Hutching ;  Started 22-Mar-2009 Active, Disp: , Rfl:    Nintedanib (OFEV) 100 MG CAPS, Take 100 mg by mouth 2 (two) times daily., Disp: , Rfl:    pantoprazole (PROTONIX) 20 MG tablet, Take 20 mg by mouth 2 (two) times daily., Disp: , Rfl:    potassium chloride SA (K-DUR,KLOR-CON) 20 MEQ tablet, Take 1 tablet by mouth  daily, Disp: 90 tablet, Rfl: 3   simvastatin (ZOCOR) 20 MG tablet, Take 1 tablet (20 mg total) by mouth daily. (Patient taking differently: Take 20 mg by mouth daily at 6 PM. ), Disp: 90 tablet, Rfl: 0   terazosin (HYTRIN) 5 MG capsule, Take 1 capsule by mouth  every day for prostate, Disp: 90 capsule, Rfl: 3   triamterene-hydrochlorothiazide (MAXZIDE-25) 37.5-25 MG tablet, Take 1 tablet by mouth  Every Day for blood  pressure, Disp: 90 tablet, Rfl: 4  Past Medical History: Past Medical History:  Diagnosis Date   Anemia    Arthritis    Asthma    Colon polyps    Cough    CHRONIC   Diabetes mellitus without complication (HCC)    Diverticulosis of colon (without mention of hemorrhage)    Dyspnea    Esophageal reflux  Gout    Impotence of organic origin    Leg cramps    Pulmonary fibrosis (HCC)    Wheezing     Tobacco Use: History  Smoking Status   Former Smoker   Packs/day: 2.00   Years: 20.00   Types: Cigarettes, Pipe   Quit date: 11/22/1999  Smokeless Tobacco   Never Used    Comment: quit cigs in 72 and pipe in 01    Labs: Recent Review Flowsheet Data    Labs for ITP Cardiac and Pulmonary Rehab Latest Ref Rng & Units 11/04/2014 02/09/2015 05/11/2015 08/10/2015 12/28/2015   Cholestrol 100 - 199 mg/dL - - 177 - 158   LDLCALC 0 - 99 mg/dL - - 99 - 91   HDL >39 mg/dL - - 42 - 39(L)   Trlycerides 0 - 149 mg/dL - - 178(H) - 139   Hemoglobin A1c 4.8 - 5.6 % 7.7 7.9 7.8 7.9 8.6(H)       ADL UCSD:     Pulmonary Assessment Scores    Row Name 11/23/15  1311 01/05/16 1512       ADL UCSD   ADL Phase Entry Mid    SOB Score total 8 32    Rest 0 0    Walk 0 1    Stairs 1 3    Bath 0 1    Dress 0 2    Shop 0 1       Pulmonary Function Assessment:     Pulmonary Function Assessment - 11/23/15 1310      Initial Spirometry Results   FVC% 86 %   FEV1% 98 %   FEV1/FVC Ratio 82   Comments Test date 07/20/15     Post Bronchodilator Spirometry Results   FVC% 81 %   FEV1% 93 %   FEV1/FVC Ratio 82     Breath   Bilateral Breath Sounds Basilar;Rales   Shortness of Breath Yes      Exercise Target Goals:    Exercise Program Goal: Individual exercise prescription set with THRR, safety & activity barriers. Participant demonstrates ability to understand and report RPE using BORG scale, to self-measure pulse accurately, and to acknowledge the importance of the exercise prescription.  Exercise Prescription Goal: Starting with aerobic activity 30 plus minutes a day, 3 days per week for initial exercise prescription. Provide home exercise prescription and guidelines that participant acknowledges understanding prior to discharge.  Activity Barriers & Risk Stratification:     Activity Barriers & Cardiac Risk Stratification - 11/23/15 1309      Activity Barriers & Cardiac Risk Stratification   Activity Barriers Shortness of Breath;Deconditioning   Cardiac Risk Stratification Moderate      6 Minute Walk:     6 Minute Walk    Row Name 11/23/15 1236 01/05/16 1241 02/02/16 1049     6 Minute Walk   Phase  -- Mid Program Discharge   Distance 1320 feet 1425 feet 1476 feet   Distance % Change  -- 7.5 % 11.8 %   Walk Time 6 minutes 6 minutes 6 minutes   # of Rest Breaks  -- 0 0   MPH 2.5 2.69 2.79   METS 2.49 2.82 2.88   RPE _0 Perceived Dyspnea  _1 VO2 Peak 8.7 9.8 10.1   Symptoms No No No   Resting HR 85 bpm 88 bpm 90 bpm   Resting BP 124/76 126/74 124/64   Max Ex. HR 105  bpm 113 bpm 126 bpm   Max Ex. BP 144/64  152/56 132/74   2 Minute Post BP  --  -- 134/70     Interval HR   Baseline HR 85 88 90   1 Minute HR 92 104 102   2 Minute HR 93 107  --   3 Minute HR 102  -- 124   4 Minute HR 103 113  --   5 Minute HR 103 112 126   6 Minute HR 105 112 113   2 Minute Post HR 96 97  --   Interval Heart Rate? Yes Yes Yes     Interval Oxygen   Interval Oxygen? Yes Yes  --   Baseline Oxygen Saturation % 97 % 94 % 94 %   1 Minute Oxygen Saturation % 93 % 94 % 94 %   2 Minute Oxygen Saturation % 88 % 94 % 94 %   3 Minute Oxygen Saturation % 89 %  -- 94 %   4 Minute Oxygen Saturation % 90 % 94 % 94 %   5 Minute Oxygen Saturation % 91 % 94 % 94 %   6 Minute Oxygen Saturation % 93 % 94 % 94 %   2 Minute Post Oxygen Saturation % 96 % 94 % 96 %      Initial Exercise Prescription:     Initial Exercise Prescription - 11/23/15 1200      Date of Initial Exercise RX and Referring Provider   Date 11/23/15   Referring Provider [name]     Treadmill   MPH 1.7   Grade 0.5   Minutes 15   METs 2.42     Recumbant Bike   Level 1   RPM 60   Minutes 15   METs 2.3     NuStep   Level 2   Minutes 15   METs 2     T5 Nustep   Level 1   Minutes 15   METs 2     Biostep-RELP   Level 2   Minutes 15   METs 2     Prescription Details   Frequency (times per week) 3   Duration Progress to 45 minutes of aerobic exercise without signs/symptoms of physical distress     Intensity   THRR 40-80% of Max Heartrate 107-130   Ratings of Perceived Exertion 11-13   Perceived Dyspnea 0-4     Progression   Progression Continue to progress workloads to maintain intensity without signs/symptoms of physical distress.     Resistance Training   Training Prescription Yes   Weight 2   Reps 10-15      Perform Capillary Blood Glucose checks as needed.  Exercise Prescription Changes:     Exercise Prescription Changes    Row Name 11/29/15 1400 12/08/15 1300 12/22/15 1500 01/05/16 1400 01/18/16 1400      Exercise Review   Progression  -- Yes Yes Yes Yes     Response to Exercise   Blood Pressure (Admit) 120/70 120/62 136/76 126/74 126/74   Blood Pressure (Exercise) 134/82 148/82 126/64 152/86 128/66   Blood Pressure (Exit) 138/64 134/78 116/64 126/66 144/82   Heart Rate (Admit) 76 bpm 94 bpm 95 bpm 94 bpm 82 bpm   Heart Rate (Exercise) 98 bpm 94 bpm 115 bpm 95 bpm 83 bpm   Heart Rate (Exit) 74 bpm 88 bpm 86 bpm 98 bpm 78 bpm   Oxygen Saturation (Admit) 97 % 95 % 97 % 88 %  97 %   Oxygen Saturation (Exercise) 97 % 95 % 90 % 96 % 96 %   Oxygen Saturation (Exit) 98 % 96 % 97 % 98 % 97 %   Rating of Perceived Exertion (Exercise) _0 Perceived Dyspnea (Exercise) _1 Symptoms _2    Duration Progress to 45 minutes of aerobic exercise without signs/symptoms of physical distress Progress to 45 minutes of aerobic exercise without signs/symptoms of physical distress Progress to 45 minutes of aerobic exercise without signs/symptoms of physical distress Progress to 45 minutes of aerobic exercise without signs/symptoms of physical distress Progress to 45 minutes of aerobic exercise without signs/symptoms of physical distress   Intensity _3      Progression   Progression Continue to progress workloads to maintain intensity without signs/symptoms of physical distress. Continue to progress workloads to maintain intensity without signs/symptoms of physical distress. Continue to progress workloads to maintain intensity without signs/symptoms of physical distress. Continue to progress workloads to maintain intensity without signs/symptoms of physical distress. Continue to progress workloads to maintain intensity without signs/symptoms of physical distress.   Average METs  --  -- 2.05 2.2 2     Resistance Training   Training Prescription _4    Weight _5 lbs 4 lbs 4 lbs   Reps 10-15  10-15 10-12 10-12 10-12     Interval Training   Interval Training _6      Treadmill   MPH 1  --  --  --  --   Grade 0.5  --  --  --  --   Minutes 10  5/5  --  --  --  --     Arm/Foot Ergometer   Level  -- _7 4.5   Watts  --  --  -- --  50 rpm  --   Minutes  -- _8 T5 Nustep   Level _9 80 spm 5   Minutes _10 METs  -- 2 2.1 2.3 2     Biostep-RELP   Level _11 40 spm 6   Minutes _12 METs  -- _13 Row Name 01/19/16 1500 02/01/16 1500           Exercise Review   Progression Yes Yes        Response to Exercise   Blood Pressure (Admit)  -- 116/70      Blood Pressure (Exercise)  -- 148/68      Blood Pressure (Exit)  -- 124/70      Heart Rate (Admit)  -- 90 bpm      Heart Rate (Exercise)  -- 92 bpm      Heart Rate (Exit)  -- 84 bpm      Oxygen Saturation (Admit)  -- 96 %      Oxygen Saturation (Exercise)  -- 96 %      Oxygen Saturation (Exit)  -- 97 %      Rating of Perceived Exertion (Exercise)  -- 16      Perceived Dyspnea (Exercise)  -- 4      Symptoms none none      Comments Home Exercise Guidelines given 01/19/16 Home Exercise Guidelines given  01/19/16      Duration Progress to 45 minutes of aerobic exercise without signs/symptoms of physical distress Progress to 45 minutes of aerobic exercise without signs/symptoms of physical distress      Intensity THRR unchanged THRR unchanged        Progression   Progression Continue to progress workloads to maintain intensity without signs/symptoms of physical distress. Continue to progress workloads to maintain intensity without signs/symptoms of physical distress.      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 Add 2 additional days to program exercise sessions.         Exercise Comments:     Exercise Comments    Row Name 11/29/15 1503 12/08/15 1313 12/22/15 1554 01/05/16 1450 01/18/16 1416   Exercise Comments Mr Homan did well his first day of exercise.  Exercise equipment safety, infection control, and diabetes, high and low blood sugar signs were reveiwed. Hobart is progressing well with exercise. Leviathan is making good progress in rehab.  He is now up to level 4 on T5 and BioStep.  We wll continue to monitor his progression. Alton completed his mid 6 min walk test today.  He has already impoved by 105 ft (7.5%)!!  We will continue to monitor his progress. Green continues to do well with exercise. He is already over half way through the program.  We will continue to monitor his progression.   [place] Name 01/19/16 1548 02/01/16 1541         Exercise Comments Reviewed home exercise with pt today.  Pt plans to walk and go to [facility] with his [employer] for exercise.  Reviewed THR, pulse, RPE, sign and symptoms, and when to call 911 or MD.  Also discussed weather considerations and indoor options.  Pt voiced understanding.    Kenyatta continues to do well.  We will continue to watch for progression. Mackson has continued to do well in rehab.  We will continue to monitor his progression.         Discharge Exercise Prescription (Final Exercise Prescription Changes):     Exercise Prescription Changes - 02/01/16 1500      Exercise Review   Progression Yes     Response to Exercise   Blood Pressure (Admit) 116/70   Blood  Pressure (Exercise) 148/68   Blood Pressure (Exit) 124/70   Heart Rate (Admit) 90 bpm   Heart Rate (Exercise) 92  bpm   Heart Rate (Exit) 84 bpm   Oxygen Saturation (Admit) 96 %   Oxygen Saturation (Exercise) 96 %   Oxygen Saturation (Exit) 97 %   Rating of Perceived Exertion (Exercise) 16   Perceived Dyspnea (Exercise) 4   Symptoms none   Comments Home Exercise Guidelines given 01/19/16   Duration Progress to 45 minutes of aerobic exercise without signs/symptoms of physical distress   Intensity THRR unchanged     Progression   Progression Continue to progress workloads to maintain intensity without signs/symptoms of physical distress.   Average METs 2.1     Resistance Training   Training Prescription Yes   Weight 4 lbs   Reps 10-12     Interval Training   Interval Training No     Arm/Foot Ergometer   Level 4.5   Watts --  40 rpm   Minutes 15     T5 Nustep   Level 5   Minutes 15   METs 2.2     Biostep-RELP   Level 6   Minutes 15   METs 2     Home Exercise Plan   Plans to continue exercise at [employer] (comment)  walking and [employer] ([employer])   Frequency Add 2 additional days to program exercise sessions.       Nutrition:  Target Goals: Understanding of nutrition guidelines, daily intake of sodium <1585m, cholesterol <2062m calories 30% from fat and 7% or less from saturated fats, daily to have 5 or more servings of fruits and vegetables.  Biometrics:     Pre Biometrics - 11/23/15 1234      Pre Biometrics   Height _0  (1.778 m)   Weight 214 lb 8 oz (97.3 kg)   Waist Circumference 44 inches   Hip Circumference 43 inches   Waist to Hip Ratio 1.02 %   BMI (Calculated) 30.8         Post Biometrics - 02/02/16 1048       Post  Biometrics   Height _1  (1.778 m)   Weight 213 lb (96.6 kg)   Waist Circumference 43 inches   Hip Circumference 43 inches   Waist to Hip Ratio 1 %   BMI (Calculated) 30.6      Nutrition Therapy Plan  and Nutrition Goals:   Nutrition Discharge: Rate Your Plate Scores:   Psychosocial: Target Goals: Acknowledge presence or absence of depression, maximize coping skills, provide positive support system. Participant is able to verbalize types and ability to use techniques and skills needed for reducing stress and depression.  Initial Review & Psychosocial Screening:     Initial Psych Review & Screening - 11/23/15 [email or web address]   Comments Mr BrMarcilas great support from his wife. He has accepted his pulmonary fibrosis and is very positive about managing the disease.     Barriers   Psychosocial barriers to participate in program The patient should benefit from training in stress management and relaxation.     Screening Interventions   Interventions Encouraged to exercise      Quality of Life Scores:     Quality of Life - 11/23/15 1333      Quality of Life Scores   Health/Function Pre 20.19 %   Socioeconomic Pre 20.4 %   Psych/Spiritual Pre 20.29 %   Family Pre 20 %   GLOBAL Pre 20.21 %      PHQ-9: Recent Review Flowsheet  Data    Depression screen [facility] 2/9 12/28/2015 11/23/2015   Decreased Interest 0 0   Down, Depressed, Hopeless 0 0   PHQ - 2 Score 0 0   Altered sleeping - 0   Tired, decreased energy - 0   Change in appetite - 0   Feeling bad or failure about yourself  - 0   Trouble concentrating - 0   Moving slowly or fidgety/restless - 0   Suicidal thoughts - 0   PHQ-9 Score - 0      Psychosocial Evaluation and Intervention:     Psychosocial Evaluation - 12/01/15 1131      Psychosocial Evaluation & Interventions   Interventions Encouraged to exercise with the program and follow exercise prescription   Comments Counselor met with Mr. Mckillop today for initial psychosocial evaluation.  He is a 80 year old who has been recently diagnosed with pulmonary fibrosis.  He has a strong support system with a spouse of 80 years;  friends close by & active involvement in his local church community.  Mr. Matherne has other health issues that are challenging in addition to the pulmonary fibrosis; he has diabetes; high cholesterol and high blood pressure.  He states he sleeps well and has a good appetite.  He denies a history or current symptoms of depresssion or anxiety and is typically in a positive mood.  Mr. Vanwingerden reports minimal stress in his life other than his health issues.  He has goals to breathe better while in this program and plans to continue exercising with his [employer] class upon completion in this pulmonary rehab class.        Psychosocial Re-Evaluation:     Psychosocial Re-Evaluation    [place] Name 01/26/16 1147             Psychosocial Re-Evaluation   Comments Counselor follow up with Mr. B today reporting he is feeling stronger and his legs are especially stronger since coming into this program.  He is sleeping well and continues to have a good appetite.  He states his breathing is "about the same."  Mr. B reports the stress in his life is manageable and he continues to have a positive attitude and enjoys coming to these classes.           Education: Education Goals: Education classes will be provided on a weekly basis, covering required topics. Participant will state understanding/return demonstration of topics presented.  Learning Barriers/Preferences:     Learning Barriers/Preferences - 11/23/15 1310      Learning Barriers/Preferences   Learning Barriers None   Learning Preferences Group Instruction;Individual Instruction;Pictoral;Skilled Demonstration;Verbal Instruction;Video;Written Material      Education Topics: Initial Evaluation Education: - Verbal, written and demonstration of respiratory meds, RPE/PD scales, oximetry and breathing techniques. Instruction on use of nebulizers and MDIs: cleaning and proper use, rinsing mouth with steroid doses and importance of monitoring MDI  activations. Flowsheet Row Pulmonary Rehab from 01/31/2016 in [facility] Cardiac and Pulmonary Rehab  Date  11/23/15  Educator  LB  Instruction Review Code  2- meets goals/outcomes      General Nutrition Guidelines/Fats and Fiber: -Group instruction provided by verbal, written material, models and posters to present the general guidelines for heart healthy nutrition. Gives an explanation and review of dietary fats and fiber. Flowsheet Row Pulmonary Rehab from 01/31/2016 in [facility] Cardiac and Pulmonary Rehab  Date  01/31/16  Educator  CR  Instruction Review Code  2- meets goals/outcomes  Controlling Sodium/Reading Food Labels: -Group verbal and written material supporting the discussion of sodium use in heart healthy nutrition. Review and explanation with models, verbal and written materials for utilization of the food label.   Exercise Physiology & Risk Factors: - Group verbal and written instruction with models to review the exercise physiology of the cardiovascular system and associated critical values. Details cardiovascular disease risk factors and the goals associated with each risk factor. Flowsheet Row Pulmonary Rehab from 01/31/2016 in [facility] Cardiac and Pulmonary Rehab  Date  01/26/16  Educator  AS  Instruction Review Code  2- meets goals/outcomes      Aerobic Exercise & Resistance Training: - Gives group verbal and written discussion on the health impact of inactivity. On the components of aerobic and resistive training programs and the benefits of this training and how to safely progress through these programs. Flowsheet Row Pulmonary Rehab from 01/31/2016 in [facility] Cardiac and Pulmonary Rehab  Date  12/01/15  Educator  Nada Maclachlan  Instruction Review Code  2- meets goals/outcomes      Flexibility, Balance, General Exercise Guidelines: - Provides group verbal and written instruction on the benefits of flexibility and balance training programs. Provides general exercise  guidelines with specific guidelines to those with heart or lung disease. Demonstration and skill practice provided.   Stress Management: - Provides group verbal and written instruction about the health risks of elevated stress, cause of high stress, and healthy ways to reduce stress.   Depression: - Provides group verbal and written instruction on the correlation between heart/lung disease and depressed mood, treatment options, and the stigmas associated with seeking treatment.   Exercise & Equipment Safety: - Individual verbal instruction and demonstration of equipment use and safety with use of the equipment. Flowsheet Row Pulmonary Rehab from 01/31/2016 in [facility] Cardiac and Pulmonary Rehab  Date  11/29/15  Educator  AS  Instruction Review Code  2- meets goals/outcomes      Infection Prevention: - Provides verbal and written material to individual with discussion of infection control including proper hand washing and proper equipment cleaning during exercise session. Flowsheet Row Pulmonary Rehab from 01/31/2016 in [facility] Cardiac and Pulmonary Rehab  Date  11/29/15  Educator  AS  Instruction Review Code  2- meets goals/outcomes      Falls Prevention: - Provides verbal and written material to individual with discussion of falls prevention and safety. Flowsheet Row Pulmonary Rehab from 01/31/2016 in [facility] Cardiac and Pulmonary Rehab  Date  11/23/15  Educator  LB  Instruction Review Code  2- meets goals/outcomes      Diabetes: - Individual verbal and written instruction to review signs/symptoms of diabetes, desired ranges of glucose level fasting, after meals and with exercise. Advice that pre and post exercise glucose checks will be done for 3 sessions at entry of program. Flowsheet Row Pulmonary Rehab from 01/31/2016 in [facility] Cardiac and Pulmonary Rehab  Date  11/29/15  Educator  AS  Instruction Review Code  2- meets goals/outcomes      Chronic Lung Diseases: - Group  verbal and written instruction to review new updates, new respiratory medications, new advancements in procedures and treatments. Provide informative websites and "800" numbers of self-education. Flowsheet Row Pulmonary Rehab from 01/31/2016 in [facility] Cardiac and Pulmonary Rehab  Date  12/27/15  Educator  LB  Instruction Review Code  2- meets goals/outcomes      Lung Procedures: - Group verbal and written instruction to describe testing methods done to diagnose lung disease. Review  the outcome of test results. Describe the treatment choices: Pulmonary Function Tests, ABGs and oximetry.   Energy Conservation: - Provide group verbal and written instruction for methods to conserve energy, plan and organize activities. Instruct on pacing techniques, use of adaptive equipment and posture/positioning to relieve shortness of breath.   Triggers: - Group verbal and written instruction to review types of environmental controls: home humidity, furnaces, filters, dust mite/pet prevention, HEPA vacuums. To discuss weather changes, air quality and the benefits of nasal washing.   Exacerbations: - Group verbal and written instruction to provide: warning signs, infection symptoms, calling MD promptly, preventive modes, and value of vaccinations. Review: effective airway clearance, coughing and/or vibration techniques. Create an Sports administrator.   Oxygen: - Individual and group verbal and written instruction on oxygen therapy. Includes supplement oxygen, available portable oxygen systems, continuous and intermittent flow rates, oxygen safety, concentrators, and Medicare reimbursement for oxygen.   Respiratory Medications: - Group verbal and written instruction to review medications for lung disease. Drug class, frequency, complications, importance of spacers, rinsing mouth after steroid MDI's, and proper cleaning methods for nebulizers. Flowsheet Row Pulmonary Rehab from 01/31/2016 in [facility] Cardiac and  Pulmonary Rehab  Date  11/23/15  Educator  LB  Instruction Review Code  2- meets goals/outcomes      AED/CPR: - Group verbal and written instruction with the use of models to demonstrate the basic use of the AED with the basic ABC's of resuscitation. Flowsheet Row Pulmonary Rehab from 01/31/2016 in [facility] Cardiac and Pulmonary Rehab  Date  01/21/16  Educator  CE  Instruction Review Code  2- meets goals/outcomes      Breathing Retraining: - Provides individuals verbal and written instruction on purpose, frequency, and proper technique of diaphragmatic breathing and pursed-lipped breathing. Applies individual practice skills. Flowsheet Row Pulmonary Rehab from 01/31/2016 in [facility] Cardiac and Pulmonary Rehab  Date  11/23/15  Educator  LB  Instruction Review Code  2- meets goals/outcomes      Anatomy and Physiology of the Lungs: - Group verbal and written instruction with the use of models to provide basic lung anatomy and physiology related to function, structure and complications of lung disease.   Heart Failure: - Group verbal and written instruction on the basics of heart failure: signs/symptoms, treatments, explanation of ejection fraction, enlarged heart and cardiomyopathy.   Sleep Apnea: - Individual verbal and written instruction to review Obstructive Sleep Apnea. Review of risk factors, methods for diagnosing and types of masks and machines for OSA.   Anxiety: - Provides group, verbal and written instruction on the correlation between heart/lung disease and anxiety, treatment options, and management of anxiety. Flowsheet Row Pulmonary Rehab from 01/31/2016 in [facility] Cardiac and Pulmonary Rehab  Date  12/15/15  Educator  [facility]  Instruction Review Code  2- Meets goals/outcomes      Relaxation: - Provides group, verbal and written instruction about the benefits of relaxation for patients with heart/lung disease. Also provides patients with examples of relaxation  techniques. Flowsheet Row Pulmonary Rehab from 01/31/2016 in [facility] Cardiac and Pulmonary Rehab  Date  01/12/16  Educator  [facility]  Instruction Review Code  2- Meets goals/outcomes      Knowledge Questionnaire Score:     Knowledge Questionnaire Score - 11/23/15 1310      Knowledge Questionnaire Score   Pre Score 8/10       Core Components/Risk Factors/Patient Goals at Admission:     Personal Goals and Risk Factors at Admission - 11/23/15 1319  Core Components/Risk Factors/Patient Goals on Admission    Weight Management Yes   Intervention Weight Management: Develop a combined nutrition and exercise program designed to reach desired caloric intake, while maintaining appropriate intake of nutrient and fiber, sodium and fats, and appropriate energy expenditure required for the weight goal.  Mr Dobrowski prefers not to meet with the dietitian. His wife is a good , healthy cook. He does like bread, but not sweets.   Admit Weight 214 lb 6.4 oz (97.3 kg)   Goal Weight: Short Term 209 lb (94.8 kg)   Goal Weight: Long Term 204 lb (92.5 kg)   Expected Outcomes Understanding of distribution of calorie intake throughout the day with the consumption of 4-5 meals/snacks   Sedentary Yes   Intervention Provide advice, education, support and counseling about physical activity/exercise needs.;Develop an individualized exercise prescription for aerobic and resistive training based on initial evaluation findings, risk stratification, comorbidities and participant's personal goals.  He does the [employer]ne[employer] class twice a week..   Expected Outcomes Achievement of increased cardiorespiratory fitness and enhanced flexibility, muscular endurance and strength shown through measurements of functional capacity and personal statement of participant.   Increase Strength and Stamina Yes   Intervention Provide advice, education, support and counseling about physical activity/exercise needs.;Develop an individualized  exercise prescription for aerobic and resistive training based on initial evaluation findings, risk stratification, comorbidities and participant's personal goals.   Expected Outcomes Achievement of increased cardiorespiratory fitness and enhanced flexibility, muscular endurance and strength shown through measurements of functional capacity and personal statement of participant.   Improve shortness of breath with ADL's Yes   Intervention Provide education, individualized exercise plan and daily activity instruction to help decrease symptoms of SOB with activities of daily living.   Expected Outcomes Short Term: Achieves a reduction of symptoms when performing activities of daily living.   Develop more efficient breathing techniques such as purse lipped breathing and diaphragmatic breathing; and practicing self-pacing with activity Yes   Intervention Provide education, demonstration and support about specific breathing techniuqes utilized for more efficient breathing. Include techniques such as pursed lipped breathing, diaphragmatic breathing and self-pacing activity.   Expected Outcomes Short Term: Participant will be able to demonstrate and use breathing techniques as needed throughout daily activities.   Increase knowledge of respiratory medications and ability to use respiratory devices properly  Yes   Intervention Provide education and demonstration as needed of appropriate use of medications, inhalers, and oxygen therapy.  Advair and Proventil; spacer given   Expected Outcomes Short Term: Achieves understanding of medications use. Understands that oxygen is a medication prescribed by physician. Demonstrates appropriate use of inhaler and oxygen therapy.   Diabetes Yes   Intervention Provide education about signs/symptoms and action to take for hypo/hyperglycemia.;Provide education about proper nutrition, including hydration, and aerobic/resistive exercise prescription along with prescribed  medications to achieve blood glucose in normal ranges: Fasting glucose 65-99 mg/dL   Expected Outcomes Short Term: Participant verbalizes understanding of the signs/symptoms and immediate care of hyper/hypoglycemia, proper foot care and importance of medication, aerobic/resistive exercise and nutrition plan for blood glucose control.;Long Term: Attainment of HbA1C < 7%.   Hypertension Yes   Intervention Provide education on lifestyle modifcations including regular physical activity/exercise, weight management, moderate sodium restriction and increased consumption of fresh fruit, vegetables, and low fat dairy, alcohol moderation, and smoking cessation.;Monitor prescription use compliance.   Expected Outcomes Short Term: Continued assessment and intervention until BP is < 140/59m HG in hypertensive participants. < 130/822mHG  in hypertensive participants with diabetes, heart failure or chronic kidney disease.;Long Term: Maintenance of blood pressure at goal levels.      Core Components/Risk Factors/Patient Goals Review:      Goals and Risk Factor Review    Row Name 12/01/15 1243 12/10/15 1250 12/10/15 1252 12/10/15 1253 12/29/15 1157     Core Components/Risk Factors/Patient Goals Review   Personal Goals Review Weight Management/Obesity;Sedentary;Increase Strength and Stamina;Hypertension;Diabetes Increase knowledge of respiratory medications and ability to use respiratory devices properly. Improve shortness of breath with ADL's Develop more efficient breathing techniques such as purse lipped breathing and diaphragmatic breathing and practicing self-pacing with activity. Weight Management/Obesity;Sedentary;Diabetes;Hypertension   Review Anh had a good first day of exercise.  He was doing silver sneakers before, but not while in program.  His blood pressure was good here, but he does not check it at home.  His blood sugars have been good at home and here. He was down 1/2 lb today! Mr. Grider takes  an Albuterol MDI and Advair at home for his breathing.  He has a spacer for the albuterol and knows to rinse his mouth after taking the Advair. Mr. Testa has not seen any improventment in his SOB.  He is only on his seventh session. Mr. Schwandt demonstrates proper technique for PLB. Jasraj has not lost weight but is maintaining his current weight.  He consistently attends class 3 days per week and has not yet missed a session.  He never had trouble with ADLs excpet walking up hills or stairs.  Bp has been within normal limits when he comes to class.Marland Kitchen  His a1c level was higher yesterday at the Dr but he feels it may be due to a new medication he takes for pukmonary fibrosis.  He will follow up with his Dr about this.   Expected Outcomes Mitchael will continue to come to exercise and education classes for risk factor modification.  We continue to monitor for progression. Taking his respiratory medications correctly will help reduces SOB and exacerbations.  Continued cardio and strength training should start to help Mr. Zaino improve on his SOB and make ADLs easier for him. PLB will help Mr. Youtsey regain his breath during exercise and episodes of SOB.  Jhonathan will maintain his attendance and see his weight stay the same or go down.  His other vitals - BP and BG will remain well controlled.   Stanton Name 01/05/16 1515 01/17/16 1449           Core Components/Risk Factors/Patient Goals Review   Personal Goals Review Increase Strength and Stamina;Increase knowledge of respiratory medications and ability to use respiratory devices properly.;Develop more efficient breathing techniques such as purse lipped breathing and diaphragmatic breathing and practicing self-pacing with activity. Sedentary;Increase Strength and Stamina;Improve shortness of breath with ADL's;Develop more efficient breathing techniques such as purse lipped breathing and diaphragmatic breathing and practicing self-pacing with activity.;Increase  knowledge of respiratory medications and ability to use respiratory devices properly.;Diabetes;Hypertension;Weight Management/Obesity      Review Mr Burkitt has a good understanding of his MDI's Advair and Proventil. He is taking OFEV for his Pulmonary Fibrosis and has tolerated the medicine well. Mr Serna uses PLB with his exercise goals and activities at home. His mid 65md improved 1062f- minimal importance difference for IPF is [id number] Mr BroGingrasals today. He is maintaining his weight at 214-215lbs and is okay with this. his wife cooks healthy, no salt or sweets, but does like bread. With his history of  Asthma, he takes Advair and Proventil, but is recently using a sample from Dr [name]. Cost of inhalers are a concern, so to have a sample is worth using for a month and possibly changing to due to cost. His Diabetes and blood pressures have not been a concern in [facility] and Mr Sek is compliant with his medicine. His last A1C, was 8.0 and a medicine change was made to his metformin . After [facility], he will continue at the [facility] in the 2 Silver Sneakers classes and add another day of exercise with the machines or walking the track at the [facility].         Core Components/Risk Factors/Patient Goals at Discharge (Final Review):      Goals and Risk Factor Review - 01/17/16 1449      Core Components/Risk Factors/Patient Goals Review   Personal Goals Review Sedentary;Increase Strength and Stamina;Improve shortness of breath with ADL's;Develop more efficient breathing techniques such as purse lipped breathing and diaphragmatic breathing and practicing self-pacing with activity.;Increase knowledge of respiratory medications and ability to use respiratory devices properly.;Diabetes;Hypertension;Weight Management/Obesity   Review Reviewed Mr Lengacher goals today. He is maintaining his weight at 214-215lbs and is okay with this. his wife cooks healthy, no salt or sweets, but does like bread.  With his history of Asthma, he takes Advair and Proventil, but is recently using a sample from Dr [name] [facility]. Cost of inhalers are a concern, so to have a sample is worth using for a month and possibly changing to due to cost. His Diabetes and blood pressures have not been a concern in [facility] and Mr Fitzsimmons is compliant with his medicine. His last A1C, was 8.0 and a medicine change was made to his metformin . After [facility], he will continue at the [facility] in the 2 Silver Sneakers classes and add another day of exercise with the machines or walking the track at the [facility].      ITP Comments:     ITP Comments    Row Name 12/08/15 1313 02/02/16 1220         ITP Comments Kieran is progressing well with exercise. Nachum is scheduled for cataract surgery on 02/08/2016 and will be unable to attend the next several sessions.         Comments: 30 day note review

## 2016-02-29 ENCOUNTER — Telehealth: Payer: Self-pay | Admitting: Respiratory Therapy

## 2016-02-29 ENCOUNTER — Encounter: Payer: Self-pay | Admitting: Respiratory Therapy

## 2016-02-29 DIAGNOSIS — J8410 Pulmonary fibrosis, unspecified: Secondary | ICD-10-CM

## 2016-03-01 NOTE — Telephone Encounter (Signed)
Called Mr Jeremiah Gibson to check on his cataract surgeries. His second eye will be operated on 03/21/16. Mr Jeremiah Gibson want to come back to [facility] when he is cleared for exercise.

## 2016-03-14 DIAGNOSIS — H2512 Age-related nuclear cataract, left eye: Secondary | ICD-10-CM | POA: Diagnosis not present

## 2016-03-15 ENCOUNTER — Encounter: Payer: Self-pay | Admitting: *Deleted

## 2016-03-16 ENCOUNTER — Telehealth: Payer: Self-pay | Admitting: Family Medicine

## 2016-03-16 DIAGNOSIS — Z79899 Other long term (current) drug therapy: Secondary | ICD-10-CM

## 2016-03-16 NOTE — Telephone Encounter (Signed)
Please advise patient it is time to check liver functions (hepatic function panel) as Dr. Vassie LollAlva requested. We can either order it here or he can see if Dr. Vassie LollAlva will order it. OK to leave order at front desk if he wants to come here. Thanks.

## 2016-03-16 NOTE — Telephone Encounter (Signed)
lmtcb Emily Drozdowski, CMA  

## 2016-03-16 NOTE — Telephone Encounter (Signed)
-----   Message from Malva Limesonald E Quintina Hakeem, MD sent at 02/29/2016  8:58 AM EST ----- Regarding: FW: repeat liver functions for Dr. Vassie LollAlva  Per Dr. Vassie LollAlva notes 01/14/2016 to check liver functions in January 2018  ----- Message ----- From: Malva Limesonald E Emmelyn Schmale, MD Sent: 02/22/2016 To: Malva Limesonald E Venancio Chenier, MD Subject: repeat liver functions for Dr. Vassie LollAlva 02-26-19#

## 2016-03-16 NOTE — Telephone Encounter (Signed)
Pt prefers to wait until his appointment on 03/29/2016 to have labs drawn. Allene DillonEmily Drozdowski, CMA

## 2016-03-18 DIAGNOSIS — E119 Type 2 diabetes mellitus without complications: Secondary | ICD-10-CM | POA: Diagnosis not present

## 2016-03-21 ENCOUNTER — Ambulatory Visit: Payer: Medicare Other | Admitting: Certified Registered Nurse Anesthetist

## 2016-03-21 ENCOUNTER — Ambulatory Visit
Admission: RE | Admit: 2016-03-21 | Discharge: 2016-03-21 | Disposition: A | Discharge status: Home / Self Care | Payer: Medicare Other | Source: Ambulatory Visit | Attending: Ophthalmology | Admitting: Ophthalmology

## 2016-03-21 ENCOUNTER — Encounter: Payer: Self-pay | Admitting: *Deleted

## 2016-03-21 ENCOUNTER — Encounter
Admission: RE | Disposition: A | Discharge status: Home / Self Care | Payer: Self-pay | Source: Ambulatory Visit | Attending: Ophthalmology

## 2016-03-21 DIAGNOSIS — J45909 Unspecified asthma, uncomplicated: Secondary | ICD-10-CM | POA: Insufficient documentation

## 2016-03-21 DIAGNOSIS — H2512 Age-related nuclear cataract, left eye: Secondary | ICD-10-CM | POA: Diagnosis not present

## 2016-03-21 DIAGNOSIS — I10 Essential (primary) hypertension: Secondary | ICD-10-CM | POA: Diagnosis not present

## 2016-03-21 DIAGNOSIS — M1990 Unspecified osteoarthritis, unspecified site: Secondary | ICD-10-CM | POA: Insufficient documentation

## 2016-03-21 DIAGNOSIS — Z79899 Other long term (current) drug therapy: Secondary | ICD-10-CM | POA: Insufficient documentation

## 2016-03-21 DIAGNOSIS — Z87891 Personal history of nicotine dependence: Secondary | ICD-10-CM | POA: Insufficient documentation

## 2016-03-21 DIAGNOSIS — D649 Anemia, unspecified: Secondary | ICD-10-CM | POA: Diagnosis not present

## 2016-03-21 DIAGNOSIS — N289 Disorder of kidney and ureter, unspecified: Secondary | ICD-10-CM | POA: Diagnosis not present

## 2016-03-21 DIAGNOSIS — J8410 Pulmonary fibrosis, unspecified: Secondary | ICD-10-CM | POA: Insufficient documentation

## 2016-03-21 DIAGNOSIS — Z7984 Long term (current) use of oral hypoglycemic drugs: Secondary | ICD-10-CM | POA: Diagnosis not present

## 2016-03-21 DIAGNOSIS — E669 Obesity, unspecified: Secondary | ICD-10-CM | POA: Diagnosis not present

## 2016-03-21 DIAGNOSIS — K219 Gastro-esophageal reflux disease without esophagitis: Secondary | ICD-10-CM | POA: Diagnosis not present

## 2016-03-21 DIAGNOSIS — Z6830 Body mass index (BMI) 30.0-30.9, adult: Secondary | ICD-10-CM | POA: Diagnosis not present

## 2016-03-21 DIAGNOSIS — E1136 Type 2 diabetes mellitus with diabetic cataract: Secondary | ICD-10-CM | POA: Insufficient documentation

## 2016-03-21 HISTORY — PX: CATARACT EXTRACTION W/PHACO: SHX586

## 2016-03-21 HISTORY — DX: Cardiac arrhythmia, unspecified: I49.9

## 2016-03-21 LAB — GLUCOSE, CAPILLARY: Glucose-Capillary: 168 mg/dL — ABNORMAL HIGH (ref 65–99)

## 2016-03-21 SURGERY — PHACOEMULSIFICATION, CATARACT, WITH IOL INSERTION
Anesthesia: Monitor Anesthesia Care | Site: Eye | Laterality: Left | Wound class: Clean

## 2016-03-21 MED ORDER — MIDAZOLAM HCL 2 MG/2ML IJ SOLN
INTRAMUSCULAR | Status: AC
  Filled 2016-03-21: qty 2

## 2016-03-21 MED ORDER — MOXIFLOXACIN HCL 0.5 % OP SOLN
1.0000 [drp] | OPHTHALMIC | Status: AC
  Administered 2016-03-21 (×3): 1 [drp] via OPHTHALMIC

## 2016-03-21 MED ORDER — FENTANYL CITRATE (PF) 100 MCG/2ML IJ SOLN
INTRAMUSCULAR | Status: DC | PRN
  Administered 2016-03-21 (×2): 50 ug via INTRAVENOUS

## 2016-03-21 MED ORDER — ARMC OPHTHALMIC DILATING DROPS
1.0000 "application " | OPHTHALMIC | Status: AC
  Administered 2016-03-21 (×3): 1 via OPHTHALMIC

## 2016-03-21 MED ORDER — LIDOCAINE HCL (PF) 4 % IJ SOLN
INTRAOCULAR | Status: DC | PRN
  Administered 2016-03-21: 4 mL via OPHTHALMIC

## 2016-03-21 MED ORDER — MIDAZOLAM HCL 2 MG/2ML IJ SOLN
INTRAMUSCULAR | Status: DC | PRN
  Administered 2016-03-21 (×2): 1 mg via INTRAVENOUS

## 2016-03-21 MED ORDER — NA CHONDROIT SULF-NA HYALURON 40-17 MG/ML IO SOLN
INTRAOCULAR | Status: DC | PRN
  Administered 2016-03-21: 1 mL via INTRAOCULAR

## 2016-03-21 MED ORDER — CARBACHOL 0.01 % IO SOLN
INTRAOCULAR | Status: DC | PRN
  Administered 2016-03-21: 0.5 mL via INTRAOCULAR

## 2016-03-21 MED ORDER — MOXIFLOXACIN HCL 0.5 % OP SOLN
OPHTHALMIC | Status: DC | PRN
  Administered 2016-03-21: 1 [drp] via OPHTHALMIC

## 2016-03-21 MED ORDER — SODIUM CHLORIDE 0.9 % IV SOLN
INTRAVENOUS | Status: DC
  Administered 2016-03-21: 11:00:00 via INTRAVENOUS

## 2016-03-21 MED ORDER — FENTANYL CITRATE (PF) 100 MCG/2ML IJ SOLN
INTRAMUSCULAR | Status: AC
  Filled 2016-03-21: qty 2

## 2016-03-21 MED ORDER — EPINEPHRINE PF 1 MG/ML IJ SOLN
INTRAMUSCULAR | Status: DC | PRN
  Administered 2016-03-21: 12:00:00 via OPHTHALMIC

## 2016-03-21 SURGICAL SUPPLY — 21 items
CANNULA ANT/CHMB 27GA (MISCELLANEOUS) ×2 IMPLANT
CUP MEDICINE 2OZ PLAST GRAD ST (MISCELLANEOUS) ×2 IMPLANT
GLOVE BIO SURGEON STRL SZ8 (GLOVE) ×2 IMPLANT
GLOVE BIOGEL M 6.5 STRL (GLOVE) ×2 IMPLANT
GLOVE SURG LX 8.0 MICRO (GLOVE) ×1
GLOVE SURG LX STRL 8.0 MICRO (GLOVE) ×1 IMPLANT
GOWN STRL REUS W/ TWL LRG LVL3 (GOWN DISPOSABLE) ×2 IMPLANT
GOWN STRL REUS W/TWL LRG LVL3 (GOWN DISPOSABLE) ×2
LENS IOL TECNIS ITEC 21.0 (Intraocular Lens) ×2 IMPLANT
PACK CATARACT (MISCELLANEOUS) ×2 IMPLANT
PACK CATARACT BRASINGTON LX (MISCELLANEOUS) ×2 IMPLANT
PACK EYE AFTER SURG (MISCELLANEOUS) ×2 IMPLANT
SOL BSS BAG (MISCELLANEOUS) ×2
SOL PREP PVP 2OZ (MISCELLANEOUS) ×2
SOLUTION BSS BAG (MISCELLANEOUS) ×1 IMPLANT
SOLUTION PREP PVP 2OZ (MISCELLANEOUS) ×1 IMPLANT
SYR 3ML LL SCALE MARK (SYRINGE) ×2 IMPLANT
SYR 5ML LL (SYRINGE) ×2 IMPLANT
SYR TB 1ML 27GX1/2 LL (SYRINGE) ×2 IMPLANT
WATER STERILE IRR 250ML POUR (IV SOLUTION) ×2 IMPLANT
WIPE NON LINTING 3.25X3.25 (MISCELLANEOUS) ×2 IMPLANT

## 2016-03-21 NOTE — Transfer of Care (Signed)
Immediate Anesthesia Transfer of Care Note  Patient: Keiyon Plack  Procedure(s) Performed: Procedure(s) with comments: CATARACT EXTRACTION PHACO AND INTRAOCULAR LENS PLACEMENT (IOC) (Left) - [place] 48.9 AP% 18.7 CDE 9.16 Fluid pack lot # [id number]  Patient Location: PACU  Anesthesia Type:MAC  Level of Consciousness: awake, alert  and oriented  Airway & Oxygen Therapy: Patient Spontanous Breathing  Post-op Assessment: Report given to RN and Post -op Vital signs reviewed and stable  Post vital signs: Reviewed and stable  Last Vitals:  Vitals:   03/21/16 1015 03/21/16 1156  BP: (!) 141/78 130/64  Pulse: 85 (!) 59  Resp: 16 16  Temp: 36.5 C (!) 35.7 C    Last Pain:  Vitals:   03/21/16 1156  TempSrc: Temporal         Complications: No apparent anesthesia complications

## 2016-03-21 NOTE — Anesthesia Preprocedure Evaluation (Signed)
Anesthesia Evaluation  Patient identified by MRN, date of birth, ID band Patient awake    Reviewed: Allergy & Precautions, NPO status , Patient's Chart, lab work & pertinent test results  History of Anesthesia Complications Negative for: history of anesthetic complications  Airway Mallampati: III  TM Distance: >3 FB Neck ROM: Full    Dental  (+) Upper Dentures, Lower Dentures   Pulmonary asthma , neg sleep apnea, former smoker,  Pulmonary fibrosis    breath sounds clear to auscultation- rhonchi (-) wheezing      Cardiovascular Exercise Tolerance: Good hypertension, Pt. on medications (-) angina+ Past MI   Rhythm:Regular Rate:Normal - Systolic murmurs and - Diastolic murmurs    Neuro/Psych negative neurological ROS  negative psych ROS   GI/Hepatic GERD  ,  Endo/Other  diabetes, Type 2, Oral Hypoglycemic Agents  Renal/GU Renal disease (diabetic nephropathy)     Musculoskeletal  (+) Arthritis ,   Abdominal (+) + obese,   Peds  Hematology  (+) anemia ,   Anesthesia Other Findings Past Medical History: No date: Anemia No date: Arthritis No date: Asthma No date: Colon polyps No date: Cough     Comment: CHRONIC No date: Diabetes mellitus without complication (HCC) No date: Diverticulosis of colon (without mention of he* No date: Dyspnea No date: Dysrhythmia No date: Esophageal reflux No date: Gout No date: Hypertension 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 No date: Leg cramps No date: Pulmonary fibrosis (HCC) No date: Wheezing   Reproductive/Obstetrics                             Anesthesia Physical Anesthesia Plan  ASA: III  Anesthesia Plan: MAC   Post-op Pain Management:    Induction: Intravenous  Airway Management Planned: Natural Airway  Additional Equipment:   Intra-op Plan:   Post-operative Plan:   Informed Consent: I have reviewed the patients History and  Physical, chart, labs and discussed the procedure including the risks, benefits and alternatives for the proposed anesthesia with the patient or authorized representative who has indicated his/her understanding and acceptance.     Plan Discussed with: CRNA and Anesthesiologist  Anesthesia Plan Comments:         Anesthesia Quick Evaluation

## 2016-03-21 NOTE — Anesthesia Postprocedure Evaluation (Signed)
Anesthesia Post Note  Patient: Jeremiah Gibson  Procedure(s) Performed: Procedure(s) (LRB): CATARACT EXTRACTION PHACO AND INTRAOCULAR LENS PLACEMENT (IOC) (Left)  Patient location during evaluation: PACU Anesthesia Type: MAC Level of consciousness: awake and alert and oriented Pain management: pain level controlled Vital Signs Assessment: post-procedure vital signs reviewed and stable Respiratory status: spontaneous breathing, nonlabored ventilation, respiratory function stable and patient connected to nasal cannula oxygen Cardiovascular status: stable and blood pressure returned to baseline Anesthetic complications: no     Last Vitals:  Vitals:   03/21/16 1156 03/21/16 1203  BP: 130/64 130/64  Pulse: (!) 59 65  Resp: 16 18  Temp: (!) 35.7 C (!) 35.7 C    Last Pain:  Vitals:   03/21/16 1203  TempSrc: Temporal                 Maryn Freelove

## 2016-03-21 NOTE — Discharge Instructions (Signed)

## 2016-03-21 NOTE — Op Note (Signed)
PREOPERATIVE DIAGNOSIS:  Nuclear sclerotic cataract of the left eye.   POSTOPERATIVE DIAGNOSIS:  Nuclear sclerotic cataract of the left eye.   OPERATIVE PROCEDURE: Procedure(s): CATARACT EXTRACTION PHACO AND INTRAOCULAR LENS PLACEMENT (IOC)   SURGEON:  Galen ManilaWilliam Mckinnon Glick, MD.   ANESTHESIA:  Anesthesiologist: Alver FisherAmy Penwarden, MD CRNA: Ginger CarneStephanie Michelet, CRNA  1.      Managed anesthesia care. 2.     0.571ml of Shugarcaine was instilled following the paracentesis   COMPLICATIONS:  None.   TECHNIQUE:   Stop and chop   DESCRIPTION OF PROCEDURE:  The patient was examined and consented in the preoperative holding area where the aforementioned topical anesthesia was applied to the left eye and then brought back to the Operating Room where the left eye was prepped and draped in the usual sterile ophthalmic fashion and a lid speculum was placed. A paracentesis was created with the side port blade and the anterior chamber was filled with viscoelastic. A near clear corneal incision was performed with the steel keratome. A continuous curvilinear capsulorrhexis was performed with a cystotome followed by the capsulorrhexis forceps. Hydrodissection and hydrodelineation were carried out with BSS on a blunt cannula. The lens was removed in a stop and chop  technique and the remaining cortical material was removed with the irrigation-aspiration handpiece. The capsular bag was inflated with viscoelastic and the Technis [id number] lens was placed in the capsular bag without complication. The remaining viscoelastic was removed from the eye with the irrigation-aspiration handpiece. The wounds were hydrated. The anterior chamber was flushed with Miostat and the eye was inflated to physiologic pressure. 0.51ml Vigamox was placed in the anterior chamber. The wounds were found to be water tight. The eye was dressed with Vigamox. The patient was given protective glasses to wear throughout the day and a shield with which to sleep  tonight. The patient was also given drops with which to begin a drop regimen today and will follow-up with me in one day.  Implant Name Type Inv. Item Serial No. Manufacturer Lot No. LRB No. Used  LENS IOL DIOP 21.0 - [id number] Intraocular Lens LENS IOL DIOP 21.0 [id number]   Left 1    Procedure(s) with comments: CATARACT EXTRACTION PHACO AND INTRAOCULAR LENS PLACEMENT (IOC) (Left) - US 48.9 AP% 18.7 CDE 9.16 Fluid pack lot # [id number]  Electronically signed: Sherrin Stahle L[place]IS 03/21/2016 11:52 AM

## 2016-03-21 NOTE — H&P (Signed)
All labs reviewed. Abnormal studies sent to patients PCP when indicated.  Previous H&P reviewed, patient examined, there are NO CHANGES.  Jeremiah Gibson LOUIS1/9/201811:23 AM

## 2016-03-21 NOTE — Anesthesia Procedure Notes (Signed)
Procedure Name: MAC Date/Time: 03/21/2016 11:30 AM Performed by: Johnna Acosta Pre-anesthesia Checklist: Patient identified, Emergency Drugs available, Suction available, Patient being monitored and Timeout performed

## 2016-03-24 ENCOUNTER — Encounter: Payer: Medicare Other | Attending: Pulmonary Disease | Admitting: *Deleted

## 2016-03-24 DIAGNOSIS — J849 Interstitial pulmonary disease, unspecified: Secondary | ICD-10-CM | POA: Insufficient documentation

## 2016-03-24 DIAGNOSIS — J4530 Mild persistent asthma, uncomplicated: Secondary | ICD-10-CM | POA: Insufficient documentation

## 2016-03-24 DIAGNOSIS — J8410 Pulmonary fibrosis, unspecified: Secondary | ICD-10-CM

## 2016-03-24 NOTE — Progress Notes (Signed)
Daily Session Note  Patient Details  Name: Jeremiah Gibson MRN: [id number] Date of Birth: 02/09/1936 Referring Provider:   April Manson Pulmonary Rehab from 11/23/2015 in [facility] Cardiac and Pulmonary Rehab  Referring Provider  [name]      Encounter Date: 03/24/2016  Check In:     Session Check In - 03/24/16 1203      Check-In   Location [facility]-Cardiac & Pulmonary Rehab   Staff Present Heath Lark, RN, BSN, CCRP;Jessica Luan [place], [place], ACSM RCEP, Exercise Physiologist;Tahj Lindseth RN BSN   Supervising physician immediately available to respond to emergencies [facility] immediately available ER MD   Physician(s) Drs. McShane & Kinner   Medication changes reported     No   Fall or balance concerns reported    No   Warm-up and Cool-down Performed as group-led Location manager Performed Yes   VAD Patient? No     Pain Assessment   Currently in Pain? No/denies   Multiple Pain Sites No         Goals Met:  Proper associated with RPD/PD & O2 Sat Independence with exercise equipment Using PLB without cueing & demonstrates good technique Exercise tolerated well No report of cardiac concerns or symptoms Strength training completed today  Goals Unmet:  Not Applicable  Comments: Pt able to follow exercise prescription today without complaint.  Will continue to monitor for progression.    Dr. Emily Filbert is Medical Director for [place] and [facility].

## 2016-03-27 ENCOUNTER — Encounter: Payer: Self-pay | Admitting: Respiratory Therapy

## 2016-03-27 DIAGNOSIS — J8410 Pulmonary fibrosis, unspecified: Secondary | ICD-10-CM

## 2016-03-27 DIAGNOSIS — J4530 Mild persistent asthma, uncomplicated: Secondary | ICD-10-CM | POA: Diagnosis not present

## 2016-03-27 DIAGNOSIS — J849 Interstitial pulmonary disease, unspecified: Secondary | ICD-10-CM | POA: Diagnosis not present

## 2016-03-27 NOTE — Progress Notes (Signed)
Daily Session Note  Patient Details  Name: Jeremiah Gibson MRN: [id number] Date of Birth: [birth year] Referring Provider:   Flowsheet Row Pulmonary Rehab from 11/23/2015 in [facility]  Referring Provider  [name]      Encounter Date: 03/27/2016  Check In:     Session Check In - 03/27/16 1211      Check-In   Location [facility]-Cardiac & Pulmonary Rehab   Staff Present Earlean Shawl, BS, ACSM CEP, Exercise Physiologist;Laureen Owens Shark, BS, RRT, Respiratory Dareen Piano, BA, ACSM CEP, Exercise Physiologist   Supervising physician immediately available to respond to emergencies [facility] immediately available ER MD   Physician(s) Marcelene Butte and Jimmye Norman   Medication changes reported     No   Fall or balance concerns reported    No   Warm-up and Cool-down Performed as group-led Location manager Performed Yes   VAD Patient? No     Pain Assessment   Currently in Pain? No/denies         Goals Met:  Proper associated with RPD/PD & O2 Sat Independence with exercise equipment Exercise tolerated well Strength training completed today  Goals Unmet:  Not Applicable  Comments: Pt able to follow exercise prescription today without complaint.  Will continue to monitor for progression.    Dr. Emily Filbert is Medical Director for [facility] and [facility].

## 2016-03-27 NOTE — Progress Notes (Signed)
Pulmonary Individual Treatment Plan  Patient Details  Name: Jeremiah Gibson MRN: [id number] Date of Birth: January 11, 1936 Referring Provider:   Flowsheet Row Pulmonary Rehab from 11/23/2015 in [facility] Cardiac and Pulmonary Rehab  Referring Provider  [name]      Initial Encounter Date:  Flowsheet Row Pulmonary Rehab from 11/23/2015 in [facility] Cardiac and Pulmonary Rehab  Date  11/23/15  Referring Provider  [name]      Visit Diagnosis: Pulmonary fibrosis ([place])  Patient's Home Medications on Admission:  Current Outpatient Prescriptions:    albuterol (VENTOLIN HFA) 108 (90 Base) MCG/ACT inhaler, Inhale 2 puffs into the lungs every 4 (four) hours as needed for wheezing or shortness of breath., Disp: 3 Inhaler, Rfl: 3   allopurinol (ZYLOPRIM) 100 MG tablet, Take 2 tablets (200 mg total) by mouth 2 (two) times daily., Disp: 360 tablet, Rfl: 3   amLODipine (NORVASC) 5 MG tablet, Take 1 tablet by mouth  every day for blood  pressure, Disp: 90 tablet, Rfl: 3   aspirin 81 MG tablet, Take 81 mg by mouth every evening. , Disp: , Rfl:    finasteride (PROSCAR) 5 MG tablet, Take 1 tablet by mouth  every day, Disp: 90 tablet, Rfl: 3   fish oil-omega-3 fatty acids 1000 MG capsule, Take 1 g by mouth 2 (two) times daily. , Disp: , Rfl:    Fluticasone-Salmeterol (ADVAIR DISKUS) 250-50 MCG/DOSE AEPB, Inhale 1 puff into the lungs 2 (two) times daily., Disp: 14 each, Rfl: 0   glipiZIDE (GLUCOTROL XL) 10 MG 24 hr tablet, Take 1 tablet by mouth  daily with food for  diabetes; take with largest meal of the day, Disp: 90 tablet, Rfl: 3   losartan (COZAAR) 100 MG tablet, Take 1 tablet by mouth  every day for blood  pressure, Disp: 90 tablet, Rfl: 3   Magnesium 400 MG TABS, Take 400 mg by mouth 2 (two) times daily. , Disp: , Rfl:    metFORMIN (GLUCOPHAGE) 500 MG tablet, Take 2 tablets (1,000 mg total) by mouth 2 (two) times daily with a meal., Disp: 360 tablet, Rfl: 4   MULTIPLE VITAMIN PO, MULTIVITAMINS (Oral Tablet)   1 Every Day for 0 days  Quantity: 0.00;  Refills: 0   Ordered :06-Jan-2010  Doy Hutching ;  Started 22-Mar-2009 Active, Disp: , Rfl:    Nintedanib (OFEV) 100 MG CAPS, Take 100 mg by mouth 2 (two) times daily with a meal. , Disp: , Rfl:    pantoprazole (PROTONIX) 20 MG tablet, Take 20 mg by mouth 2 (two) times daily., Disp: , Rfl:    potassium chloride SA (K-DUR,KLOR-CON) 20 MEQ tablet, Take 1 tablet by mouth  daily, Disp: 90 tablet, Rfl: 3   simvastatin (ZOCOR) 20 MG tablet, Take 1 tablet (20 mg total) by mouth daily. (Patient taking differently: Take 20 mg by mouth daily at 6 PM. ), Disp: 90 tablet, Rfl: 0   terazosin (HYTRIN) 5 MG capsule, Take 1 capsule by mouth  every day for prostate, Disp: 90 capsule, Rfl: 3   triamterene-hydrochlorothiazide (MAXZIDE-25) 37.5-25 MG tablet, Take 1 tablet by mouth  Every Day for blood  pressure, Disp: 90 tablet, Rfl: 4  Past Medical History: Past Medical History:  Diagnosis Date   Anemia    Arthritis    Asthma    Colon polyps    Cough    CHRONIC   Diabetes mellitus without complication (HCC)    Diverticulosis of colon (without mention of hemorrhage)    Dyspnea  Dysrhythmia    Esophageal reflux    Gout    Hypertension    Impotence of organic origin    Leg cramps    Pulmonary fibrosis (HCC)    Wheezing     Tobacco Use: History  Smoking Status   Former Smoker   Packs/day: 2.00   Years: 20.00   Types: Cigarettes, Pipe   Quit date: 11/22/1999  Smokeless Tobacco   Never Used    Comment: quit cigs in 72 and pipe in 01    Labs: Recent Review Flowsheet Data    Labs for ITP Cardiac and Pulmonary Rehab Latest Ref Rng & Units 11/04/2014 02/09/2015 05/11/2015 08/10/2015 12/28/2015   Cholestrol 100 - 199 mg/dL - - 177 - 158   LDLCALC 0 - 99 mg/dL - - 99 - 91   HDL >39 mg/dL - - 42 - 39(L)   Trlycerides 0 - 149 mg/dL - - 178(H) - 139   Hemoglobin A1c 4.8 - 5.6 % 7.7 7.9 7.8 7.9 8.6(H)       ADL UCSD:      Pulmonary Assessment Scores    Row Name 11/23/15 1311 01/05/16 1512 03/24/16 1313     ADL UCSD   ADL Phase Entry Mid Exit   SOB Score total 8 32 25   Rest 0 0 0   Walk 0 1 1   Stairs _0 Bath 0 1 0   Dress 0 2 1   Shop 0 1 1      Pulmonary Function Assessment:     Pulmonary Function Assessment - 11/23/15 1310      Initial Spirometry Results   FVC% 86 %   FEV1% 98 %   FEV1/FVC Ratio 82   Comments Test date 07/20/15     Post Bronchodilator Spirometry Results   FVC% 81 %   FEV1% 93 %   FEV1/FVC Ratio 82     Breath   Bilateral Breath Sounds Basilar;Rales   Shortness of Breath Yes      Exercise Target Goals:    Exercise Program Goal: Individual exercise prescription set with THRR, safety & activity barriers. Participant demonstrates ability to understand and report RPE using BORG scale, to self-measure pulse accurately, and to acknowledge the importance of the exercise prescription.  Exercise Prescription Goal: Starting with aerobic activity 30 plus minutes a day, 3 days per week for initial exercise prescription. Provide home exercise prescription and guidelines that participant acknowledges understanding prior to discharge.  Activity Barriers & Risk Stratification:     Activity Barriers & Cardiac Risk Stratification - 11/23/15 1309      Activity Barriers & Cardiac Risk Stratification   Activity Barriers Shortness of Breath;Deconditioning   Cardiac Risk Stratification Moderate      6 Minute Walk:     6 Minute Walk    Row Name 11/23/15 1236 01/05/16 1241 02/02/16 1049     6 Minute Walk   Phase  -- Mid Program Discharge   Distance 1320 feet 1425 feet 1476 feet   Distance % Change  -- 7.5 % 11.8 %   Walk Time 6 minutes 6 minutes 6 minutes   # of Rest Breaks  -- 0 0   MPH 2.5 2.69 2.79   METS 2.49 2.82 2.88   RPE _1 Perceived Dyspnea  _2 VO2 Peak 8.7 9.8 10.1   Symptoms No No No   Resting HR 85 bpm 88 bpm 90 bpm  Resting BP 124/76  126/74 124/64   Max Ex. HR 105 bpm 113 bpm 126 bpm   Max Ex. BP 144/64 152/56 132/74   2 Minute Post BP  --  -- 134/70     Interval HR   Baseline HR 85 88 90   1 Minute HR 92 104 102   2 Minute HR 93 107  --   3 Minute HR 102  -- 124   4 Minute HR 103 113  --   5 Minute HR 103 112 126   6 Minute HR 105 112 113   2 Minute Post HR 96 97  --   Interval Heart Rate? Yes Yes Yes     Interval Oxygen   Interval Oxygen? Yes Yes  --   Baseline Oxygen Saturation % 97 % 94 % 94 %   1 Minute Oxygen Saturation % 93 % 94 % 94 %   2 Minute Oxygen Saturation % 88 % 94 % 94 %   3 Minute Oxygen Saturation % 89 %  -- 94 %   4 Minute Oxygen Saturation % 90 % 94 % 94 %   5 Minute Oxygen Saturation % 91 % 94 % 94 %   6 Minute Oxygen Saturation % 93 % 94 % 94 %   2 Minute Post Oxygen Saturation % 96 % 94 % 96 %      Initial Exercise Prescription:     Initial Exercise Prescription - 11/23/15 1200      Date of Initial Exercise RX and Referring Provider   Date 11/23/15   Referring Provider [name]     Treadmill   MPH 1.7   Grade 0.5   Minutes 15   METs 2.42     Recumbant Bike   Level 1   RPM 60   Minutes 15   METs 2.3     NuStep   Level 2   Minutes 15   METs 2     T5 Nustep   Level 1   Minutes 15   METs 2     Biostep-RELP   Level 2   Minutes 15   METs 2     Prescription Details   Frequency (times per week) 3   Duration Progress to 45 minutes of aerobic exercise without signs/symptoms of physical distress     Intensity   THRR 40-80% of Max Heartrate 107-130   Ratings of Perceived Exertion 11-13   Perceived Dyspnea 0-4     Progression   Progression Continue to progress workloads to maintain intensity without signs/symptoms of physical distress.     Resistance Training   Training Prescription Yes   Weight 2   Reps 10-15      Perform Capillary Blood Glucose checks as needed.  Exercise Prescription Changes:     Exercise Prescription Changes    Row Name 11/29/15  1400 12/08/15 1300 12/22/15 1500 01/05/16 1400 01/18/16 1400     Exercise Review   Progression  -- Yes Yes Yes Yes     Response to Exercise   Blood Pressure (Admit) 120/70 120/62 136/76 126/74 126/74   Blood Pressure (Exercise) 134/82 148/82 126/64 152/86 128/66   Blood Pressure (Exit) 138/64 134/78 116/64 126/66 144/82   Heart Rate (Admit) 76 bpm 94 bpm 95 bpm 94 bpm 82 bpm   Heart Rate (Exercise) 98 bpm 94 bpm 115 bpm 95 bpm 83 bpm   Heart Rate (Exit) 74 bpm 88 bpm 86 bpm 98 bpm 78 bpm  Oxygen Saturation (Admit) 97 % 95 % 97 % 88 % 97 %   Oxygen Saturation (Exercise) 97 % 95 % 90 % 96 % 96 %   Oxygen Saturation (Exit) 98 % 96 % 97 % 98 % 97 %   Rating of Perceived Exertion (Exercise) _0 Perceived Dyspnea (Exercise) _1 Symptoms _2    Duration Progress to 45 minutes of aerobic exercise without signs/symptoms of physical distress Progress to 45 minutes of aerobic exercise without signs/symptoms of physical distress Progress to 45 minutes of aerobic exercise without signs/symptoms of physical distress Progress to 45 minutes of aerobic exercise without signs/symptoms of physical distress Progress to 45 minutes of aerobic exercise without signs/symptoms of physical distress   Intensity _3      Progression   Progression Continue to progress workloads to maintain intensity without signs/symptoms of physical distress. Continue to progress workloads to maintain intensity without signs/symptoms of physical distress. Continue to progress workloads to maintain intensity without signs/symptoms of physical distress. Continue to progress workloads to maintain intensity without signs/symptoms of physical distress. Continue to progress workloads to maintain intensity without signs/symptoms of physical distress.   Average METs  --  -- 2.05 2.2 2     Resistance Training   Training Prescription  _4    Weight _5 lbs 4 lbs 4 lbs   Reps 10-15 10-15 10-12 10-12 10-12     Interval Training   Interval Training _6      Treadmill   MPH 1  --  --  --  --   Grade 0.5  --  --  --  --   Minutes 10  5/5  --  --  --  --     Arm/Foot Ergometer   Level  -- _7 4.5   Watts  --  --  -- --  50 rpm  --   Minutes  -- _8 T5 Nustep   Level _9 80 spm 5   Minutes _10 METs  -- 2 2.1 2.3 2     Biostep-RELP   Level _11 40 spm 6   Minutes _12 METs  -- _13 Row Name 01/19/16 1500 02/01/16 1500           Exercise Review   Progression Yes Yes        Response to Exercise   Blood Pressure (Admit)  -- 116/70      Blood Pressure (Exercise)  -- 148/68      Blood Pressure (Exit)  -- 124/70      Heart Rate (Admit)  -- 90 bpm      Heart Rate (Exercise)  -- 92 bpm      Heart Rate (Exit)  -- 84 bpm      Oxygen Saturation (Admit)  -- 96 %      Oxygen Saturation (Exercise)  -- 96 %      Oxygen Saturation (Exit)  -- 97 %      Rating of Perceived Exertion (Exercise)  -- 16      Perceived Dyspnea (Exercise)  -- 4      Symptoms none none  Comments Home Exercise Guidelines given 01/19/16 Home Exercise Guidelines given 01/19/16      Duration Progress to 45 minutes of aerobic exercise without signs/symptoms of physical distress Progress to 45 minutes of aerobic exercise without signs/symptoms of physical distress      Intensity THRR unchanged THRR unchanged        Progression   Progression Continue to progress workloads to maintain intensity without signs/symptoms of physical distress. Continue to progress workloads to maintain intensity without signs/symptoms of physical distress.      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 Add 2 additional days to program exercise sessions.         Exercise Comments:     Exercise Comments    Row Name 11/29/15 1503 12/08/15 1313 12/22/15 1554 01/05/16 1450 01/18/16 1416   Exercise Comments Mr Dolinger did well his first day of exercise.  Exercise equipment safety, infection control, and diabetes, high and low blood sugar signs were reveiwed. Esa is progressing well with exercise. Caldwell is making good progress in rehab.  He is now up to level 4 on T5 and BioStep.  We wll continue to monitor his progression. Starsky completed his mid 6 min walk test today.  He has already impoved by 105 ft (7.5%)!!  We will continue to monitor his progress. Jeanpierre continues to do well with exercise. He is already over half way through the program.  We will continue to monitor his progression.   [place] Name 01/19/16 1548 02/01/16 1541 03/01/16 1531 03/24/16 1205     Exercise Comments Reviewed home exercise with pt today.  Pt plans to walk and go to [facility] with his [employer] for exercise.  Reviewed THR, pulse, RPE, sign and symptoms, and when to call 911 or MD.  Also discussed weather considerations and indoor options.  Pt voiced understanding.    Rod continues to do well.  We will continue to watch for progression. Oluwaferanmi has continued to do well in rehab.  We will continue to monitor his progression. Author has not attended since last review due to cataract surgery.  Will resume when cleared. Sevin brought in his physician clearance to return to [place].   He may exercise (bike/walk).  NO lifting weights or "floor work" until further check up and notice.        Discharge Exercise Prescription (Final Exercise Prescription Changes):     Exercise Prescription  Changes - 02/01/16 1500      Exercise Review   Progression Yes     Response to Exercise   Blood Pressure (Admit) 116/70   Blood Pressure (Exercise) 148/68   Blood Pressure (Exit) 124/70   Heart Rate (Admit) 90 bpm   Heart Rate (Exercise) 92 bpm   Heart Rate (Exit) 84 bpm   Oxygen Saturation (Admit) 96 %   Oxygen Saturation (Exercise) 96 %   Oxygen Saturation (Exit) 97 %   Rating of Perceived Exertion (Exercise) 16   Perceived Dyspnea (Exercise) 4   Symptoms none   Comments Home Exercise Guidelines given 01/19/16   Duration Progress to 45 minutes of aerobic exercise without signs/symptoms of physical distress   Intensity THRR unchanged     Progression   Progression Continue to progress workloads to maintain intensity without signs/symptoms of physical distress.   Average METs 2.1     Resistance Training   Training Prescription Yes   Weight 4 lbs   Reps 10-12     Interval Training   Interval Training No     Arm/Foot Ergometer   Level 4.5   Watts --  40 rpm   Minutes 15     T5 Nustep   Level 5   Minutes 15   METs 2.2     Biostep-RELP   Level 6   Minutes 15   METs 2     Home Exercise Plan   Plans to continue exercise at [employer] (comment)  walking and [employer] ([employer])   Frequency Add 2 additional days to program exercise sessions.       Nutrition:  Target Goals: Understanding of nutrition guidelines, daily intake of sodium <1579m, cholesterol <2065m calories 30% from fat and 7% or less from saturated fats, daily to have 5 or more servings of fruits and vegetables.  Biometrics:     Pre Biometrics - 11/23/15 1234      Pre Biometrics   Height _0  (1.778 m)   Weight 214 lb 8 oz (97.3 kg)   Waist Circumference 44 inches   Hip  Circumference 43 inches   Waist to Hip Ratio 1.02 %   BMI (Calculated) 30.8         Post Biometrics - 02/02/16 1048       Post  Biometrics   Height _1  (1.778 m)   Weight 213 lb (96.6 kg)   Waist Circumference 43 inches   Hip Circumference 43 inches   Waist to Hip Ratio 1 %   BMI (Calculated) 30.6      Nutrition Therapy Plan and Nutrition Goals:   Nutrition Discharge: Rate Your Plate Scores:   Psychosocial: Target Goals: Acknowledge presence or absence of depression, maximize coping skills, provide positive support system. Participant is able to verbalize types and ability to use techniques and skills needed for reducing stress and depression.  Initial Review & Psychosocial Screening:     Initial Psych Review & Screening - 11/23/15 [id number]   Comments Mr BrProffitas great support from his wife. He has accepted his pulmonary fibrosis and is very positive about managing the disease.     Barriers   Psychosocial barriers to participate in program The patient should benefit from training in stress management and relaxation.     Screening Interventions   Interventions Encouraged to exercise      Quality of Life Scores:     Quality  of Life - 03/24/16 1228      Quality of Life Scores   Health/Function Pre 20.19 %   Health/Function Post 19.93 %   Health/Function % Change -1.29 %   Socioeconomic Pre 20.4 %   Socioeconomic Post 20.44 %   Socioeconomic % Change  0.2 %   Psych/Spiritual Pre 20.29 %   Psych/Spiritual Post 28.79 %   Psych/Spiritual % Change 41.89 %   Family Pre 20 %   Family Post 22.9 %   Family % Change 14.5 %   GLOBAL Pre 20.21 %   GLOBAL Post 22.24 %   GLOBAL % Change 10.04 %      PHQ-9: Recent Review Flowsheet Data    Depression screen [facility] 2/9 12/28/2015 11/23/2015   Decreased Interest 0 0   Down, Depressed, Hopeless 0 0   PHQ - 2 Score 0 0   Altered sleeping - 0   Tired, decreased energy -  0   Change in appetite - 0   Feeling bad or failure about yourself  - 0   Trouble concentrating - 0   Moving slowly or fidgety/restless - 0   Suicidal thoughts - 0   PHQ-9 Score - 0      Psychosocial Evaluation and Intervention:     Psychosocial Evaluation - 12/01/15 1131      Psychosocial Evaluation & Interventions   Interventions Encouraged to exercise with the program and follow exercise prescription   Comments Counselor met with Mr. Zaccone today for initial psychosocial evaluation.  He is a 81 year old who has been recently diagnosed with pulmonary fibrosis.  He has a strong support system with a spouse of 34 years; friends close by & active involvement in his local church community.  Mr. Vaile has other health issues that are challenging in addition to the pulmonary fibrosis; he has diabetes; high cholesterol and high blood pressure.  He states he sleeps well and has a good appetite.  He denies a history or current symptoms of depresssion or anxiety and is typically in a positive mood.  Mr. Weakland reports minimal stress in his life other than his health issues.  He has goals to breathe better while in this program and plans to continue exercising with his silver sneakers class upon completion in this pulmonary rehab class.        Psychosocial Re-Evaluation:     Psychosocial Re-Evaluation    [place] Name 01/26/16 1147 02/29/16 1523           Psychosocial Re-Evaluation   Comments Counselor follow up with Mr. B today reporting he is feeling stronger and his legs are especially stronger since coming into this program.  He is sleeping well and continues to have a good appetite.  He states his breathing is "about the same."  Mr. B reports the stress in his life is manageable and he continues to have a positive attitude and enjoys coming to these classes.   Mr Kossman last attended 02/02/16 due to cataract surgery. I called him today, and he reports the first eye surgery went well and the  second eye will be operated on 03/21/16. He is in very good spirits and is looking forward to returning to [place]..        Education: Education Goals: Education classes will be provided on a weekly basis, covering required topics. Participant will state understanding/return demonstration of topics presented.  Learning Barriers/Preferences:     Learning Barriers/Preferences - 11/23/15 1310  Learning Barriers/Preferences   Learning Barriers None   Learning Preferences Group Instruction;Individual Instruction;Pictoral;Skilled Demonstration;Verbal Instruction;Video;Written Material      Education Topics: Initial Evaluation Education: - Verbal, written and demonstration of respiratory meds, RPE/PD scales, oximetry and breathing techniques. Instruction on use of nebulizers and MDIs: cleaning and proper use, rinsing mouth with steroid doses and importance of monitoring MDI activations. Flowsheet Row Pulmonary Rehab from 01/31/2016 in [facility] Cardiac and Pulmonary Rehab  Date  11/23/15  Educator  LB  Instruction Review Code  2- meets goals/outcomes      General Nutrition Guidelines/Fats and Fiber: -Group instruction provided by verbal, written material, models and posters to present the general guidelines for heart healthy nutrition. Gives an explanation and review of dietary fats and fiber. Flowsheet Row Pulmonary Rehab from 01/31/2016 in [facility] Cardiac and Pulmonary Rehab  Date  01/31/16  Educator  CR  Instruction Review Code  2- meets goals/outcomes      Controlling Sodium/Reading Food Labels: -Group verbal and written material supporting the discussion of sodium use in heart healthy nutrition. Review and explanation with models, verbal and written materials for utilization of the food label.   Exercise Physiology & Risk Factors: - Group verbal and written instruction with models to review the exercise physiology of the cardiovascular system and associated critical values. Details  cardiovascular disease risk factors and the goals associated with each risk factor. Flowsheet Row Pulmonary Rehab from 01/31/2016 in [facility] Cardiac and Pulmonary Rehab  Date  01/26/16  Educator  AS  Instruction Review Code  2- meets goals/outcomes      Aerobic Exercise & Resistance Training: - Gives group verbal and written discussion on the health impact of inactivity. On the components of aerobic and resistive training programs and the benefits of this training and how to safely progress through these programs. Flowsheet Row Pulmonary Rehab from 01/31/2016 in [facility] Cardiac and Pulmonary Rehab  Date  12/01/15  Educator  Nada Maclachlan  Instruction Review Code  2- meets goals/outcomes      Flexibility, Balance, General Exercise Guidelines: - Provides group verbal and written instruction on the benefits of flexibility and balance training programs. Provides general exercise guidelines with specific guidelines to those with heart or lung disease. Demonstration and skill practice provided.   Stress Management: - Provides group verbal and written instruction about the health risks of elevated stress, cause of high stress, and healthy ways to reduce stress.   Depression: - Provides group verbal and written instruction on the correlation between heart/lung disease and depressed mood, treatment options, and the stigmas associated with seeking treatment.   Exercise & Equipment Safety: - Individual verbal instruction and demonstration of equipment use and safety with use of the equipment. Flowsheet Row Pulmonary Rehab from 01/31/2016 in [facility] Cardiac and Pulmonary Rehab  Date  11/29/15  Educator  AS  Instruction Review Code  2- meets goals/outcomes      Infection Prevention: - Provides verbal and written material to individual with discussion of infection control including proper hand washing and proper equipment cleaning during exercise session. Flowsheet Row Pulmonary Rehab from  01/31/2016 in [facility] Cardiac and Pulmonary Rehab  Date  11/29/15  Educator  AS  Instruction Review Code  2- meets goals/outcomes      Falls Prevention: - Provides verbal and written material to individual with discussion of falls prevention and safety. Flowsheet Row Pulmonary Rehab from 01/31/2016 in [facility] Cardiac and Pulmonary Rehab  Date  11/23/15  Educator  LB  Instruction Review Code  2- meets goals/outcomes      Diabetes: - Individual verbal and written instruction to review signs/symptoms of diabetes, desired ranges of glucose level fasting, after meals and with exercise. Advice that pre and post exercise glucose checks will be done for 3 sessions at entry of program. Flowsheet Row Pulmonary Rehab from 01/31/2016 in [facility] Cardiac and Pulmonary Rehab  Date  11/29/15  Educator  AS  Instruction Review Code  2- meets goals/outcomes      Chronic Lung Diseases: - Group verbal and written instruction to review new updates, new respiratory medications, new advancements in procedures and treatments. Provide informative websites and "800" numbers of self-education. Flowsheet Row Pulmonary Rehab from 01/31/2016 in [facility] Cardiac and Pulmonary Rehab  Date  12/27/15  Educator  LB  Instruction Review Code  2- meets goals/outcomes      Lung Procedures: - Group verbal and written instruction to describe testing methods done to diagnose lung disease. Review the outcome of test results. Describe the treatment choices: Pulmonary Function Tests, ABGs and oximetry.   Energy Conservation: - Provide group verbal and written instruction for methods to conserve energy, plan and organize activities. Instruct on pacing techniques, use of adaptive equipment and posture/positioning to relieve shortness of breath.   Triggers: - Group verbal and written instruction to review types of environmental controls: home humidity, furnaces, filters, dust mite/pet prevention, HEPA vacuums. To discuss weather  changes, air quality and the benefits of nasal washing.   Exacerbations: - Group verbal and written instruction to provide: warning signs, infection symptoms, calling MD promptly, preventive modes, and value of vaccinations. Review: effective airway clearance, coughing and/or vibration techniques. Create an Sports administrator.   Oxygen: - Individual and group verbal and written instruction on oxygen therapy. Includes supplement oxygen, available portable oxygen systems, continuous and intermittent flow rates, oxygen safety, concentrators, and Medicare reimbursement for oxygen.   Respiratory Medications: - Group verbal and written instruction to review medications for lung disease. Drug class, frequency, complications, importance of spacers, rinsing mouth after steroid MDI's, and proper cleaning methods for nebulizers. Flowsheet Row Pulmonary Rehab from 01/31/2016 in [facility] Cardiac and Pulmonary Rehab  Date  11/23/15  Educator  LB  Instruction Review Code  2- meets goals/outcomes      AED/CPR: - Group verbal and written instruction with the use of models to demonstrate the basic use of the AED with the basic ABC's of resuscitation. Flowsheet Row Pulmonary Rehab from 01/31/2016 in [facility] Cardiac and Pulmonary Rehab  Date  01/21/16  Educator  CE  Instruction Review Code  2- meets goals/outcomes      Breathing Retraining: - Provides individuals verbal and written instruction on purpose, frequency, and proper technique of diaphragmatic breathing and pursed-lipped breathing. Applies individual practice skills. Flowsheet Row Pulmonary Rehab from 01/31/2016 in [facility] Cardiac and Pulmonary Rehab  Date  11/23/15  Educator  LB  Instruction Review Code  2- meets goals/outcomes      Anatomy and Physiology of the Lungs: - Group verbal and written instruction with the use of models to provide basic lung anatomy and physiology related to function, structure and complications of lung disease.   Heart  Failure: - Group verbal and written instruction on the basics of heart failure: signs/symptoms, treatments, explanation of ejection fraction, enlarged heart and cardiomyopathy.   Sleep Apnea: - Individual verbal and written instruction to review Obstructive Sleep Apnea. Review of risk factors, methods for diagnosing and types of masks and machines for OSA.   Anxiety: - Provides group,  verbal and written instruction on the correlation between heart/lung disease and anxiety, treatment options, and management of anxiety. Flowsheet Row Pulmonary Rehab from 01/31/2016 in [facility] Cardiac and Pulmonary Rehab  Date  12/15/15  Educator  [facility]  Instruction Review Code  2- Meets goals/outcomes      Relaxation: - Provides group, verbal and written instruction about the benefits of relaxation for patients with heart/lung disease. Also provides patients with examples of relaxation techniques. Flowsheet Row Pulmonary Rehab from 01/31/2016 in [facility] Cardiac and Pulmonary Rehab  Date  01/12/16  Educator  [facility] [employer]  Instruction Review Code  2- Meets goals/outcomes      Knowledge Questionnaire Score:     Knowledge Questionnaire Score - 03/24/16 1230      Knowledge Questionnaire Score   Post Score 9/10       Core Components/Risk Factors/Patient Goals at Admission:     Personal Goals and Risk Factors at Admission - 11/23/15 1319      Core Components/Risk Factors/Patient Goals on Admission    Weight Management Yes   Intervention Weight Management: Develop a combined nutrition and exercise program designed to reach desired caloric intake, while maintaining appropriate intake of nutrient and fiber, sodium and fats, and appropriate energy expenditure required for the weight goal.  Mr Golla prefers not to meet with the dietitian. His wife is a good , healthy cook. He does like bread, but not sweets.   Admit Weight 214 lb 6.4 oz (97.3 kg)   Goal Weight: Short Term 209 lb (94.8 kg)   Goal Weight: Long Term 204  lb (92.5 kg)   Expected Outcomes Understanding of distribution of calorie intake throughout the day with the consumption of 4-5 meals/snacks   Sedentary Yes   Intervention Provide advice, education, support and counseling about physical activity/exercise needs.;Develop an individualized exercise prescription for aerobic and resistive training based on initial evaluation findings, risk stratification, comorbidities and participant's personal goals.  He does the Silver Sneakers class twice a week..   Expected Outcomes Achievement of increased cardiorespiratory fitness and enhanced flexibility, muscular endurance and strength shown through measurements of functional capacity and personal statement of participant.   Increase Strength and Stamina Yes   Intervention Provide advice, education, support and counseling about physical activity/exercise needs.;Develop an individualized exercise prescription for aerobic and resistive training based on initial evaluation findings, risk stratification, comorbidities and participant's personal goals.   Expected Outcomes Achievement of increased cardiorespiratory fitness and enhanced flexibility, muscular endurance and strength shown through measurements of functional capacity and personal statement of participant.   Improve shortness of breath with ADL's Yes   Intervention Provide education, individualized exercise plan and daily activity instruction to help decrease symptoms of SOB with activities of daily living.   Expected Outcomes Short Term: Achieves a reduction of symptoms when performing activities of daily living.   Develop more efficient breathing techniques such as purse lipped breathing and diaphragmatic breathing; and practicing self-pacing with activity Yes   Intervention Provide education, demonstration and support about specific breathing techniuqes utilized for more efficient breathing. Include techniques such as pursed lipped breathing, diaphragmatic  breathing and self-pacing activity.   Expected Outcomes Short Term: Participant will be able to demonstrate and use breathing techniques as needed throughout daily activities.   Increase knowledge of respiratory medications and ability to use respiratory devices properly  Yes   Intervention Provide education and demonstration as needed of appropriate use of medications, inhalers, and oxygen therapy.  Advair and Proventil; spacer given   Expected Outcomes  Short Term: Achieves understanding of medications use. Understands that oxygen is a medication prescribed by physician. Demonstrates appropriate use of inhaler and oxygen therapy.   Diabetes Yes   Intervention Provide education about signs/symptoms and action to take for hypo/hyperglycemia.;Provide education about proper nutrition, including hydration, and aerobic/resistive exercise prescription along with prescribed medications to achieve blood glucose in normal ranges: Fasting glucose 65-99 mg/dL   Expected Outcomes Short Term: Participant verbalizes understanding of the signs/symptoms and immediate care of hyper/hypoglycemia, proper foot care and importance of medication, aerobic/resistive exercise and nutrition plan for blood glucose control.;Long Term: Attainment of HbA1C < 7%.   Hypertension Yes   Intervention Provide education on lifestyle modifcations including regular physical activity/exercise, weight management, moderate sodium restriction and increased consumption of fresh fruit, vegetables, and low fat dairy, alcohol moderation, and smoking cessation.;Monitor prescription use compliance.   Expected Outcomes Short Term: Continued assessment and intervention until BP is < 140/59m HG in hypertensive participants. < 130/826mHG in hypertensive participants with diabetes, heart failure or chronic kidney disease.;Long Term: Maintenance of blood pressure at goal levels.      Core Components/Risk Factors/Patient Goals Review:      Goals and  Risk Factor Review    Row Name 12/01/15 1243 12/10/15 1250 12/10/15 1252 12/10/15 1253 12/29/15 1157     Core Components/Risk Factors/Patient Goals Review   Personal Goals Review Weight Management/Obesity;Sedentary;Increase Strength and Stamina;Hypertension;Diabetes Increase knowledge of respiratory medications and ability to use respiratory devices properly. Improve shortness of breath with ADL's Develop more efficient breathing techniques such as purse lipped breathing and diaphragmatic breathing and practicing self-pacing with activity. Weight Management/Obesity;Sedentary;Diabetes;Hypertension   Review ViMaribelad a good first day of exercise.  He was doing silver sneakers before, but not while in program.  His blood pressure was good here, but he does not check it at home.  His blood sugars have been good at home and here. He was down 1/2 lb today! Mr. BrPortnerakes an Albuterol MDI and Advair at home for his breathing.  He has a spacer for the albuterol and knows to rinse his mouth after taking the Advair. Mr. BrJeffas not seen any improventment in his SOB.  He is only on his seventh session. Mr. BrDanneremonstrates proper technique for PLB. ViDagoas not lost weight but is maintaining his current weight.  He consistently attends class 3 days per week and has not yet missed a session.  He never had trouble with ADLs excpet walking up hills or stairs.  Bp has been within normal limits when he comes to class.. Marland KitchenHis a1c level was higher yesterday at the Dr but he feels it may be due to a new medication he takes for pukmonary fibrosis.  He will follow up with his Dr about this.   Expected Outcomes ViDonleyill continue to come to exercise and education classes for risk factor modification.  We continue to monitor for progression. Taking his respiratory medications correctly will help reduces SOB and exacerbations.  Continued cardio and strength training should start to help Mr. BrAnfinsonmprove on his SOB  and make ADLs easier for him. PLB will help Mr. BrThalegain his breath during exercise and episodes of SOB.  ViLundyill maintain his attendance and see his weight stay the same or go down.  His other vitals - BP and BG will remain well controlled.   RoSpencerame 01/05/16 1515 01/17/16 1449           Core Components/Risk Factors/Patient  Goals Review   Personal Goals Review Increase Strength and Stamina;Increase knowledge of respiratory medications and ability to use respiratory devices properly.;Develop more efficient breathing techniques such as purse lipped breathing and diaphragmatic breathing and practicing self-pacing with activity. Sedentary;Increase Strength and Stamina;Improve shortness of breath with ADL's;Develop more efficient breathing techniques such as purse lipped breathing and diaphragmatic breathing and practicing self-pacing with activity.;Increase knowledge of respiratory medications and ability to use respiratory devices properly.;Diabetes;Hypertension;Weight Management/Obesity      Review Mr Tappan has a good understanding of his MDI's Advair and Proventil. He is taking OFEV for his Pulmonary Fibrosis and has tolerated the medicine well. Mr Haack uses PLB with his exercise goals and activities at home. His mid 67md improved 1012f- minimal importance difference for IPF is [id number] Mr BroCovinoals today. He is maintaining his weight at 214-215lbs and is okay with this. his wife cooks healthy, no salt or sweets, but does like bread. With his history of Asthma, he takes Advair and Proventil, but is recently using a sample from Dr AlvElsworth SohoBreMemory Danceost of inhalers are a concern, so to have a sample is worth using for a month and possibly changing to due to cost. His Diabetes and blood pressures have not been a concern in [facility] and Mr BroArnone compliant with his medicine. His last A1C, was 8.0 and a medicine change was made to his metformin . After [facility], [facility] 2 [employer] classes and add another day of exercise with the machines [facility]       Core Components/Risk Factors/Patient Goals at Discharge (Final Review):      Goals and Risk Factor Review - 01/17/16 1449      Core Components/Risk Factors/Patient Goals Review   Personal Goals Review Sedentary;Increase Strength and Stamina;Improve shortness of breath with ADL's;Develop more efficient breathing techniques such as purse lipped breathing and diaphragmatic breathing and practicing self-pacing with activity.;Increase knowledge of respiratory medications and ability to use respiratory devices properly.;Diabetes;Hypertension;Weight Management/Obesity   Review Reviewed Mr BroJianals today. He is maintaining his weight at 214-215lbs and is okay with this. his wife cooks healthy, no salt or sweets, but does like bread. With his history of Asthma, he takes Advair and Proventil, but is recently using a sample from Dr AlvElsworth SohoBreMemory Danceost of inhalers are a concern, so to have a sample is worth using for a month and possibly changing to due to cost. His Diabetes and blood pressures have not been a concern in [facility] and Mr BroTith compliant with his medicine. His last A1C, was 8.0 and a medicine change was made to his metformin . After [facility], [facility] 2 [employer]ne[employer] classes and add another day of exercise with the machines [facility]    ITP Comments:     ITP Comments    Row Name 12/08/15 1313 02/02/16 1220 02/29/16 1522       ITP Comments VinTaje progressing well with exercise. VinOnur scheduled for cataract surgery on 02/08/2016 and will be unable to attend the next several sessions. Called Mr BroArlotta check on his cataract surgeries. His second eye will be operated on 03/21/16. Mr [place]Marchianont to [facility] [place] [facility] he is cleared for exercise.         Comments: 30 day note  review

## 2016-03-29 ENCOUNTER — Other Ambulatory Visit: Payer: Self-pay | Admitting: Family Medicine

## 2016-03-29 ENCOUNTER — Ambulatory Visit: Payer: Medicare Other | Admitting: Family Medicine

## 2016-04-03 DIAGNOSIS — J849 Interstitial pulmonary disease, unspecified: Secondary | ICD-10-CM | POA: Diagnosis not present

## 2016-04-03 DIAGNOSIS — J8410 Pulmonary fibrosis, unspecified: Secondary | ICD-10-CM

## 2016-04-03 NOTE — Progress Notes (Signed)
Daily Session Note  Patient Details  Name: Jeremiah Gibson MRN: [id number] Date of Birth: 10/01/1935 Referring Provider:   Flowsheet Row Pulmonary Rehab from 11/23/2015 in [facility] Cardiac and Pulmonary Rehab  Referring Provider  [name]      Encounter Date: 04/03/2016  Check In:     Session Check In - 04/03/16 1308      Check-In   Location [facility]-Cardiac & Pulmonary Rehab   Staff Present Earlean Shawl, BS, ACSM CEP, Exercise Physiologist;Laureen Owens Shark, BS, RRT, Respiratory Dareen Piano, BA, ACSM CEP, Exercise Physiologist   Supervising physician immediately available to respond to emergencies [facility] immediately available ER MD   Physician(s) Mariea Clonts and Marcelene Butte   Medication changes reported     No   Fall or balance concerns reported    No   Warm-up and Cool-down Performed as group-led Location manager Performed Yes   VAD Patient? No     Pain Assessment   Currently in Pain? No/denies         Goals Met:  Proper associated with RPD/PD & O2 Sat Independence with exercise equipment Exercise tolerated well Strength training completed today  Goals Unmet:  Not Applicable  Comments: Pt able to follow exercise prescription today without complaint.  Will continue to monitor for progression.    Dr. Emily Filbert is Medical Director for [facility] and [facility] Rehabilitation.

## 2016-04-05 ENCOUNTER — Ambulatory Visit (INDEPENDENT_AMBULATORY_CARE_PROVIDER_SITE_OTHER): Payer: Medicare Other | Admitting: Family Medicine

## 2016-04-05 ENCOUNTER — Encounter: Payer: Self-pay | Admitting: Family Medicine

## 2016-04-05 VITALS — BP 122/68 | HR 80 | Temp 97.5°F | Resp 18 | Wt 211.0 lb

## 2016-04-05 DIAGNOSIS — I10 Essential (primary) hypertension: Secondary | ICD-10-CM | POA: Diagnosis not present

## 2016-04-05 DIAGNOSIS — E785 Hyperlipidemia, unspecified: Secondary | ICD-10-CM

## 2016-04-05 DIAGNOSIS — E1121 Type 2 diabetes mellitus with diabetic nephropathy: Secondary | ICD-10-CM

## 2016-04-05 DIAGNOSIS — J849 Interstitial pulmonary disease, unspecified: Secondary | ICD-10-CM

## 2016-04-05 NOTE — Progress Notes (Signed)
Patient: Jeremiah FeilVincent Rasnic Male    DOB: 10-23-1935   81 y.o.   MRN: [id number] Visit Date: 04/05/2016  Today's Provider: Mila Merryonald Ronson Hagins, MD   Chief Complaint  Patient presents with  . Diabetes    follow up  . Hyperlipidemia    follow up   Subjective:    HPI  Diabetes Mellitus Type II, Follow-up:   Lab Results  Component Value Date   HGBA1C 8.6 (H) 12/28/2015   HGBA1C 7.9 08/10/2015   HGBA1C 7.8 05/11/2015    Last seen for diabetes 3 months ago.  Management since then includes increasing Metformin to 2 x 500mg  twice daily. He reports good compliance with treatment. He is not having side effects.  Current symptoms include none and have been stable. Home blood sugar records: fasting range: 140's  Episodes of hypoglycemia? no   Current Insulin Regimen: none Most Recent Eye Exam: <1 year ago Weight trend: decreasing steadily Prior visit with dietician: no Current diet: well balanced Current exercise: Physical therapy and Silver sneakers  Pertinent Labs:    Component Value Date/Time   CHOL 158 12/28/2015 1014   TRIG 139 12/28/2015 1014   HDL 39 (L) 12/28/2015 1014   LDLCALC 91 12/28/2015 1014   CREATININE 1.23 12/28/2015 1014    Wt Readings from Last 3 Encounters:  03/21/16 215 lb (97.5 kg)  01/31/16 214 lb (97.1 kg)  02/02/16 213 lb (96.6 kg)    ------------------------------------------------------------------------  Lipid/Cholesterol, Follow-up:   Last seen for this 3 months ago.  Management changes since that visit include increasing Simvastatin to 20mg  daily. . Last Lipid Panel:    Component Value Date/Time   CHOL 158 12/28/2015 1014   TRIG 139 12/28/2015 1014   HDL 39 (L) 12/28/2015 1014   CHOLHDL 4.1 12/28/2015 1014   LDLCALC 91 12/28/2015 1014    Risk factors for vascular disease include diabetes mellitus and hypercholesterolemia  He reports good compliance with treatment. He is not having side effects.  Current symptoms include  none and have been stable. Weight trend: decreasing steadily Prior visit with dietician: no Current diet: well balanced Current exercise: Physical therapy  Wt Readings from Last 3 Encounters:  03/21/16 215 lb (97.5 kg)  01/31/16 214 lb (97.1 kg)  02/02/16 213 lb (96.6 kg)    -------------------------------------------------------------------  Follow up pulmonary fibrosis.  On Oveo per pulmonary which he feels he is tolerating well. Required periodic monitoring of liver functions.     No Known Allergies   Current Outpatient Prescriptions:  .  albuterol (VENTOLIN HFA) 108 (90 Base) MCG/ACT inhaler, Inhale 2 puffs into the lungs every 4 (four) hours as needed for wheezing or shortness of breath., Disp: 3 Inhaler, Rfl: 3 .  allopurinol (ZYLOPRIM) 100 MG tablet, Take 2 tablets (200 mg total) by mouth 2 (two) times daily., Disp: 360 tablet, Rfl: 3 .  amLODipine (NORVASC) 5 MG tablet, Take 1 tablet by mouth  every day for blood  pressure, Disp: 90 tablet, Rfl: 3 .  aspirin 81 MG tablet, Take 81 mg by mouth every evening. , Disp: , Rfl:  .  finasteride (PROSCAR) 5 MG tablet, Take 1 tablet by mouth  every day, Disp: 90 tablet, Rfl: 3 .  fish oil-omega-3 fatty acids 1000 MG capsule, Take 1 g by mouth 2 (two) times daily. , Disp: , Rfl:  .  Fluticasone-Salmeterol (ADVAIR DISKUS) 250-50 MCG/DOSE AEPB, Inhale 1 puff into the lungs 2 (two) times daily., Disp: 14 each, Rfl: 0 .  glipiZIDE (GLUCOTROL XL) 10 MG 24 hr tablet, Take 1 tablet by mouth  daily with food for  diabetes; take with largest meal of the day, Disp: 90 tablet, Rfl: 3 .  losartan (COZAAR) 100 MG tablet, Take 1 tablet by mouth  every day for blood  pressure, Disp: 90 tablet, Rfl: 3 .  Magnesium 400 MG TABS, Take 400 mg by mouth 2 (two) times daily. , Disp: , Rfl:  .  metFORMIN (GLUCOPHAGE) 500 MG tablet, Take 2 tablets (1,000 mg total) by mouth 2 (two) times daily with a meal., Disp: 360 tablet, Rfl: 4 .  MULTIPLE VITAMIN PO,  MULTIVITAMINS (Oral Tablet)  1 Every Day for 0 days  Quantity: 0.00;  Refills: 0   Ordered :06-Jan-2010  [name] ;  Started 22-Mar-2009 Active, Disp: , Rfl:  .  Nintedanib (OFEV) 100 MG CAPS, Take 100 mg by mouth 2 (two) times daily with a meal. , Disp: , Rfl:  .  pantoprazole (PROTONIX) 20 MG tablet, Take 20 mg by mouth 2 (two) times daily., Disp: , Rfl:  .  potassium chloride SA (K-DUR,KLOR-CON) 20 MEQ tablet, Take 1 tablet by mouth  daily, Disp: 90 tablet, Rfl: 3 .  simvastatin (ZOCOR) 20 MG tablet, TAKE 1 TABLET(20 MG) BY MOUTH DAILY, Disp: 90 tablet, Rfl: 4 .  terazosin (HYTRIN) 5 MG capsule, Take 1 capsule by mouth  every day for prostate, Disp: 90 capsule, Rfl: 3 .  triamterene-hydrochlorothiazide (MAXZIDE-25) 37.5-25 MG tablet, Take 1 tablet by mouth  Every Day for blood  pressure, Disp: 90 tablet, Rfl: 4  Review of Systems  Constitutional: Negative for appetite change, chills and fever.  Respiratory: Negative for chest tightness, shortness of breath and wheezing.   Cardiovascular: Negative for chest pain and palpitations.  Gastrointestinal: Negative for abdominal pain, nausea and vomiting.  Endocrine: Negative for cold intolerance, heat intolerance, polydipsia, polyphagia and polyuria.    Social History  Substance Use Topics  . Smoking status: Former Smoker    Packs/day: 2.00    Years: 20.00    Types: Cigarettes, Pipe    Quit date: 11/22/1999  . Smokeless tobacco: Never Used     Comment: quit cigs in 72 and pipe in 01  . Alcohol use Yes     Comment: socially   Objective:   BP 122/68 (BP Location: Left Arm, Patient Position: Sitting, Cuff Size: Normal)   Pulse 80   Temp 97.5 F (36.4 C) (Oral)   Resp 18   Wt 211 lb (95.7 kg)   SpO2 95% Comment: room air  BMI 30.28 kg/m   Physical Exam   General Appearance:    Alert, cooperative, no distress  Eyes:    PERRL, conjunctiva/corneas clear, EOM's intact       Lungs:    Mild diffuse wheezing, no rales. ,  respirations unlabored  Heart:    Regular rate and rhythm  Neurologic:   Awake, alert, oriented x 3. No apparent focal neurological           defect.           Assessment & Plan:     1. Diabetes mellitus with nephropathy (HCC) Is tolerating increase dose of metformin. Discussed adding another agent if a1c not improced.  - Hemoglobin A1c  2. Essential hypertension Well controlled.  Continue current medications.   - Renal function panel  3. Hyperlipidemia, unspecified hyperlipidemia type He is tolerating simvastatin well with no adverse effects.   - Lipid panel - Hepatic function  panel  4. ILD (interstitial lung disease) (HCC) Stable continue regular pulmonary follow up. Continue surveillance of liver functions on Oveo. - Hepatic function panel     The entirety of the information documented in the History of Present Illness, Review of Systems and Physical Exam were personally obtained by me. Portions of this information were initially documented by Awilda Bill, CMA and reviewed by me for thoroughness and accuracy.    Mila Merry, MD  [facility]

## 2016-04-06 ENCOUNTER — Telehealth: Payer: Self-pay

## 2016-04-06 LAB — RENAL FUNCTION PANEL
Albumin: 4.5 g/dL (ref 3.5–4.8)
BUN / CREAT RATIO: 22 (ref 10–24)
BUN: 27 mg/dL (ref 8–27)
CALCIUM: 10 mg/dL (ref 8.6–10.2)
CHLORIDE: 99 mmol/L (ref 96–106)
CO2: 23 mmol/L (ref 18–29)
Creatinine, Ser: 1.25 mg/dL (ref 0.76–1.27)
GFR calc non Af Amer: 55 mL/min/{1.73_m2} — ABNORMAL LOW (ref >59)
GFR, EST AFRICAN AMERICAN: 63 mL/min/{1.73_m2} (ref >59)
Glucose: 140 mg/dL — ABNORMAL HIGH (ref 65–99)
POTASSIUM: 3.7 mmol/L (ref 3.5–5.2)
Phosphorus: 3.6 mg/dL (ref 2.5–4.5)
SODIUM: 143 mmol/L (ref 134–144)

## 2016-04-06 LAB — LIPID PANEL
CHOL/HDL RATIO: 3.4 ratio (ref 0.0–5.0)
Cholesterol, Total: 146 mg/dL (ref 100–199)
HDL: 43 mg/dL (ref >39)
LDL Calculated: 71 mg/dL (ref 0–99)
Triglycerides: 160 mg/dL — ABNORMAL HIGH (ref 0–149)
VLDL CHOLESTEROL CAL: 32 mg/dL (ref 5–40)

## 2016-04-06 LAB — HEPATIC FUNCTION PANEL
ALK PHOS: 83 IU/L (ref 39–117)
ALT: 22 IU/L (ref 0–44)
AST: 24 IU/L (ref 0–40)
Bilirubin Total: 0.3 mg/dL (ref 0.0–1.2)
Bilirubin, Direct: 0.12 mg/dL (ref 0.00–0.40)
TOTAL PROTEIN: 7.3 g/dL (ref 6.0–8.5)

## 2016-04-06 LAB — HEMOGLOBIN A1C
ESTIMATED AVERAGE GLUCOSE: 154 mg/dL
HEMOGLOBIN A1C: 7 % — AB (ref 4.8–5.6)

## 2016-04-06 NOTE — Telephone Encounter (Signed)
Patient advised as directed below. Patient  Verbalized understanding.  Thanks,  -Quinntin Malter 

## 2016-04-06 NOTE — Telephone Encounter (Signed)
-----   Message from Malva Limesonald E Fisher, MD sent at 04/06/2016  8:01 AM EST ----- a1c is much better, down to 7.0. Cholesterol is well controlled at 146. Normal liver and kidney functions. Continue current medications.  Schedule follow up for diabetes in 4-5 months.

## 2016-04-07 ENCOUNTER — Encounter: Payer: Medicare Other | Admitting: *Deleted

## 2016-04-07 DIAGNOSIS — J8410 Pulmonary fibrosis, unspecified: Secondary | ICD-10-CM

## 2016-04-07 DIAGNOSIS — J849 Interstitial pulmonary disease, unspecified: Secondary | ICD-10-CM | POA: Diagnosis not present

## 2016-04-07 NOTE — Progress Notes (Signed)
Daily Session Note  Patient Details  Name: Jeremiah Gibson MRN: [id number] Date of Birth: 05/05/1935 Referring Provider:   Flowsheet Row Pulmonary Rehab from 11/23/2015 in [facility] Cardiac and Pul[facility] Rehab  Referring Provider  Alva      Encounter Date: 04/07/2016  Check In:     Session Check In - 04/07/16 1207      Check-In   Location [facility]   Staff Present Carroll Enterkin, RN, BSN;Jessica Hawkins, MA, ACSM RCEP, Exercise Physiologist;Patricia Surles RN BSN   Physician(s) Dr. Paduchowski and Dr. Robinson   Medication changes reported     No   Fall or balance concerns reported    No   Warm-up and Cool-down Performed on first and last piece of equipment   Resistance Training Performed Yes   VAD Patient? No     Pain Assessment   Currently in Pain? No/denies         Goals Met:  Proper associated with RPD/PD & O2 Sat Exercise tolerated well  Goals Unmet:  Not Applicable  Comments:     Dr. Mark Miller is Medical Director for [facility] and [facility]. 

## 2016-04-10 DIAGNOSIS — J849 Interstitial pulmonary disease, unspecified: Secondary | ICD-10-CM | POA: Diagnosis not present

## 2016-04-10 DIAGNOSIS — J8410 Pulmonary fibrosis, unspecified: Secondary | ICD-10-CM

## 2016-04-10 NOTE — Progress Notes (Signed)
Daily Session Note  Patient Details  Name: Jeremiah Gibson MRN: [id number] Date of Birth: [birth year] Referring Provider:   Flowsheet Row Pulmonary Rehab from 11/23/2015 in [facility] Card[facility] and Pulmonary Rehab  Referring Provider  [name]      Encounter Date: 04/10/2016  Check In:     Session Check In - 04/10/16 1240      Check-In   Location [facility]   Staff Present Carson Myrtle, BS, RRT, Respiratory Therapist;Kelly Amedeo Plenty, BS, ACSM CEP, Exercise Physiologist;Conchetta Lamia Oletta Darter, BA, ACSM CEP, Exercise Physiologist   Supervising physician immediately available to respond to emergencies [facility] immediately available ER MD   Physician(s) Corky Downs and Paduchowski   Medication changes reported     No   Fall or balance concerns reported    No   Warm-up and Cool-down Performed as group-led instruction   Resistance Training Performed Yes   VAD Patient? No     Pain Assessment   Currently in Pain? No/denies         Goals Met:  Proper associated with RPD/PD & O2 Sat Independence with exercise equipment Exercise tolerated well Strength training completed today  Goals Unmet:  Not Applicable  Comments: Pt able to follow exercise prescription today without complaint.  Will continue to monitor for progression.    Dr. Emily Filbert is Medical Director for [name] and [facility].

## 2016-04-12 ENCOUNTER — Telehealth: Payer: Self-pay | Admitting: Pulmonary Disease

## 2016-04-12 NOTE — Telephone Encounter (Signed)
Liver tests okay Electrolytes okay

## 2016-04-12 NOTE — Telephone Encounter (Signed)
Pt aware of results and voiced his understanding. Nothing further needed.  

## 2016-04-12 NOTE — Telephone Encounter (Signed)
Pt called to let RA know that he completed his labs on 04/05/16.  Please advise RA if there is anything that we need to tell the pt. thanks

## 2016-04-14 ENCOUNTER — Encounter: Payer: Medicare Other | Attending: Pulmonary Disease | Admitting: *Deleted

## 2016-04-14 DIAGNOSIS — J4530 Mild persistent asthma, uncomplicated: Secondary | ICD-10-CM | POA: Insufficient documentation

## 2016-04-14 DIAGNOSIS — J8410 Pulmonary fibrosis, unspecified: Secondary | ICD-10-CM

## 2016-04-14 DIAGNOSIS — J849 Interstitial pulmonary disease, unspecified: Secondary | ICD-10-CM | POA: Diagnosis not present

## 2016-04-17 NOTE — Progress Notes (Signed)
Daily Session Note  Patient Details  Name: Jeremiah Gibson MRN: [id number] Date of Birth: 01-30-1936 Referring Provider:   Flowsheet Row Pulmonary Rehab from 11/23/2015 in [facility] Card[facility] and Pulmonary Rehab  Referring Provider  [name]      Encounter Date: 04/14/2016  Check In:      Goals Met:  Proper associated with RPD/PD & O2 Sat Independence with exercise equipment Using PLB without cueing & demonstrates good technique Exercise tolerated well Strength training completed today  Goals Unmet:  Not Applicable  Comments: Pt able to follow exercise prescription today without complaint.  Will continue to monitor for progression.    Dr. Emily Filbert is Medical Director for [place] and [facility].

## 2016-04-18 ENCOUNTER — Encounter: Payer: Self-pay | Admitting: Respiratory Therapy

## 2016-04-18 DIAGNOSIS — J8410 Pulmonary fibrosis, unspecified: Secondary | ICD-10-CM

## 2016-04-18 NOTE — Progress Notes (Signed)
Discharge Summary  Patient Details  Name: Lutricia FeilVincent Stimson MRN: [id number] Date of Birth: [birth year] Referring Provider:   Flowsheet Row Pulmonary Rehab [facility]23/[facility]  Referring Provider  Alva       Number of Visits: 2036 Reason for Discharge:  Patient reached a stable level of exercise. Patient independent in their exercise.  Smoking History:  History  Smoking Status   Former Smoker   Packs/day: 2.00   Years: 20.00   Types: Cigarettes, Pipe   Quit date: 11/22/1999  Smokeless Tobacco   Never Used    Comment: quit cigs in 72 and pipe in 01    Diagnosis:  Pulmonary fibrosis (HCC)  ADL UCSD:     Pulmonary Assessment Scores    Row Name 11/23/15 1015 11/23/15 1311 01/05/16 1512     ADL UCSD   ADL Phase Entry Entry Mid   SOB Score total 8 8 32   Rest 0 0 0   Walk 0 0 1   Stairs 1 1 3    Bath 0 0 1   Dress 0 0 2   Shop 0 0 1   Row Name 03/24/16 1313 04/10/16 1601 04/10/16 1604     ADL UCSD   ADL Phase Exit Exit Entry  11/23/15 Entry Scoring   SOB Score total 25  -- 8   Rest 0  -- 0   Walk 1  -- 0   Stairs 2  -- 1   Bath 0  -- 0   Dress 1  -- 0   Shop 1  -- 0      Initial Exercise Prescription:     Initial Exercise Prescription - 11/23/15 1200      Date of Initial Exercise RX and Referring Provider   Date 11/23/15   Referring Provider Vassie LollAlva     Treadmill   MPH 1.7   Grade 0.5   Minutes 15   METs 2.42     Recumbant Bike   Level 1   RPM 60   Minutes 15   METs 2.3     NuStep   Level 2   Minutes 15   METs 2     T5 Nustep   Level 1   Minutes 15   METs 2     Biostep-RELP   Level 2   Minutes 15   METs 2     Prescription Details   Frequency (times per week) 3   Duration Progress to 45 minutes of aerobic exercise without signs/symptoms of physical distress     Intensity   THRR 40-80% of Max Heartrate 107-130   Ratings of Perceived Exertion 11-13   Perceived Dyspnea 0-4     Progression    Progression Continue to progress workloads to maintain intensity without signs/symptoms of physical distress.     Resistance Training   Training Prescription Yes   Weight 2   Reps 10-15      Discharge Exercise Prescription (Final Exercise Prescription Changes):     Exercise Prescription Changes - 04/12/16 1500      Exercise Review   Progression Yes     Response to Exercise   Blood Pressure (Admit) 114/68   Blood Pressure (Exercise) 132/80   Blood Pressure (Exit) 130/80   Heart Rate (Admit) 82 bpm   Heart Rate (Exercise) 91 bpm   Heart Rate (Exit) 79 bpm   Oxygen Saturation (Admit) 93 %   Oxygen Saturation (Exercise) 95 %   Oxygen Saturation (  Exit) 97 %   Rating of Perceived Exertion (Exercise) 16   Perceived Dyspnea (Exercise) 3   Symptoms none   Comments Home Exercise Guidelines given 01/19/16   Duration Progress to 45 minutes of aerobic exercise without signs/symptoms of physical distress   Intensity THRR unchanged     Progression   Progression Continue to progress workloads to maintain intensity without signs/symptoms of physical distress.   Average METs 2     Resistance Training   Training Prescription Yes   Weight 4 lbs   Reps 10-15     Interval Training   Interval Training No     Arm/Foot Ergometer   Level 4.5   Minutes 15     T5 Nustep   Level 5   Minutes 15   METs 2.1     Biostep-RELP   Level 7   Minutes 15   METs 2     Home Exercise Plan   Plans to continue exercise at [employer] (comment)  walking and [employer] (Silver [employer]neakers)   Frequency Add 2 additional days to program exercise sessions.      Functional Capacity:     6 Minute Walk    Row Name 11/23/15 1236 01/05/16 1241 02/02/16 1049     6 Minute Walk   Phase  -- Mid Program Discharge   Distance 1320 feet 1425 feet 1476 feet   Distance % Change  -- 7.5 % 11.8 %   Walk Time 6 minutes 6 minutes 6 minutes   # of Rest Breaks  -- 0 0   MPH 2.5 2.69 2.79   METS 2.49 2.82 2.88    RPE 13 16 16    Perceived Dyspnea  3 4 4    VO2 Peak 8.7 9.8 10.1   Symptoms No No No   Resting HR 85 bpm 88 bpm 90 bpm   Resting BP 124/76 126/74 124/64   Max Ex. HR 105 bpm 113 bpm 126 bpm   Max Ex. BP 144/64 152/56 132/74   2 Minute Post BP  --  -- 134/70     Interval HR   Baseline HR 85 88 90   1 Minute HR 92 104 102   2 Minute HR 93 107  --   3 Minute HR 102  -- 124   4 Minute HR 103 113  --   5 Minute HR 103 112 126   6 Minute HR 105 112 113   2 Minute Post HR 96 97  --   Interval Heart Rate? Yes Yes Yes     Interval Oxygen   Interval Oxygen? Yes Yes  --   Baseline Oxygen Saturation % 97 % 94 % 94 %   1 Minute Oxygen Saturation % 93 % 94 % 94 %   2 Minute Oxygen Saturation % 88 % 94 % 94 %   3 Minute Oxygen Saturation % 89 %  -- 94 %   4 Minute Oxygen Saturation % 90 % 94 % 94 %   5 Minute Oxygen Saturation % 91 % 94 % 94 %   6 Minute Oxygen Saturation % 93 % 94 % 94 %   2 Minute Post Oxygen Saturation % 96 % 94 % 96 %      Psychological, QOL, Others - Outcomes: PHQ 2/9: Depression screen [facility] 2/9 04/10/2016 12/28/2015 11/23/2015  Decreased Interest 0 0 0  Down, Depressed, Hopeless 0 0 0  PHQ - 2 Score 0 0 0  Altered sleeping 0 -  0  Tired, decreased energy 0 - 0  Change in appetite 0 - 0  Feeling bad or failure about yourself  0 - 0  Trouble concentrating 0 - 0  Moving slowly or fidgety/restless 0 - 0  Suicidal thoughts 0 - 0  PHQ-9 Score 0 - 0    Quality of Life:     Quality of Life - 03/24/16 1228      Quality of Life Scores   Health/Function Pre 20.19 %   Health/Function Post 19.93 %   Health/Function % Change -1.29 %   Socioeconomic Pre 20.4 %   Socioeconomic Post 20.44 %   Socioeconomic % Change  0.2 %   Psych/Spiritual Pre 20.29 %   Psych/Spiritual Post 28.79 %   Psych/Spiritual % Change 41.89 %   Family Pre 20 %   Family Post 22.9 %   Family % Change 14.5 %   GLOBAL Pre 20.21 %   GLOBAL Post 22.24 %   GLOBAL % Change 10.04 %       Personal Goals: Goals established at orientation with interventions provided to work toward goal.     Personal Goals and Risk Factors at Admission - 11/23/15 1319      Core Components/Risk Factors/Patient Goals on Admission    Weight Management Yes   Intervention Weight Management: Develop a combined nutrition and exercise program designed to reach desired caloric intake, while maintaining appropriate intake of nutrient and fiber, sodium and fats, and appropriate energy expenditure required for the weight goal.  Mr Samad prefers not to meet with the dietitian. His wife is a good , healthy cook. He does like bread, but not sweets.   Admit Weight 214 lb 6.4 oz (97.3 kg)   Goal Weight: Short Term 209 lb (94.8 kg)   Goal Weight: Long Term 204 lb (92.5 kg)   Expected Outcomes Understanding of distribution of calorie intake throughout the day with the consumption of 4-5 meals/snacks   Sedentary Yes   Intervention Provide advice, education, support and counseling about physical activity/exercise needs.;Develop an individualized exercise prescription for aerobic and resistive training based on initial evaluation findings, risk stratification, comorbidities and participant's personal goals.  He does the Silver Sneakers class twice a week..   Expected Outcomes Achievement of increased cardiorespiratory fitness and enhanced flexibility, muscular endurance and strength shown through measurements of functional capacity and personal statement of participant.   Increase Strength and Stamina Yes   Intervention Provide advice, education, support and counseling about physical activity/exercise needs.;Develop an individualized exercise prescription for aerobic and resistive training based on initial evaluation findings, risk stratification, comorbidities and participant's personal goals.   Expected Outcomes Achievement of increased cardiorespiratory fitness and enhanced flexibility, muscular endurance and  strength shown through measurements of functional capacity and personal statement of participant.   Improve shortness of breath with ADL's Yes   Intervention Provide education, individualized exercise plan and daily activity instruction to help decrease symptoms of SOB with activities of daily living.   Expected Outcomes Short Term: Achieves a reduction of symptoms when performing activities of daily living.   Develop more efficient breathing techniques such as purse lipped breathing and diaphragmatic breathing; and practicing self-pacing with activity Yes   Intervention Provide education, demonstration and support about specific breathing techniuqes utilized for more efficient breathing. Include techniques such as pursed lipped breathing, diaphragmatic breathing and self-pacing activity.   Expected Outcomes Short Term: Participant will be able to demonstrate and use breathing techniques as needed throughout daily activities.  Increase knowledge of respiratory medications and ability to use respiratory devices properly  Yes   Intervention Provide education and demonstration as needed of appropriate use of medications, inhalers, and oxygen therapy.  Advair and Proventil; spacer given   Expected Outcomes Short Term: Achieves understanding of medications use. Understands that oxygen is a medication prescribed by physician. Demonstrates appropriate use of inhaler and oxygen therapy.   Diabetes Yes   Intervention Provide education about signs/symptoms and action to take for hypo/hyperglycemia.;Provide education about proper nutrition, including hydration, and aerobic/resistive exercise prescription along with prescribed medications to achieve blood glucose in normal ranges: Fasting glucose 65-99 mg/dL   Expected Outcomes Short Term: Participant verbalizes understanding of the signs/symptoms and immediate care of hyper/hypoglycemia, proper foot care and importance of medication, aerobic/resistive exercise  and nutrition plan for blood glucose control.;Long Term: Attainment of HbA1C < 7%.   Hypertension Yes   Intervention Provide education on lifestyle modifcations including regular physical activity/exercise, weight management, moderate sodium restriction and increased consumption of fresh fruit, vegetables, and low fat dairy, alcohol moderation, and smoking cessation.;Monitor prescription use compliance.   Expected Outcomes Short Term: Continued assessment and intervention until BP is < 140/48mm HG in hypertensive participants. < 130/82mm HG in hypertensive participants with diabetes, heart failure or chronic kidney disease.;Long Term: Maintenance of blood pressure at goal levels.       Personal Goals Discharge:     Goals and Risk Factor Review    Row Name 12/01/15 1243 12/10/15 1250 12/10/15 1252 12/10/15 1253 12/29/15 1157     Core Components/Risk Factors/Patient Goals Review   Personal Goals Review Weight Management/Obesity;Sedentary;Increase Strength and Stamina;Hypertension;Diabetes Increase knowledge of respiratory medications and ability to use respiratory devices properly. Improve shortness of breath with ADL's Develop more efficient breathing techniques such as purse lipped breathing and diaphragmatic breathing and practicing self-pacing with activity. Weight Management/Obesity;Sedentary;Diabetes;Hypertension   Review Avrey had a good first day of exercise.  He was doing silver sneakers before, but not while in program.  His blood pressure was good here, but he does not check it at home.  His blood sugars have been good at home and here. He was down 1/2 lb today! Mr. Beezley takes an Albuterol MDI and Advair at home for his breathing.  He has a spacer for the albuterol and knows to rinse his mouth after taking the Advair. Mr. Zhong has not seen any improventment in his SOB.  He is only on his seventh session. Mr. Kosta demonstrates proper technique for PLB. Dakhari has not lost weight but  is maintaining his current weight.  He consistently attends class 3 days per week and has not yet missed a session.  He never had trouble with ADLs excpet walking up hills or stairs.  Bp has been within normal limits when he comes to class.Marland Kitchen  His a1c level was higher yesterday at the Dr but he feels it may be due to a new medication he takes for pukmonary fibrosis.  He will follow up with his Dr about this.   Expected Outcomes Adair will continue to come to exercise and education classes for risk factor modification.  We continue to monitor for progression. Taking his respiratory medications correctly will help reduces SOB and exacerbations.  Continued cardio and strength training should start to help Mr. Frampton improve on his SOB and make ADLs easier for him. PLB will help Mr. Eggert regain his breath during exercise and episodes of SOB.  Madox will maintain his attendance and see his  weight stay the same or go down.  His other vitals - BP and BG will remain well controlled.   Row Name 01/05/16 1515 01/17/16 1449 04/10/16 1544         Core Components/Risk Factors/Patient Goals Review   Personal Goals Review Increase Strength and Stamina;Increase knowledge of respiratory medications and ability to use respiratory devices properly.;Develop more efficient breathing techniques such as purse lipped breathing and diaphragmatic breathing and practicing self-pacing with activity. Sedentary;Increase Strength and Stamina;Improve shortness of breath with ADL's;Develop more efficient breathing techniques such as purse lipped breathing and diaphragmatic breathing and practicing self-pacing with activity.;Increase knowledge of respiratory medications and ability to use respiratory devices properly.;Diabetes;Hypertension;Weight Management/Obesity Sedentary;Increase Strength and Stamina;Improve shortness of breath with ADL's;Develop more efficient breathing techniques such as purse lipped breathing and diaphragmatic  breathing and practicing self-pacing with activity.;Increase knowledge of respiratory medications and ability to use respiratory devices properly.;Diabetes;Hypertension     Review Mr Toya has a good understanding of his MDI's Advair and Proventil. He is taking OFEV for his Pulmonary Fibrosis and has tolerated the medicine well. Mr Danielson uses PLB with his exercise goals and activities at home. His mid improved 18ft - minimal importance difference for IPF is 78.72-182ft. Reviewed Mr Woerner goals today. He is maintaining his weight at 214-215lbs and is okay with this. his wife cooks healthy, no salt or sweets, but does like bread. With his history of Asthma, he takes Advair and Proventil, but is recently using a sample from Dr Vassie Loll - [facility]. Cost of inhalers are a concern, so to have a sample is worth using for a month and possibly changing to due to cost. His Diabetes and blood pressures have not been a concern in [facility] and Mr Kesinger is compliant with his medicine. His last A1C, was 8.0 and a medicine change was made to his metformin . After [facility], he will continue at the [employer] in the 2 Silver Sneakers classes and add another day of exercise with the machines or walking the track at the [facility]. Mr Barbier will be graduating in 3 sessions. He has increased his exercise goals and increased his post by 149ft.  The Minimal Importance Difference for IPF is 78.72 to 147.19ft. His plans for exercise will be to return to the [facility] and perform the Silver Sneaker group exercise classes. He does want to incorporate at least one day/week on the exercise equipment. Mr Legate has a good understanding of his MDI's. He is currently using a Breo sample, but he does prefer the Advair. He is compliant with his medications and had a good AIC of 7.0 from his last reading of 8.0. He has good PLB technique and his shortness of breath has been mild with PD's 3-4. Mr Barsky attended regularly and enjoyed the education.        Expected Outcomes  --  -- Continue exercising and continue self-management of his Pulmonary Fibrosis by applying the knowledge he learned in [facility].        Nutrition & Weight - Outcomes:     Pre Biometrics - 11/23/15 1234      Pre Biometrics   Height 5\' 10"  (1.778 m)   Weight 214 lb 8 oz (97.3 kg)   Waist Circumference 44 inches   Hip Circumference 43 inches   Waist to Hip Ratio 1.02 %   BMI (Calculated) 30.8         Post Biometrics - 02/02/16 1048       Post  Biometrics  Height 5\' 10"  (1.778 m)   Weight 213 lb (96.6 kg)   Waist Circumference 43 inches   Hip Circumference 43 inches   Waist to Hip Ratio 1 %   BMI (Calculated) 30.6      Nutrition:   Nutrition Discharge:   Education Questionnaire Score:     Knowledge Questionnaire Score - 03/24/16 1230      Knowledge Questionnaire Score   Post Score 9/10      Goals reviewed with patient; copy given to patient.

## 2016-04-19 ENCOUNTER — Encounter: Payer: Medicare Other | Admitting: *Deleted

## 2016-04-19 DIAGNOSIS — J849 Interstitial pulmonary disease, unspecified: Secondary | ICD-10-CM | POA: Diagnosis not present

## 2016-04-19 DIAGNOSIS — J8410 Pulmonary fibrosis, unspecified: Secondary | ICD-10-CM

## 2016-04-19 NOTE — Progress Notes (Signed)
Daily Session Note  Patient Details  Name: Jeremiah Gibson MRN: [id number] Date of Birth: [birth year] Referring Provider:   April Manson Pulmonary Rehab from 11/23/2015 in [facility] Cardiac and Pulmonary Rehab  Referring Provider  [name]      Encounter Date: 04/19/2016  Check In:     Session Check In - 04/19/16 1012      Check-In   Location [facility]-Cardiac & Pulmonary Rehab   Staff Present Alberteen Sam, MA, ACSM RCEP, Exercise Physiologist;Laureen Owens Shark, BS, RRT, Respiratory Dareen Piano, BA, ACSM CEP, Exercise Physiologist   Supervising physician immediately available to respond to emergencies [facility] immediately available ER MD   Physician(s) Drs. Malinda and Robinson   Medication changes reported     No   Fall or balance concerns reported    No   Warm-up and Cool-down Performed as group-led Location manager Performed Yes   VAD Patient? No     Pain Assessment   Currently in Pain? No/denies   Multiple Pain Sites No         Goals Met:  Proper associated with RPD/PD & O2 Sat Independence with exercise equipment Using PLB without cueing & demonstrates good technique Exercise tolerated well Strength training completed today  Goals Unmet:  Not Applicable  Comments: Pt able to follow exercise prescription today without complaint.  Will continue to monitor for progression.    Dr. Emily Filbert is Medical Director for [place] and [facility].

## 2016-04-19 NOTE — Patient Instructions (Signed)
Discharge Instructions  Patient Details  Name: Jeremiah FeilVincent Lesiak MRN: [id number] Date of Birth: 01-17-1936 Referring Provider:  Oretha MilchAlva, Rakesh V, MD   Number of Visits: [id number]  Reason for Discharge:  Patient reached a stable level of exercise. Patient independent in their exercise.  Smoking History:  History  Smoking Status   Former Smoker   Packs/day: 2.00   Years: 20.00   Types: Cigarettes, Pipe   Quit date: 11/22/1999  Smokeless Tobacco   Never Used    Comment: quit cigs in 72 and pipe in 01    Diagnosis:  Pulmonary fibrosis (HCC)  Initial Exercise Prescription:     Initial Exercise Prescription - 11/23/15 1200      Date of Initial Exercise RX and Referring Provider   Date 11/23/15   Referring Provider Vassie LollAlva     Treadmill   MPH 1.7   Grade 0.5   Minutes 15   METs 2.42     Recumbant Bike   Level 1   RPM 60   Minutes 15   METs 2.3     NuStep   Level 2   Minutes 15   METs 2     T5 Nustep   Level 1   Minutes 15   METs 2     Biostep-RELP   Level 2   Minutes 15   METs 2     Prescription Details   Frequency (times per week) 3   Duration Progress to 45 minutes of aerobic exercise without signs/symptoms of physical distress     Intensity   THRR 40-80% of Max Heartrate 107-130   Ratings of Perceived Exertion 11-13   Perceived Dyspnea 0-4     Progression   Progression Continue to progress workloads to maintain intensity without signs/symptoms of physical distress.     Resistance Training   Training Prescription Yes   Weight 2   Reps 10-15      Discharge Exercise Prescription (Final Exercise Prescription Changes):     Exercise Prescription Changes - 04/12/16 1500      Exercise Review   Progression Yes     Response to Exercise   Blood Pressure (Admit) 114/68   Blood Pressure (Exercise) 132/80   Blood Pressure (Exit) 130/80   Heart Rate (Admit) 82 bpm   Heart Rate (Exercise) 91 bpm   Heart Rate (Exit) 79 bpm   Oxygen Saturation  (Admit) 93 %   Oxygen Saturation (Exercise) 95 %   Oxygen Saturation (Exit) 97 %   Rating of Perceived Exertion (Exercise) 16   Perceived Dyspnea (Exercise) 3   Symptoms none   Comments Home Exercise Guidelines given 01/19/16   Duration Progress to 45 minutes of aerobic exercise without signs/symptoms of physical distress   Intensity THRR unchanged     Progression   Progression Continue to progress workloads to maintain intensity without signs/symptoms of physical distress.   Average METs 2     Resistance Training   Training Prescription Yes   Weight 4 lbs   Reps 10-15     Interval Training   Interval Training No     Arm/Foot Ergometer   Level 4.5   Minutes 15     T5 Nustep   Level 5   Minutes 15   METs 2.1     Biostep-RELP   Level 7   Minutes 15   METs 2     Home Exercise Plan   [facility]  walking and [employer])  Frequency Add 2 additional days to program exercise sessions.      Functional Capacity:     6 Minute Walk    Row Name 11/23/15 1236 01/05/16 1241 02/02/16 1049     6 Minute Walk   Phase  -- Mid Program Discharge   Distance 1320 feet 1425 feet 1476 feet   Distance % Change  -- 7.5 % 11.8 %   Walk Time 6 minutes 6 minutes 6 minutes   # of Rest Breaks  -- 0 0   MPH 2.5 2.69 2.79   METS 2.49 2.82 2.88   RPE 13 16 16    Perceived Dyspnea  3 4 4    VO2 Peak 8.7 9.8 10.1   Symptoms No No No   Resting HR 85 bpm 88 bpm 90 bpm   Resting BP 124/76 126/74 124/64   Max Ex. HR 105 bpm 113 bpm 126 bpm   Max Ex. BP 144/64 152/56 132/74   2 Minute Post BP  --  -- 134/70     Interval HR   Baseline HR 85 88 90   1 Minute HR 92 104 102   2 Minute HR 93 107  --   3 Minute HR 102  -- 124   4 Minute HR 103 113  --   5 Minute HR 103 112 126   6 Minute HR 105 112 113   2 Minute Post HR 96 97  --   Interval Heart Rate? Yes Yes Yes     Interval Oxygen   Interval Oxygen? Yes Yes  --   Baseline  Oxygen Saturation % 97 % 94 % 94 %   1 Minute Oxygen Saturation % 93 % 94 % 94 %   2 Minute Oxygen Saturation % 88 % 94 % 94 %   3 Minute Oxygen Saturation % 89 %  -- 94 %   4 Minute Oxygen Saturation % 90 % 94 % 94 %   5 Minute Oxygen Saturation % 91 % 94 % 94 %   6 Minute Oxygen Saturation % 93 % 94 % 94 %   2 Minute Post Oxygen Saturation % 96 % 94 % 96 %      Quality of Life:     Quality of Life - 03/24/16 1228      Quality of Life Scores   Health/Function Pre 20.19 %   Health/Function Post 19.93 %   Health/Function % Change -1.29 %   Socioeconomic Pre 20.4 %   Socioeconomic Post 20.44 %   Socioeconomic % Change  0.2 %   Psych/Spiritual Pre 20.29 %   Psych/Spiritual Post 28.79 %   Psych/Spiritual % Change 41.89 %   Family Pre 20 %   Family Post 22.9 %   Family % Change 14.5 %   GLOBAL Pre 20.21 %   GLOBAL Post 22.24 %   GLOBAL % Change 10.04 %      Personal Goals: Goals established at orientation with interventions provided to work toward goal.     Personal Goals and Risk Factors at Admission - 11/23/15 1319      Core Components/Risk Factors/Patient Goals on Admission    Weight Management Yes   Intervention Weight Management: Develop a combined nutrition and exercise program designed to reach desired caloric intake, while maintaining appropriate intake of nutrient and fiber, sodium and fats, and appropriate energy expenditure required for the weight goal.  Mr Borntreger prefers not to meet with the dietitian. His wife is a  good , healthy cook. He does like bread, but not sweets.   Admit Weight 214 lb 6.4 oz (97.3 kg)   Goal Weight: Short Term 209 lb (94.8 kg)   Goal Weight: Long Term 204 lb (92.5 kg)   Expected Outcomes Understanding of distribution of calorie intake throughout the day with the consumption of 4-5 meals/snacks   Sedentary Yes   Intervention Provide advice, education, support and counseling about physical activity/exercise needs.;Develop an  individualized exercise prescription for aerobic and resistive training based on initial evaluation findings, risk stratification, comorbidities and participant's personal goals.  He does the [employer] class twice a week..   Expected Outcomes Achievement of increased cardiorespiratory fitness and enhanced flexibility, muscular endurance and strength shown through measurements of functional capacity and personal statement of participant.   Increase Strength and Stamina Yes   Intervention Provide advice, education, support and counseling about physical activity/exercise needs.;Develop an individualized exercise prescription for aerobic and resistive training based on initial evaluation findings, risk stratification, comorbidities and participant's personal goals.   Expected Outcomes Achievement of increased cardiorespiratory fitness and enhanced flexibility, muscular endurance and strength shown through measurements of functional capacity and personal statement of participant.   Improve shortness of breath with ADL's Yes   Intervention Provide education, individualized exercise plan and daily activity instruction to help decrease symptoms of SOB with activities of daily living.   Expected Outcomes Short Term: Achieves a reduction of symptoms when performing activities of daily living.   Develop more efficient breathing techniques such as purse lipped breathing and diaphragmatic breathing; and practicing self-pacing with activity Yes   Intervention Provide education, demonstration and support about specific breathing techniuqes utilized for more efficient breathing. Include techniques such as pursed lipped breathing, diaphragmatic breathing and self-pacing activity.   Expected Outcomes Short Term: Participant will be able to demonstrate and use breathing techniques as needed throughout daily activities.   Increase knowledge of respiratory medications and ability to use respiratory devices properly   Yes   Intervention Provide education and demonstration as needed of appropriate use of medications, inhalers, and oxygen therapy.  Advair and Proventil; spacer given   Expected Outcomes Short Term: Achieves understanding of medications use. Understands that oxygen is a medication prescribed by physician. Demonstrates appropriate use of inhaler and oxygen therapy.   Diabetes Yes   Intervention Provide education about signs/symptoms and action to take for hypo/hyperglycemia.;Provide education about proper nutrition, including hydration, and aerobic/resistive exercise prescription along with prescribed medications to achieve blood glucose in normal ranges: Fasting glucose 65-99 mg/dL   Expected Outcomes Short Term: Participant verbalizes understanding of the signs/symptoms and immediate care of hyper/hypoglycemia, proper foot care and importance of medication, aerobic/resistive exercise and nutrition plan for blood glucose control.;Long Term: Attainment of HbA1C < 7%.   Hypertension Yes   Intervention Provide education on lifestyle modifcations including regular physical activity/exercise, weight management, moderate sodium restriction and increased consumption of fresh fruit, vegetables, and low fat dairy, alcohol moderation, and smoking cessation.;Monitor prescription use compliance.   Expected Outcomes Short Term: Continued assessment and intervention until BP is < 140/55mm HG in hypertensive participants. < 130/3mm HG in hypertensive participants with diabetes, heart failure or chronic kidney disease.;Long Term: Maintenance of blood pressure at goal levels.       Personal Goals Discharge:     Goals and Risk Factor Review - 04/10/16 1544      Core Components/Risk Factors/Patient Goals Review   Personal Goals Review Sedentary;Increase Strength and Stamina;Improve shortness of breath with ADL's;Develop  more efficient breathing techniques such as purse lipped breathing and diaphragmatic breathing  and practicing self-pacing with activity.;Increase knowledge of respiratory medications and ability to use respiratory devices properly.;Diabetes;Hypertension   Review Mr Salinger will be graduating in 3 sessions. He has increased his exercise goals and increased his post by 117ft.  The Minimal Importance Difference for IPF is 78.72 to 147.68ft. His plans for exercise will be to return to the [employer] and perform the [employer] group exercise classes. He does want to incorporate at least one day/week on the exercise equipment. Mr Beagley has a good understanding of his MDI's. He is currently using a Breo sample, but he does prefer the Advair. He is compliant with his medications and had a good AIC of 7.0 from his last reading of 8.0. He has good PLB technique and his shortness of breath has been mild with PD's 3-4. Mr Cowdrey attended regularly and enjoyed the education.     Expected Outcomes Continue exercising and continue self-management of his Pulmonary Fibrosis by applying the knowledge he learned in [facility].      Nutrition & Weight - Outcomes:     Pre Biometrics - 11/23/15 1234      Pre Biometrics   Height 5\' 10"  (1.778 m)   Weight 214 lb 8 oz (97.3 kg)   Waist Circumference 44 inches   Hip Circumference 43 inches   Waist to Hip Ratio 1.02 %   BMI (Calculated) 30.8         Post Biometrics - 02/02/16 1048       Post  Biometrics   Height 5\' 10"  (1.778 m)   Weight 213 lb (96.6 kg)   Waist Circumference 43 inches   Hip Circumference 43 inches   Waist to Hip Ratio 1 %   BMI (Calculated) 30.6      Nutrition:   Nutrition Discharge:   Education Questionnaire Score:     Knowledge Questionnaire Score - 03/24/16 1230      Knowledge Questionnaire Score   Post Score 9/10      Goals reviewed with patient; copy given to patient.

## 2016-04-21 LAB — HM DIABETES EYE EXAM

## 2016-04-24 DIAGNOSIS — J849 Interstitial pulmonary disease, unspecified: Secondary | ICD-10-CM | POA: Diagnosis not present

## 2016-04-24 DIAGNOSIS — J8410 Pulmonary fibrosis, unspecified: Secondary | ICD-10-CM

## 2016-04-24 NOTE — Progress Notes (Signed)
Pulmonary Individual Treatment Plan  Patient Details  Name: Jeremiah Gibson MRN: [id number] Date of Birth: 1935-09-06 Referring Provider:   Flowsheet Row Pulmonary Rehab from 11/23/2015 in [facility] Cardiac and Pulmonary Rehab  Referring Provider  Vassie Loll      Initial Encounter Date:  Flowsheet Row Pulmonary Rehab from 11/23/2015 in [facility] Cardiac and Pulmonary Rehab  Date  11/23/15  Referring Provider  Vassie Loll      Visit Diagnosis: Pulmonary fibrosis (HCC)  Patient's Home Medications on Admission:  Current Outpatient Prescriptions:    albuterol (VENTOLIN HFA) 108 (90 Base) MCG/ACT inhaler, Inhale 2 puffs into the lungs every 4 (four) hours as needed for wheezing or shortness of breath., Disp: 3 Inhaler, Rfl: 3   allopurinol (ZYLOPRIM) 100 MG tablet, Take 2 tablets (200 mg total) by mouth 2 (two) times daily., Disp: 360 tablet, Rfl: 3   amLODipine (NORVASC) 5 MG tablet, Take 1 tablet by mouth  every day for blood  pressure, Disp: 90 tablet, Rfl: 3   aspirin 81 MG tablet, Take 81 mg by mouth every evening. , Disp: , Rfl:    finasteride (PROSCAR) 5 MG tablet, Take 1 tablet by mouth  every day, Disp: 90 tablet, Rfl: 3   fish oil-omega-3 fatty acids 1000 MG capsule, Take 1 g by mouth 2 (two) times daily. , Disp: , Rfl:    Fluticasone-Salmeterol (ADVAIR DISKUS) 250-50 MCG/DOSE AEPB, Inhale 1 puff into the lungs 2 (two) times daily., Disp: 14 each, Rfl: 0   glipiZIDE (GLUCOTROL XL) 10 MG 24 hr tablet, Take 1 tablet by mouth  daily with food for  diabetes; take with largest meal of the day, Disp: 90 tablet, Rfl: 3   losartan (COZAAR) 100 MG tablet, Take 1 tablet by mouth  every day for blood  pressure, Disp: 90 tablet, Rfl: 3   Magnesium 400 MG TABS, Take 400 mg by mouth 2 (two) times daily. , Disp: , Rfl:    metFORMIN (GLUCOPHAGE) 500 MG tablet, Take 2 tablets (1,000 mg total) by mouth 2 (two) times daily with a meal., Disp: 360 tablet, Rfl: 4   MULTIPLE VITAMIN PO, MULTIVITAMINS (Oral Tablet)   1 Every Day for 0 days  Quantity: 0.00;  Refills: 0   Ordered :06-Jan-2010  [name] ;  Started 22-Mar-2009 Active, Disp: , Rfl:    Nintedanib (OFEV) 100 MG CAPS, Take 100 mg by mouth 2 (two) times daily with a meal. , Disp: , Rfl:    pantoprazole (PROTONIX) 20 MG tablet, Take 20 mg by mouth 2 (two) times daily., Disp: , Rfl:    potassium chloride SA (K-DUR,KLOR-CON) 20 MEQ tablet, Take 1 tablet by mouth  daily, Disp: 90 tablet, Rfl: 3   simvastatin (ZOCOR) 20 MG tablet, TAKE 1 TABLET(20 MG) BY MOUTH DAILY, Disp: 90 tablet, Rfl: 4   terazosin (HYTRIN) 5 MG capsule, Take 1 capsule by mouth  every day for prostate, Disp: 90 capsule, Rfl: 3   triamterene-hydrochlorothiazide (MAXZIDE-25) 37.5-25 MG tablet, Take 1 tablet by mouth  Every Day for blood  pressure, Disp: 90 tablet, Rfl: 4  Past Medical History: Past Medical History:  Diagnosis Date   Anemia    Arthritis    Asthma    Colon polyps    Cough    CHRONIC   Diabetes mellitus without complication (HCC)    Diverticulosis of colon (without mention of hemorrhage)    Dyspnea    Dysrhythmia    Esophageal reflux    Gout    Hypertension  Impotence of organic origin    Leg cramps    Pulmonary fibrosis (HCC)    Wheezing     Tobacco Use: History  Smoking Status   Former Smoker   Packs/day: 2.00   Years: 20.00   Types: Cigarettes, Pipe   Quit date: 11/22/1999  Smokeless Tobacco   Never Used    Comment: quit cigs in 72 and pipe in 01    Labs: Recent Review Flowsheet Data    Labs for ITP Cardiac and Pulmonary Rehab Latest Ref Rng & Units 02/09/2015 05/11/2015 08/10/2015 12/28/2015 04/05/2016   Cholestrol 100 - 199 mg/dL - 161 - 096 045   LDLCALC 0 - 99 mg/dL - 99 - 91 71   HDL >40 mg/dL - 42 - 98(J) 43   Trlycerides 0 - 149 mg/dL - 191(Y) - 782 956(O)   Hemoglobin A1c 4.8 - 5.6 % 7.9 7.8 7.9 8.6(H) 7.0(H)       ADL UCSD:     Pulmonary Assessment Scores    Row Name 11/23/15 1015 11/23/15  1311 01/05/16 1512     ADL UCSD   ADL Phase Entry Entry Mid   SOB Score total 8 8 32   Rest 0 0 0   Walk 0 0 1   Stairs 1 1 3    Bath 0 0 1   Dress 0 0 2   Shop 0 0 1   Row Name 03/24/16 1313 04/10/16 1601 04/10/16 1604     ADL UCSD   ADL Phase Exit Exit Entry  11/23/15 Entry Scoring   SOB Score total 25  -- 8   Rest 0  -- 0   Walk 1  -- 0   Stairs 2  -- 1   Bath 0  -- 0   Dress 1  -- 0   Shop 1  -- 0      Pulmonary Function Assessment:     Pulmonary Function Assessment - 11/23/15 1310      Initial Spirometry Results   FVC% 86 %   FEV1% 98 %   FEV1/FVC Ratio 82   Comments Test date 07/20/15     Post Bronchodilator Spirometry Results   FVC% 81 %   FEV1% 93 %   FEV1/FVC Ratio 82     Breath   Bilateral Breath Sounds Basilar;Rales   Shortness of Breath Yes      Exercise Target Goals:    Exercise Program Goal: Individual exercise prescription set with THRR, safety & activity barriers. Participant demonstrates ability to understand and report RPE using BORG scale, to self-measure pulse accurately, and to acknowledge the importance of the exercise prescription.  Exercise Prescription Goal: Starting with aerobic activity 30 plus minutes a day, 3 days per week for initial exercise prescription. Provide home exercise prescription and guidelines that participant acknowledges understanding prior to discharge.  Activity Barriers & Risk Stratification:     Activity Barriers & Cardiac Risk Stratification - 11/23/15 1309      Activity Barriers & Cardiac Risk Stratification   Activity Barriers Shortness of Breath;Deconditioning   Cardiac Risk Stratification Moderate      6 Minute Walk:     6 Minute Walk    Row Name 11/23/15 1236 01/05/16 1241 02/02/16 1049     6 Minute Walk   Phase  -- Mid Program Discharge   Distance 1320 feet 1425 feet 1476 feet   Distance % Change  -- 7.5 % 11.8 %   Walk Time 6 minutes 6 minutes  6 minutes   # of Rest Breaks  -- 0 0   MPH  2.5 2.69 2.79   METS 2.49 2.82 2.88   RPE 13 16 16    Perceived Dyspnea  3 4 4    VO2 Peak 8.7 9.8 10.1   Symptoms No No No   Resting HR 85 bpm 88 bpm 90 bpm   Resting BP 124/76 126/74 124/64   Max Ex. HR 105 bpm 113 bpm 126 bpm   Max Ex. BP 144/64 152/56 132/74   2 Minute Post BP  --  -- 134/70     Interval HR   Baseline HR 85 88 90   1 Minute HR 92 104 102   2 Minute HR 93 107  --   3 Minute HR 102  -- 124   4 Minute HR 103 113  --   5 Minute HR 103 112 126   6 Minute HR 105 112 113   2 Minute Post HR 96 97  --   Interval Heart Rate? Yes Yes Yes     Interval Oxygen   Interval Oxygen? Yes Yes  --   Baseline Oxygen Saturation % 97 % 94 % 94 %   1 Minute Oxygen Saturation % 93 % 94 % 94 %   2 Minute Oxygen Saturation % 88 % 94 % 94 %   3 Minute Oxygen Saturation % 89 %  -- 94 %   4 Minute Oxygen Saturation % 90 % 94 % 94 %   5 Minute Oxygen Saturation % 91 % 94 % 94 %   6 Minute Oxygen Saturation % 93 % 94 % 94 %   2 Minute Post Oxygen Saturation % 96 % 94 % 96 %      Initial Exercise Prescription:     Initial Exercise Prescription - 11/23/15 1200      Date of Initial Exercise RX and Referring Provider   Date 11/23/15   Referring Provider Vassie Loll     Treadmill   MPH 1.7   Grade 0.5   Minutes 15   METs 2.42     Recumbant Bike   Level 1   RPM 60   Minutes 15   METs 2.3     NuStep   Level 2   Minutes 15   METs 2     T5 Nustep   Level 1   Minutes 15   METs 2     Biostep-RELP   Level 2   Minutes 15   METs 2     Prescription Details   Frequency (times per week) 3   Duration Progress to 45 minutes of aerobic exercise without signs/symptoms of physical distress     Intensity   THRR 40-80% of Max Heartrate 107-130   Ratings of Perceived Exertion 11-13   Perceived Dyspnea 0-4     Progression   Progression Continue to progress workloads to maintain intensity without signs/symptoms of physical distress.     Resistance Training   Training Prescription  Yes   Weight 2   Reps 10-15      Perform Capillary Blood Glucose checks as needed.  Exercise Prescription Changes:     Exercise Prescription Changes    Row Name 11/29/15 1400 12/08/15 1300 12/22/15 1500 01/05/16 1400 01/18/16 1400     Exercise Review   Progression  -- Yes Yes Yes Yes     Response to Exercise   Blood Pressure (Admit) 120/70 120/62 136/76 126/74 126/74   Blood  Pressure (Exercise) 134/82 148/82 126/64 152/86 128/66   Blood Pressure (Exit) 138/64 134/78 116/64 126/66 144/82   Heart Rate (Admit) 76 bpm 94 bpm 95 bpm 94 bpm 82 bpm   Heart Rate (Exercise) 98 bpm 94 bpm 115 bpm 95 bpm 83 bpm   Heart Rate (Exit) 74 bpm 88 bpm 86 bpm 98 bpm 78 bpm   Oxygen Saturation (Admit) 97 % 95 % 97 % 88 % 97 %   Oxygen Saturation (Exercise) 97 % 95 % 90 % 96 % 96 %   Oxygen Saturation (Exit) 98 % 96 % 97 % 98 % 97 %   Rating of Perceived Exertion (Exercise) 15 13 17 17 17    Perceived Dyspnea (Exercise) 4 3 5 4 4    Symptoms none none none none none   Duration Progress to 45 minutes of aerobic exercise without signs/symptoms of physical distress Progress to 45 minutes of aerobic exercise without signs/symptoms of physical distress Progress to 45 minutes of aerobic exercise without signs/symptoms of physical distress Progress to 45 minutes of aerobic exercise without signs/symptoms of physical distress Progress to 45 minutes of aerobic exercise without signs/symptoms of physical distress   Intensity THRR unchanged THRR unchanged THRR unchanged THRR unchanged THRR unchanged     Progression   Progression Continue to progress workloads to maintain intensity without signs/symptoms of physical distress. Continue to progress workloads to maintain intensity without signs/symptoms of physical distress. Continue to progress workloads to maintain intensity without signs/symptoms of physical distress. Continue to progress workloads to maintain intensity without signs/symptoms of physical distress.  Continue to progress workloads to maintain intensity without signs/symptoms of physical distress.   Average METs  --  -- 2.05 2.2 2     Resistance Training   Training Prescription Yes Yes Yes Yes Yes   Weight 4 4 4  lbs 4 lbs 4 lbs   Reps 10-15 10-15 10-12 10-12 10-12     Interval Training   Interval Training No No No No No     Treadmill   MPH 1  --  --  --  --   Grade 0.5  --  --  --  --   Minutes 10  5/5  --  --  --  --     Arm/Foot Ergometer   Level  -- 4 4 4  4.5   Watts  --  --  -- --  50 rpm  --   Minutes  -- 15 15 15 15      T5 Nustep   Level 2 2 4 5   80 spm 5   Minutes 15 15 15 15 15    METs  -- 2 2.1 2.3 2     Biostep-RELP   Level 2 2 4 5   40 spm 6   Minutes 15 15 15 15 15    METs  -- 2 2 2 2    Row Name 01/19/16 1500 02/01/16 1500 03/28/16 1400 04/12/16 1500       Exercise Review   Progression Yes Yes Yes Yes      Response to Exercise   Blood Pressure (Admit)  -- 116/70 130/80 114/68    Blood Pressure (Exercise)  -- 148/68 134/68 132/80    Blood Pressure (Exit)  -- 124/70 132/78 130/80    Heart Rate (Admit)  -- 90 bpm 87 bpm 82 bpm    Heart Rate (Exercise)  -- 92 bpm 84 bpm 91 bpm    Heart Rate (Exit)  -- 84 bpm 86 bpm 79 bpm  Oxygen Saturation (Admit)  -- 96 % 98 % 93 %    Oxygen Saturation (Exercise)  -- 96 % 89 % 95 %    Oxygen Saturation (Exit)  -- 97 % 97 % 97 %    Rating of Perceived Exertion (Exercise)  -- 16 16 16     Perceived Dyspnea (Exercise)  -- 4 5 3     Symptoms none none none none    Comments Home Exercise Guidelines given 01/19/16 Home Exercise Guidelines given 01/19/16 Home Exercise Guidelines given 01/19/16 Home Exercise Guidelines given 01/19/16    Duration Progress to 45 minutes of aerobic exercise without signs/symptoms of physical distress Progress to 45 minutes of aerobic exercise without signs/symptoms of physical distress Progress to 45 minutes of aerobic exercise without signs/symptoms of physical distress Progress to 45 minutes of  aerobic exercise without signs/symptoms of physical distress    Intensity THRR unchanged THRR unchanged THRR unchanged THRR unchanged      Progression   Progression Continue to progress workloads to maintain intensity without signs/symptoms of physical distress. Continue to progress workloads to maintain intensity without signs/symptoms of physical distress. Continue to progress workloads to maintain intensity without signs/symptoms of physical distress. Continue to progress workloads to maintain intensity without signs/symptoms of physical distress.    Average METs 2 2.1 2.1 2      Resistance Training   Training Prescription Yes Yes --  weights on hold until next follow up appt Yes    Weight 4 lbs 4 lbs  -- 4 lbs    Reps 10-12 10-12  -- 10-15      Interval Training   Interval Training No No No No      Arm/Foot Ergometer   Level 4.5 4.5 4.5 4.5    Watts  -- --  40 rpm  --  --    Minutes 15 15 15 15       T5 Nustep   Level 5 5 5 5     Minutes 15 15 15 15     METs 2 2.2 2.1 2.1      Biostep-RELP   Level 6 6 6 7     Minutes 15 15 15 15     METs 2 2 2 2       Home Exercise Plan   Plans to continue exercise at [employer] (comment)  walking and [employer] ([employer] (comment)  walking and [employer] (comment)  walking and [employer] (comment)  walking and [employer])    Frequency Add 2 additional days to program exercise sessions. Add 2 additional days to program exercise sessions. Add 2 additional days to program exercise sessions. Add 2 additional days to program exercise sessions.       Exercise Comments:     Exercise Comments    Row Name 11/29/15 1503 12/08/15 1313 12/22/15 1554 01/05/16 1450 01/18/16 1416   Exercise Comments Mr Mcclafferty did well his first day of exercise.  Exercise equipment safety, infection control, and diabetes, high and low blood sugar signs were reveiwed.  Easton is progressing well with exercise. Fontaine is making good progress in rehab.  He is now up to level 4 on T5 and BioStep.  We wll continue to monitor his progression. Kewon completed his mid 6 min walk test today.  He has already impoved by 105 ft (7.5%)!!  We will continue to monitor his progress. Macaulay continues to do well with exercise. He is already over half way through  the program.  We will continue to monitor his progression.   Row Name 01/19/16 1548 02/01/16 1541 03/01/16 1531 03/24/16 1205 03/28/16 1445   Exercise Comments Reviewed home exercise with pt today.  Pt plans to walk and go to [facility] with his [employer] for exercise.  Reviewed THR, pulse, RPE, sign and symptoms, and when to call 911 or MD.  Also discussed weather considerations and indoor options.  Pt voiced understanding.    Yedidya continues to do well.  We will continue to watch for progression. Christoffer has continued to do well in rehab.  We will continue to monitor his progression. Umer has not attended since last review due to cataract surgery.  Will resume when cleared. Franciscojavier brought in his physician clearance to return to [facility].  He may exercise (bike/walk).  NO lifting weights or "floor work" until further check up and notice.  Vincenet has done well since returning to [facility].  He was able to return to his former workloads without too much difficulty.  We will continue to monitor his progression.   Row Name 04/12/16 1532           Exercise Comments Aleksandr continues to do well in rehab.  He has three more sessions until graduation.  We will continue to monitor his progression until then!          Discharge Exercise Prescription (Final Exercise Prescription Changes):     Exercise Prescription Changes - 04/12/16 1500      Exercise Review   Progression Yes     Response to Exercise   Blood Pressure (Admit) 114/68   Blood Pressure (Exercise) 132/80   Blood Pressure (Exit) 130/80   Heart Rate  (Admit) 82 bpm   Heart Rate (Exercise) 91 bpm   Heart Rate (Exit) 79 bpm   Oxygen Saturation (Admit) 93 %   Oxygen Saturation (Exercise) 95 %   Oxygen Saturation (Exit) 97 %   Rating of Perceived Exertion (Exercise) 16   Perceived Dyspnea (Exercise) 3   Symptoms none   Comments Home Exercise Guidelines given 01/19/16   Duration Progress to 45 minutes of aerobic exercise without signs/symptoms of physical distress   Intensity THRR unchanged     Progression   Progression Continue to progress workloads to maintain intensity without signs/symptoms of physical distress.   Average METs 2     Resistance Training   Training Prescription Yes   Weight 4 lbs   Reps 10-15     Interval Training   Interval Training No     Arm/Foot Ergometer   Level 4.5   Minutes 15     T5 Nustep   Level 5   Minutes 15   METs 2.1     Biostep-RELP   Level 7   Minutes 15   METs 2     Home Exercise Plan   Plans to continue exercise at [employer] (comment)  walking and [employer] ([employer])   Frequency Add 2 additional days to program exercise sessions.       Nutrition:  Target Goals: Understanding of nutrition guidelines, daily intake of sodium 1500mg , cholesterol 200mg , calories 30% from fat and 7% or less from saturated fats, daily to have 5 or more servings of fruits and vegetables.  Biometrics:     Pre Biometrics - 11/23/15 1234      Pre Biometrics   Height 5\' 10"  (1.778 m)   Weight 214 lb 8 oz (97.3 kg)   Waist Circumference 44 inches  Hip Circumference 43 inches   Waist to Hip Ratio 1.02 %   BMI (Calculated) 30.8         Post Biometrics - 02/02/16 1048       Post  Biometrics   Height 5\' 10"  (1.778 m)   Weight 213 lb (96.6 kg)   Waist Circumference 43 inches   Hip Circumference 43 inches   Waist to Hip Ratio 1 %   BMI (Calculated) 30.6      Nutrition Therapy Plan and Nutrition Goals:   Nutrition Discharge: Rate Your Plate  Scores:   Psychosocial: Target Goals: Acknowledge presence or absence of depression, maximize coping skills, provide positive support system. Participant is able to verbalize types and ability to use techniques and skills needed for reducing stress and depression.  Initial Review & Psychosocial Screening:     Initial Psych Review & Screening - 11/23/15 1332      Family Dynamics   Good Support System? Yes   Comments Mr Naas has great support from his wife. He has accepted his pulmonary fibrosis and is very positive about managing the disease.     Barriers   Psychosocial barriers to participate in program The patient should benefit from training in stress management and relaxation.     Screening Interventions   Interventions Encouraged to exercise      Quality of Life Scores:     Quality of Life - 03/24/16 1228      Quality of Life Scores   Health/Function Pre 20.19 %   Health/Function Post 19.93 %   Health/Function % Change -1.29 %   Socioeconomic Pre 20.4 %   Socioeconomic Post 20.44 %   Socioeconomic % Change  0.2 %   Psych/Spiritual Pre 20.29 %   Psych/Spiritual Post 28.79 %   Psych/Spiritual % Change 41.89 %   Family Pre 20 %   Family Post 22.9 %   Family % Change 14.5 %   GLOBAL Pre 20.21 %   GLOBAL Post 22.24 %   GLOBAL % Change 10.04 %      PHQ-9: Recent Review Flowsheet Data    Depression screen [facility] 2/9 04/10/2016 12/28/2015 11/23/2015   Decreased Interest 0 0 0   Down, Depressed, Hopeless 0 0 0   PHQ - 2 Score 0 0 0   Altered sleeping 0 - 0   Tired, decreased energy 0 - 0   Change in appetite 0 - 0   Feeling bad or failure about yourself  0 - 0   Trouble concentrating 0 - 0   Moving slowly or fidgety/restless 0 - 0   Suicidal thoughts 0 - 0   PHQ-9 Score 0 - 0      Psychosocial Evaluation and Intervention:     Psychosocial Evaluation - 04/10/16 1230      Discharge Psychosocial Assessment & Intervention   Comments Counselor follow up with  Mr. Harpster prior to discharge in a few more classes.  He reports feeling stronger since coming into this program.  Also, his family have notices he is not coughing as much; which is positive.  Mr. B states he is sleeping well and continues to have minimal stress in his life; other than his health and he realizes he is doing his part to improve or maintain that.  Counselor commended Mr. B for her hard work; progress made and his commitment to consistent exercise by [facility] [employer] [facility]  Psychosocial Re-Evaluation:     Psychosocial Re-Evaluation    Row Name 01/26/16 1147 02/29/16 1523           Psychosocial Re-Evaluation   Comments Counselor follow up with Mr. B today reporting he is feeling stronger and his legs are especially stronger since coming into this program.  He is sleeping well and continues to have a good appetite.  He states his breathing is "about the same."  Mr. B reports the stress in his life is manageable and he continues to have a positive attitude and enjoys coming to these classes.   Mr Gangemi last attended 02/02/16 due to cataract surgery. I called him today, and he reports the first eye surgery went well and the second eye will be operated on 03/21/16. He is in very good spirits and is looking forward to returning to [facility]..        Education: Education Goals: Education classes will be provided on a weekly basis, covering required topics. Participant will state understanding/return demonstration of topics presented.  Learning Barriers/Preferences:     Learning Barriers/Preferences - 11/23/15 1310      Learning Barriers/Preferences   Learning Barriers None   Learning Preferences Group Instruction;Individual Instruction;Pictoral;Skilled Demonstration;Verbal Instruction;Video;Written Material      Education Topics: Initial Evaluation Education: - Verbal, written and demonstration of respiratory meds,  RPE/PD scales, oximetry and breathing techniques. Instruction on use of nebulizers and MDIs: cleaning and proper use, rinsing mouth with steroid doses and importance of monitoring MDI activations. Flowsheet Row Pulmonary Rehab from 04/19/2016 in [facility] Cardiac and Pulmonary Rehab  Date  11/23/15  Educator  LB  Instruction Review Code  2- meets goals/outcomes      General Nutrition Guidelines/Fats and Fiber: -Group instruction provided by verbal, written material, models and posters to present the general guidelines for heart healthy nutrition. Gives an explanation and review of dietary fats and fiber. Flowsheet Row Pulmonary Rehab from 04/19/2016 in [facility] Cardiac and Pulmonary Rehab  Date  04/01/16  Educator  CR  Instruction Review Code  2- meets goals/outcomes      Controlling Sodium/Reading Food Labels: -Group verbal and written material supporting the discussion of sodium use in heart healthy nutrition. Review and explanation with models, verbal and written materials for utilization of the food label. Flowsheet Row Pulmonary Rehab from 04/19/2016 in [facility] Cardiac and Pulmonary Rehab  Date  04/03/16  Educator  CR  Instruction Review Code  2- meets goals/outcomes      Exercise Physiology & Risk Factors: - Group verbal and written instruction with models to review the exercise physiology of the cardiovascular system and associated critical values. Details cardiovascular disease risk factors and the goals associated with each risk factor. Flowsheet Row Pulmonary Rehab from 04/19/2016 in [facility] Cardiac and Pulmonary Rehab  Date  01/26/16  Educator  AS  Instruction Review Code  2- meets goals/outcomes      Aerobic Exercise & Resistance Training: - Gives group verbal and written discussion on the health impact of inactivity. On the components of aerobic and resistive training programs and the benefits of this training and how to safely progress through these programs. Flowsheet Row Pulmonary Rehab  from 04/19/2016 in [facility] Cardiac and Pulmonary Rehab  Date  12/01/15  Educator  Roney Jaffe  Instruction Review Code  2- meets goals/outcomes      Flexibility, Balance, General Exercise Guidelines: - Provides group verbal and written instruction on the benefits of flexibility and balance training programs. Provides general exercise guidelines with  specific guidelines to those with heart or lung disease. Demonstration and skill practice provided.   Stress Management: - Provides group verbal and written instruction about the health risks of elevated stress, cause of high stress, and healthy ways to reduce stress.   Depression: - Provides group verbal and written instruction on the correlation between heart/lung disease and depressed mood, treatment options, and the stigmas associated with seeking treatment.   Exercise & Equipment Safety: - Individual verbal instruction and demonstration of equipment use and safety with use of the equipment. Flowsheet Row Pulmonary Rehab from 04/19/2016 in [facility] Cardiac and Pulmonary Rehab  Date  11/29/15  Educator  AS  Instruction Review Code  2- meets goals/outcomes      Infection Prevention: - Provides verbal and written material to individual with discussion of infection control including proper hand washing and proper equipment cleaning during exercise session. Flowsheet Row Pulmonary Rehab from 04/19/2016 in [facility] Cardiac and Pulmonary Rehab  Date  11/29/15  Educator  AS  Instruction Review Code  2- meets goals/outcomes      Falls Prevention: - Provides verbal and written material to individual with discussion of falls prevention and safety. Flowsheet Row Pulmonary Rehab from 04/19/2016 in [facility] Cardiac and Pulmonary Rehab  Date  11/23/15  Educator  LB  Instruction Review Code  2- meets goals/outcomes      Diabetes: - Individual verbal and written instruction to review signs/symptoms of diabetes, desired ranges of glucose level fasting, after  meals and with exercise. Advice that pre and post exercise glucose checks will be done for 3 sessions at entry of program. Flowsheet Row Pulmonary Rehab from 04/19/2016 in [facility] Cardiac and Pulmonary Rehab  Date  11/29/15  Educator  AS  Instruction Review Code  2- meets goals/outcomes      Chronic Lung Diseases: - Group verbal and written instruction to review new updates, new respiratory medications, new advancements in procedures and treatments. Provide informative websites and "800" numbers of self-education. Flowsheet Row Pulmonary Rehab from 04/19/2016 in [facility] Cardiac and Pulmonary Rehab  Date  12/27/15  Educator  LB  Instruction Review Code  2- meets goals/outcomes      Lung Procedures: - Group verbal and written instruction to describe testing methods done to diagnose lung disease. Review the outcome of test results. Describe the treatment choices: Pulmonary Function Tests, ABGs and oximetry.   Energy Conservation: - Provide group verbal and written instruction for methods to conserve energy, plan and organize activities. Instruct on pacing techniques, use of adaptive equipment and posture/positioning to relieve shortness of breath.   Triggers: - Group verbal and written instruction to review types of environmental controls: home humidity, furnaces, filters, dust mite/pet prevention, HEPA vacuums. To discuss weather changes, air quality and the benefits of nasal washing.   Exacerbations: - Group verbal and written instruction to provide: warning signs, infection symptoms, calling MD promptly, preventive modes, and value of vaccinations. Review: effective airway clearance, coughing and/or vibration techniques. Create an Sport and exercise psychologist.   Oxygen: - Individual and group verbal and written instruction on oxygen therapy. Includes supplement oxygen, available portable oxygen systems, continuous and intermittent flow rates, oxygen safety, concentrators, and Medicare reimbursement for  oxygen.   Respiratory Medications: - Group verbal and written instruction to review medications for lung disease. Drug class, frequency, complications, importance of spacers, rinsing mouth after steroid MDI's, and proper cleaning methods for nebulizers. Flowsheet Row Pulmonary Rehab from 04/19/2016 in [facility] Cardiac and Pulmonary Rehab  Date  11/23/15  Educator  LB  Instruction Review Code  2- meets goals/outcomes      AED/CPR: - Group verbal and written instruction with the use of models to demonstrate the basic use of the AED with the basic ABC's of resuscitation. Flowsheet Row Pulmonary Rehab from 04/19/2016 in [facility] Cardiac and Pulmonary Rehab  Date  01/21/16  Educator  CE  Instruction Review Code  2- meets goals/outcomes      Breathing Retraining: - Provides individuals verbal and written instruction on purpose, frequency, and proper technique of diaphragmatic breathing and pursed-lipped breathing. Applies individual practice skills. Flowsheet Row Pulmonary Rehab from 04/19/2016 in [facility] Cardiac and Pulmonary Rehab  Date  11/23/15  Educator  LB  Instruction Review Code  2- meets goals/outcomes      Anatomy and Physiology of the Lungs: - Group verbal and written instruction with the use of models to provide basic lung anatomy and physiology related to function, structure and complications of lung disease.   Heart Failure: - Group verbal and written instruction on the basics of heart failure: signs/symptoms, treatments, explanation of ejection fraction, enlarged heart and cardiomyopathy.   Sleep Apnea: - Individual verbal and written instruction to review Obstructive Sleep Apnea. Review of risk factors, methods for diagnosing and types of masks and machines for OSA.   Anxiety: - Provides group, verbal and written instruction on the correlation between heart/lung disease and anxiety, treatment options, and management of anxiety. Flowsheet Row Pulmonary Rehab from 04/19/2016 in [facility]  Cardiac and Pulmonary Rehab  Date  12/15/15  Educator  [facility]  Instruction Review Code  2- Meets goals/outcomes      Relaxation: - Provides group, verbal and written instruction about the benefits of relaxation for patients with heart/lung disease. Also provides patients with examples of relaxation techniques. Flowsheet Row Pulmonary Rehab from 04/19/2016 in [facility] Cardiac and Pulmonary Rehab  Date  04/19/16  Educator  [facility]  Instruction Review Code  2- Meets goals/outcomes      Knowledge Questionnaire Score:     Knowledge Questionnaire Score - 03/24/16 1230      Knowledge Questionnaire Score   Post Score 9/10       Core Components/Risk Factors/Patient Goals at Admission:     Personal Goals and Risk Factors at Admission - 11/23/15 1319      Core Components/Risk Factors/Patient Goals on Admission    Weight Management Yes   Intervention Weight Management: Develop a combined nutrition and exercise program designed to reach desired caloric intake, while maintaining appropriate intake of nutrient and fiber, sodium and fats, and appropriate energy expenditure required for the weight goal.  Mr Hurtado prefers not to meet with the dietitian. His wife is a good , healthy cook. He does like bread, but not sweets.   Admit Weight 214 lb 6.4 oz (97.3 kg)   Goal Weight: Short Term 209 lb (94.8 kg)   Goal Weight: Long Term 204 lb (92.5 kg)   Expected Outcomes Understanding of distribution of calorie intake throughout the day with the consumption of 4-5 meals/snacks   Sedentary Yes   Intervention Provide advice, education, support and counseling about physical activity/exercise needs.;Develop an individualized exercise prescription for aerobic and resistive training based on initial evaluation findings, risk stratification, comorbidities and participant's personal goals.  He does the Silver Sneakers class twice a week..   Expected Outcomes Achievement of increased cardiorespiratory fitness and enhanced  flexibility, muscular endurance and strength shown through measurements of functional capacity and personal statement of participant.   Increase Strength and Stamina  Yes   Intervention Provide advice, education, support and counseling about physical activity/exercise needs.;Develop an individualized exercise prescription for aerobic and resistive training based on initial evaluation findings, risk stratification, comorbidities and participant's personal goals.   Expected Outcomes Achievement of increased cardiorespiratory fitness and enhanced flexibility, muscular endurance and strength shown through measurements of functional capacity and personal statement of participant.   Improve shortness of breath with ADL's Yes   Intervention Provide education, individualized exercise plan and daily activity instruction to help decrease symptoms of SOB with activities of daily living.   Expected Outcomes Short Term: Achieves a reduction of symptoms when performing activities of daily living.   Develop more efficient breathing techniques such as purse lipped breathing and diaphragmatic breathing; and practicing self-pacing with activity Yes   Intervention Provide education, demonstration and support about specific breathing techniuqes utilized for more efficient breathing. Include techniques such as pursed lipped breathing, diaphragmatic breathing and self-pacing activity.   Expected Outcomes Short Term: Participant will be able to demonstrate and use breathing techniques as needed throughout daily activities.   Increase knowledge of respiratory medications and ability to use respiratory devices properly  Yes   Intervention Provide education and demonstration as needed of appropriate use of medications, inhalers, and oxygen therapy.  Advair and Proventil; spacer given   Expected Outcomes Short Term: Achieves understanding of medications use. Understands that oxygen is a medication prescribed by physician.  Demonstrates appropriate use of inhaler and oxygen therapy.   Diabetes Yes   Intervention Provide education about signs/symptoms and action to take for hypo/hyperglycemia.;Provide education about proper nutrition, including hydration, and aerobic/resistive exercise prescription along with prescribed medications to achieve blood glucose in normal ranges: Fasting glucose 65-99 mg/dL   Expected Outcomes Short Term: Participant verbalizes understanding of the signs/symptoms and immediate care of hyper/hypoglycemia, proper foot care and importance of medication, aerobic/resistive exercise and nutrition plan for blood glucose control.;Long Term: Attainment of HbA1C < 7%.   Hypertension Yes   Intervention Provide education on lifestyle modifcations including regular physical activity/exercise, weight management, moderate sodium restriction and increased consumption of fresh fruit, vegetables, and low fat dairy, alcohol moderation, and smoking cessation.;Monitor prescription use compliance.   Expected Outcomes Short Term: Continued assessment and intervention until BP is < 140/33mm HG in hypertensive participants. < 130/39mm HG in hypertensive participants with diabetes, heart failure or chronic kidney disease.;Long Term: Maintenance of blood pressure at goal levels.      Core Components/Risk Factors/Patient Goals Review:      Goals and Risk Factor Review    Row Name 12/01/15 1243 12/10/15 1250 12/10/15 1252 12/10/15 1253 12/29/15 1157     Core Components/Risk Factors/Patient Goals Review   Personal Goals Review Weight Management/Obesity;Sedentary;Increase Strength and Stamina;Hypertension;Diabetes Increase knowledge of respiratory medications and ability to use respiratory devices properly. Improve shortness of breath with ADL's Develop more efficient breathing techniques such as purse lipped breathing and diaphragmatic breathing and practicing self-pacing with activity. Weight  Management/Obesity;Sedentary;Diabetes;Hypertension   Review Brallan had a good first day of exercise.  He was doing silver sneakers before, but not while in program.  His blood pressure was good here, but he does not check it at home.  His blood sugars have been good at home and here. He was down 1/2 lb today! Mr. Carn takes an Albuterol MDI and Advair at home for his breathing.  He has a spacer for the albuterol and knows to rinse his mouth after taking the Advair. Mr. Kunin has not seen any improventment in  his SOB.  He is only on his seventh session. Mr. Shon BatonBrooks demonstrates proper technique for PLB. Oswaldo DoneVincent has not lost weight but is maintaining his current weight.  He consistently attends class 3 days per week and has not yet missed a session.  He never had trouble with ADLs excpet walking up hills or stairs.  Bp has been within normal limits when he comes to class.Marland Kitchen.  His a1c level was higher yesterday at the Dr but he feels it may be due to a new medication he takes for pukmonary fibrosis.  He will follow up with his Dr about this.   Expected Outcomes Oswaldo DoneVincent will continue to come to exercise and education classes for risk factor modification.  We continue to monitor for progression. Taking his respiratory medications correctly will help reduces SOB and exacerbations.  Continued cardio and strength training should start to help Mr. Shon BatonBrooks improve on his SOB and make ADLs easier for him. PLB will help Mr. Shon BatonBrooks regain his breath during exercise and episodes of SOB.  Oswaldo DoneVincent will maintain his attendance and see his weight stay the same or go down.  His other vitals - BP and BG will remain well controlled.   Row Name 01/05/16 1515 01/17/16 1449 04/10/16 1544         Core Components/Risk Factors/Patient Goals Review   Personal Goals Review Increase Strength and Stamina;Increase knowledge of respiratory medications and ability to use respiratory devices properly.;Develop more efficient breathing  techniques such as purse lipped breathing and diaphragmatic breathing and practicing self-pacing with activity. Sedentary;Increase Strength and Stamina;Improve shortness of breath with ADL's;Develop more efficient breathing techniques such as purse lipped breathing and diaphragmatic breathing and practicing self-pacing with activity.;Increase knowledge of respiratory medications and ability to use respiratory devices properly.;Diabetes;Hypertension;Weight Management/Obesity Sedentary;Increase Strength and Stamina;Improve shortness of breath with ADL's;Develop more efficient breathing techniques such as purse lipped breathing and diaphragmatic breathing and practicing self-pacing with activity.;Increase knowledge of respiratory medications and ability to use respiratory devices properly.;Diabetes;Hypertension     Review Mr Shon BatonBrooks has a good understanding of his MDI's Advair and Proventil. He is taking OFEV for his Pulmonary Fibrosis and has tolerated the medicine well. Mr Shon BatonBrooks uses PLB with his exercise goals and activities at home. His mid 6mwd improved 15505ft - minimal importance difference for IPF is 78.72-11447ft. Reviewed Mr Shon BatonBrooks goals today. He is maintaining his weight at 214-215lbs and is okay with this. his wife cooks healthy, no salt or sweets, but does like bread. With his history of Asthma, he takes Advair and Proventil, but is recently using a sample from Dr Vassie LollAlva - [facility]. Cost of inhalers are a concern, so to have a sample is worth using for a month and possibly changing to due to cost. His Diabetes and blood pressures have not been a concern in [facility] and Mr Shon BatonBrooks is compliant with his medicine. His last A1C, was 8.0 and a medicine change was made to his metformin . After [facility], [facility] will [facility] in the 2 Silver Sneakers classes and add another day of exercise with the machines or [facility]. Mr Shon BatonBrooks will be graduating in 3 sessions. He has increased his exercise  goals and increased his post 6mwd by 15805ft.  The Minimal Importance Difference for IPF is 78.72 to 147.746ft. His plans for exercise will be to return to the [facility] and perform the Silver Sneaker group exercise classes. He does want to incorporate at least one day/week on the exercise  equipment. Mr Keilman has a good understanding of his MDI's. He is currently using a Breo sample, but he does prefer the Advair. He is compliant with his medications and had a good AIC of 7.0 from his last reading of 8.0. He has good PLB technique and his shortness of breath has been mild with PD's 3-4. Mr Aultman attended regularly and enjoyed the education.       Expected Outcomes  --  -- Continue exercising and continue self-management of his Pulmonary Fibrosis by applying the knowledge he learned in [facility].        Core Components/Risk Factors/Patient Goals at Discharge (Final Review):      Goals and Risk Factor Review - 04/10/16 1544      Core Components/Risk Factors/Patient Goals Review   Personal Goals Review Sedentary;Increase Strength and Stamina;Improve shortness of breath with ADL's;Develop more efficient breathing techniques such as purse lipped breathing and diaphragmatic breathing and practicing self-pacing with activity.;Increase knowledge of respiratory medications and ability to use respiratory devices properly.;Diabetes;Hypertension   Review Mr Haji will be graduating in 3 sessions. He has increased his exercise goals and increased his post by 136ft.  The Minimal Importance Difference for IPF is 78.72 to 147.83ft. His plans for exercise will be to return to the [facility] and perform the Silver Sneaker group exercise classes. He does want to incorporate at least one day/week on the exercise equipment. Mr Aschoff has a good understanding of his MDI's. He is currently using a Breo sample, but he does prefer the Advair. He is compliant with his medications and had a good AIC of 7.0 from his last reading of 8.0. He  has good PLB technique and his shortness of breath has been mild with PD's 3-4. Mr Stocks attended regularly and enjoyed the education.     Expected Outcomes Continue exercising and continue self-management of his Pulmonary Fibrosis by applying the knowledge he learned in [facility].      ITP Comments:     ITP Comments    Row Name 12/08/15 1313 02/02/16 1220 02/29/16 1522 04/14/16 1132     ITP Comments Abayomi is progressing well with exercise. Huel is scheduled for cataract surgery on 02/08/2016 and will be unable to attend the next several sessions. Called Mr Yankee to check on his cataract surgeries. His second eye will be operated on 03/21/16. Mr Papadopoulos want to come back to [facility] when he is cleared for exercise.  Attended [employer] Education Class       Comments:  Levern graduated today from pulmonary rehab with 36 sessions completed.  Details of the patient's exercise prescription and what He needs to do in order to continue the prescription and progress were discussed with patient.  Patient was given a copy of prescription and goals.  Patient verbalized understanding.  Izik plans to continue to exercise by participating in the Silver Sneaker classes at the [facility] and the [facility] exercise equipment at least one day/week.

## 2016-04-24 NOTE — Progress Notes (Signed)
Daily Session Note  Patient Details  Name: Jeremiah Gibson MRN: [id number] Date of Birth: 06-12-1935 Referring Provider:   Flowsheet Row Pulmonary Rehab from 11/23/2015 in [facility] Cardiac and Pulmonary Rehab  Referring Provider  [name]      Encounter Date: 04/24/2016  Check In:     Session Check In - 04/24/16 1347      Check-In   Location [facility]-Cardiac & Pulmonary Rehab   Staff Present Nyoka Cowden, RN, BSN, Kela Millin, BA, ACSM CEP, Exercise Physiologist;Laureen Janell Quiet, RRT, Respiratory Therapist   Supervising physician immediately available to respond to emergencies [facility] immediately available ER MD   Physician(s) Jacqualine Code and Mariea Clonts   Medication changes reported     No   Fall or balance concerns reported    No   Warm-up and Cool-down Performed as group-led instruction   Resistance Training Performed Yes   VAD Patient? No     Pain Assessment   Currently in Pain? No/denies         Goals Met:  Proper associated with RPD/PD & O2 Sat Independence with exercise equipment Exercise tolerated well Strength training completed today  Goals Unmet:  Not Applicable  Comments:  Jeremiah Gibson graduated today from pulmonary rehab with 36 sessions completed.  Details of the patient's exercise prescription and what he needs to do in order to continue the prescription and progress were discussed with patient.  Patient was given a copy of prescription and goals.  Patient verbalized understanding.  [name] plans to continue to exercise by going to the [facility].    Dr. Emily Filbert is Medical Director for [place] and [facility] Pulmonary Rehabilitation.

## 2016-04-25 ENCOUNTER — Encounter: Payer: Self-pay | Admitting: *Deleted

## 2016-04-27 ENCOUNTER — Encounter: Payer: Self-pay | Admitting: Family Medicine

## 2016-05-29 ENCOUNTER — Other Ambulatory Visit: Payer: Self-pay

## 2016-05-29 DIAGNOSIS — M109 Gout, unspecified: Secondary | ICD-10-CM

## 2016-05-29 DIAGNOSIS — I10 Essential (primary) hypertension: Secondary | ICD-10-CM

## 2016-05-29 DIAGNOSIS — N400 Enlarged prostate without lower urinary tract symptoms: Secondary | ICD-10-CM

## 2016-05-29 MED ORDER — TERAZOSIN HCL 5 MG PO CAPS: ORAL_CAPSULE | ORAL | 4 refills | Status: DC

## 2016-05-29 MED ORDER — GLIPIZIDE ER 10 MG PO TB24: ORAL_TABLET | ORAL | 4 refills | Status: DC

## 2016-05-29 MED ORDER — FINASTERIDE 5 MG PO TABS: ORAL_TABLET | ORAL | 4 refills | Status: DC

## 2016-05-29 MED ORDER — ALLOPURINOL 100 MG PO TABS: 200.0000 mg | ORAL_TABLET | Freq: Two times a day (BID) | ORAL | 4 refills | Status: DC

## 2016-05-29 MED ORDER — LOSARTAN POTASSIUM 100 MG PO TABS: ORAL_TABLET | ORAL | 4 refills | Status: DC

## 2016-05-29 NOTE — Telephone Encounter (Signed)
Mail order pharmacy requesting refills. Thanks!  

## 2016-05-30 ENCOUNTER — Other Ambulatory Visit: Payer: Self-pay | Admitting: *Deleted

## 2016-05-30 DIAGNOSIS — I10 Essential (primary) hypertension: Secondary | ICD-10-CM

## 2016-05-30 MED ORDER — AMLODIPINE BESYLATE 5 MG PO TABS: ORAL_TABLET | ORAL | 4 refills | Status: DC

## 2016-06-16 ENCOUNTER — Other Ambulatory Visit: Payer: Self-pay

## 2016-06-16 MED ORDER — FLUTICASONE-SALMETEROL 250-50 MCG/DOSE IN AEPB: 1.0000 | INHALATION_SPRAY | Freq: Two times a day (BID) | RESPIRATORY_TRACT | 0 refills | Status: DC

## 2016-06-16 NOTE — Telephone Encounter (Signed)
Mail order pharmacy requesting refills. Thanks! Formerly a Dr. Elease Hashimoto patient that now sees Dr. Sherrie Mustache.

## 2016-06-21 ENCOUNTER — Telehealth: Payer: Self-pay | Admitting: Pulmonary Disease

## 2016-06-21 MED ORDER — ALBUTEROL SULFATE HFA 108 (90 BASE) MCG/ACT IN AERS: 2.0000 | INHALATION_SPRAY | RESPIRATORY_TRACT | 1 refills | Status: DC | PRN

## 2016-06-21 MED ORDER — FLUTICASONE-SALMETEROL 250-50 MCG/DOSE IN AEPB: 1.0000 | INHALATION_SPRAY | Freq: Two times a day (BID) | RESPIRATORY_TRACT | 1 refills | Status: DC

## 2016-06-21 NOTE — Telephone Encounter (Signed)
Called and spoke with pt and he is aware of refill that has been sent to the pharmacy per his request.

## 2016-06-29 ENCOUNTER — Other Ambulatory Visit: Payer: Self-pay | Admitting: Family Medicine

## 2016-06-29 DIAGNOSIS — E871 Hypo-osmolality and hyponatremia: Secondary | ICD-10-CM

## 2016-06-29 MED ORDER — POTASSIUM CHLORIDE CRYS ER 20 MEQ PO TBCR: EXTENDED_RELEASE_TABLET | ORAL | 3 refills | Status: DC

## 2016-06-29 NOTE — Telephone Encounter (Signed)
[facility] faxed a request on the following medication. Thanks CC  potassium chloride SA (K-DUR,KLOR-CON) 20 MEQ tablet  Take 1 tablet by mouth daily

## 2016-07-04 ENCOUNTER — Ambulatory Visit: Payer: Medicare Other | Admitting: Family Medicine

## 2016-07-17 ENCOUNTER — Ambulatory Visit (INDEPENDENT_AMBULATORY_CARE_PROVIDER_SITE_OTHER): Payer: Medicare Other | Admitting: Family Medicine

## 2016-07-17 ENCOUNTER — Encounter: Payer: Self-pay | Admitting: Family Medicine

## 2016-07-17 VITALS — BP 100/60 | HR 75 | Temp 98.5°F | Resp 16 | Ht 70.0 in | Wt 209.0 lb

## 2016-07-17 DIAGNOSIS — E1121 Type 2 diabetes mellitus with diabetic nephropathy: Secondary | ICD-10-CM | POA: Diagnosis not present

## 2016-07-17 DIAGNOSIS — Z79899 Other long term (current) drug therapy: Secondary | ICD-10-CM | POA: Diagnosis not present

## 2016-07-17 DIAGNOSIS — I10 Essential (primary) hypertension: Secondary | ICD-10-CM | POA: Diagnosis not present

## 2016-07-17 LAB — POCT GLYCOSYLATED HEMOGLOBIN (HGB A1C)
ESTIMATED AVERAGE GLUCOSE: 157
HEMOGLOBIN A1C: 7.1

## 2016-07-17 MED ORDER — AMLODIPINE BESYLATE 5 MG PO TABS: 2.5000 mg | ORAL_TABLET | Freq: Every day | ORAL | 0 refills | Status: DC

## 2016-07-17 NOTE — Progress Notes (Signed)
Patient: Jeremiah Gibson Male    DOB: December 21, 1935   81 y.o.   MRN: [id number] Visit Date: 07/17/2016  Today's Provider: Mila Merry, MD   Chief Complaint  Patient presents with  . Follow-up  . Hypertension  . Diabetes   Subjective:    HPI   Diabetes Mellitus Type II, Follow-up:   Lab Results  Component Value Date   HGBA1C 7.0 (H) 04/05/2016   HGBA1C 8.6 (H) 12/28/2015   HGBA1C 7.9 08/10/2015   Last seen for diabetes 3 months ago.  Management since then includes; labs checked, no changes. He reports good compliance with treatment. He is not having side effects. none Current symptoms include none and have been unchanged. Home blood sugar records: fasting range: 140-180  Episodes of hypoglycemia? no   Current Insulin Regimen: n/a Most Recent Eye Exam: 04/21/2016 Weight trend: stable Prior visit with dietician: no Current diet: well balanced Current exercise: [employer]  ----------------------------------------------------------------   Hypertension, follow-up:  BP Readings from Last 3 Encounters:  07/17/16 100/60  04/05/16 122/68  03/21/16 130/64    He was last seen for hypertension 3 months ago.  BP at that visit was 122/68. Management since that visit includes; labs checked, no changes .He reports good compliance with treatment. He is not having side effects. none He is exercising. He is adherent to low salt diet.   Outside blood pressures are not checking. He is experiencing none.  Patient denies none.   Cardiovascular risk factors include diabetes mellitus.  Use of agents associated with hypertension: none.   ----------------------------------------------------------------    No Known Allergies   Current Outpatient Prescriptions:  .  albuterol (VENTOLIN HFA) 108 (90 Base) MCG/ACT inhaler, Inhale 2 puffs into the lungs every 4 (four) hours as needed for wheezing or shortness of breath., Disp: 3 Inhaler, Rfl: 1 .  allopurinol  (ZYLOPRIM) 100 MG tablet, Take 2 tablets (200 mg total) by mouth 2 (two) times daily., Disp: 360 tablet, Rfl: 4 .  amLODipine (NORVASC) 5 MG tablet, Take 1 tablet by mouth  every day for blood  pressure, Disp: 90 tablet, Rfl: 4 .  aspirin 81 MG tablet, Take 81 mg by mouth every evening. , Disp: , Rfl:  .  finasteride (PROSCAR) 5 MG tablet, Take 1 tablet by mouth  every day, Disp: 90 tablet, Rfl: 4 .  fish oil-omega-3 fatty acids 1000 MG capsule, Take 1 g by mouth 2 (two) times daily. , Disp: , Rfl:  .  Fluticasone-Salmeterol (ADVAIR DISKUS) 250-50 MCG/DOSE AEPB, Inhale 1 puff into the lungs 2 (two) times daily., Disp: 3 each, Rfl: 1 .  glipiZIDE (GLUCOTROL XL) 10 MG 24 hr tablet, Take 1 tablet by mouth  daily with food for  diabetes; take with largest meal of the day, Disp: 90 tablet, Rfl: 4 .  losartan (COZAAR) 100 MG tablet, Take 1 tablet by mouth  every day for blood  pressure, Disp: 90 tablet, Rfl: 4 .  Magnesium 400 MG TABS, Take 400 mg by mouth 2 (two) times daily. , Disp: , Rfl:  .  metFORMIN (GLUCOPHAGE) 500 MG tablet, Take 2 tablets (1,000 mg total) by mouth 2 (two) times daily with a meal., Disp: 360 tablet, Rfl: 4 .  MULTIPLE VITAMIN PO, MULTIVITAMINS (Oral Tablet)  1 Every Day for 0 days  Quantity: 0.00;  Refills: 0   Ordered :06-Jan-2010  Jeremiah Gibson ;  Started 22-Mar-2009 Active, Disp: , Rfl:  .  Nintedanib (OFEV) 100 MG CAPS,  Take 100 mg by mouth 2 (two) times daily with a meal. , Disp: , Rfl:  .  pantoprazole (PROTONIX) 20 MG tablet, Take 20 mg by mouth 2 (two) times daily., Disp: , Rfl:  .  potassium chloride SA (K-DUR,KLOR-CON) 20 MEQ tablet, Take 1 tablet by mouth  daily, Disp: 90 tablet, Rfl: 3 .  simvastatin (ZOCOR) 20 MG tablet, TAKE 1 TABLET(20 MG) BY MOUTH DAILY, Disp: 90 tablet, Rfl: 4 .  terazosin (HYTRIN) 5 MG capsule, Take 1 capsule by mouth  every day for prostate, Disp: 90 capsule, Rfl: 4 .  triamterene-hydrochlorothiazide (MAXZIDE-25) 37.5-25 MG tablet, Take 1  tablet by mouth  Every Day for blood  pressure, Disp: 90 tablet, Rfl: 4  Review of Systems  Constitutional: Negative for appetite change, chills and fever.  Respiratory: Negative for chest tightness, shortness of breath and wheezing.   Cardiovascular: Negative for palpitations.  Gastrointestinal: Negative for abdominal pain, nausea and vomiting.    Social History  Substance Use Topics  . Smoking status: Former Smoker    Packs/day: 2.00    Years: 20.00    Types: Cigarettes, Pipe    Quit date: 11/22/1999  . Smokeless tobacco: Never Used     Comment: quit cigs in 72 and pipe in 01  . Alcohol use Yes     Comment: socially   Objective:   BP 100/60 (BP Location: Right Arm, Patient Position: Sitting, Cuff Size: Large)   Pulse 75   Temp 98.5 F (36.9 C) (Oral)   Resp 16   Ht 5\' 10"  (1.778 m)   Wt 209 lb (94.8 kg)   SpO2 95%   BMI 29.99 kg/m  Vitals:   07/17/16 1457  BP: 100/60  Pulse: 75  Resp: 16  Temp: 98.5 F (36.9 C)  TempSrc: Oral  SpO2: 95%  Weight: 209 lb (94.8 kg)  Height: 5\' 10"  (1.778 m)     Physical Exam   General Appearance:    Alert, cooperative, no distress  Eyes:    PERRL, conjunctiva/corneas clear, EOM's intact       Lungs:     Clear to auscultation bilaterally, respirations unlabored  Heart:    Regular rate and rhythm  Neurologic:   Awake, alert, oriented x 3. No apparent focal neurological           defect.       Results for orders placed or performed in visit on 07/17/16  POCT glycosylated hemoglobin (Hb A1C)  Result Value Ref Range   Hemoglobin A1C 7.1    Est. average glucose Bld gHb Est-mCnc 157        Assessment & Plan:     1. Diabetes mellitus with nephropathy (HCC) Stable. Continue current medications.   - POCT glycosylated hemoglobin (Hb A1C)  2. Essential hypertension Well controlled.  Continue current medications.   - amLODipine (NORVASC) 5 MG tablet; Take 0.5 tablets (2.5 mg total) by mouth daily. Take 1 tablet by mouth  every  day for blood  pressure  Dispense: 1 tablet; Refill: 0  3. Long-term use of high-risk medication  - Hepatic function panel  The entirety of the information documented in the History of Present Illness, Review of Systems and Physical Exam were personally obtained by me. Portions of this information were initially documented by April M. Hyacinth MeekerMiller, CMA and reviewed by me for thoroughness and accuracy.        Mila Merryonald Fisher, MD  [facility]

## 2016-07-18 LAB — HEPATIC FUNCTION PANEL
ALBUMIN: 4.2 g/dL (ref 3.5–4.8)
ALK PHOS: 88 IU/L (ref 39–117)
ALT: 21 IU/L (ref 0–44)
AST: 22 IU/L (ref 0–40)
BILIRUBIN, DIRECT: 0.12 mg/dL (ref 0.00–0.40)
Bilirubin Total: 0.4 mg/dL (ref 0.0–1.2)
Total Protein: 6.9 g/dL (ref 6.0–8.5)

## 2016-08-24 ENCOUNTER — Other Ambulatory Visit: Payer: Self-pay | Admitting: Family Medicine

## 2016-08-24 DIAGNOSIS — I10 Essential (primary) hypertension: Secondary | ICD-10-CM

## 2016-08-24 MED ORDER — TRIAMTERENE-HCTZ 37.5-25 MG PO TABS: ORAL_TABLET | ORAL | 4 refills | Status: DC

## 2016-08-24 NOTE — Telephone Encounter (Signed)
[facility] faxed a refill request on the following medications:  triamterene-hydrochlorothiazide (MAXZIDE-25) 37.5-25 MG tablet.  Take 1 tablet by mouth daily for blood pressure.  90 day supply.  [facility]

## 2016-08-29 ENCOUNTER — Telehealth: Payer: Self-pay | Admitting: Pulmonary Disease

## 2016-08-29 ENCOUNTER — Ambulatory Visit: Payer: Self-pay | Admitting: Pulmonary Disease

## 2016-08-29 NOTE — Telephone Encounter (Signed)
Received a fax from [facility] stating that the PA on file for patient's Ofev is set to expire 09/12/16. A new PA has been started on CMM. Key is [id number]. Per CMM, [employer] could take up to 3 days with the determination. Will route to my inbox for follow up.

## 2016-08-30 NOTE — Telephone Encounter (Signed)
Spoke with Jeremiah Gibson and she verified the information that was provided to [place] was correct. The PA was approved for the Ofev  Please see dates in previous message. She states they made pt aware and that we should received a letter. She states nothing further is needed.   Routing to [place] just as an Financial plannerYI. Will close message

## 2016-08-30 NOTE — Telephone Encounter (Signed)
Jeremiah Gibson, [employer] calling back with questions on PA of Ofev.  Recd renewal but need to ask questions.  CB is [phone], option 5.

## 2016-08-30 NOTE — Telephone Encounter (Signed)
Synetta Failnita [facility]([employer] [phone] opt 5) states the PA for Ofev have been approved for 08/29/16 to 08/29/2017..the patient have been notified, and the pt and RA will get a letter...ert

## 2016-09-12 ENCOUNTER — Ambulatory Visit (INDEPENDENT_AMBULATORY_CARE_PROVIDER_SITE_OTHER)
Admission: RE | Admit: 2016-09-12 | Discharge: 2016-09-12 | Disposition: A | Discharge status: Home / Self Care | Payer: Medicare Other | Source: Ambulatory Visit | Attending: Pulmonary Disease | Admitting: Pulmonary Disease

## 2016-09-12 ENCOUNTER — Ambulatory Visit (INDEPENDENT_AMBULATORY_CARE_PROVIDER_SITE_OTHER): Payer: Medicare Other | Admitting: Pulmonary Disease

## 2016-09-12 ENCOUNTER — Encounter: Payer: Self-pay | Admitting: Pulmonary Disease

## 2016-09-12 DIAGNOSIS — J849 Interstitial pulmonary disease, unspecified: Secondary | ICD-10-CM | POA: Diagnosis not present

## 2016-09-12 DIAGNOSIS — J8410 Pulmonary fibrosis, unspecified: Secondary | ICD-10-CM | POA: Diagnosis not present

## 2016-09-12 MED ORDER — FLUTICASONE FUROATE-VILANTEROL 100-25 MCG/INH IN AEPB: 1.0000 | INHALATION_SPRAY | Freq: Every day | RESPIRATORY_TRACT | 0 refills | Status: DC

## 2016-09-12 NOTE — Assessment & Plan Note (Signed)
This is likely related to IPF although it has responded to treatment for GERD in the past Continue Protonix

## 2016-09-12 NOTE — Assessment & Plan Note (Signed)
We will reassess with annual chest x-ray and PFTs but does not seem to have clinical worsening. Continue and check liver function tests every 3 months ofev

## 2016-09-12 NOTE — Progress Notes (Signed)
   Subjective:    Patient ID: Jeremiah Gibson, male    DOB: 1936/02/22, 81 y.o.   MRN: [id number]  HPI   81 y.o remote ex smoker for FU of cough variant asthma &ILD  [place] by [place] in [place] in 2008 for cough &wheezing &diagnosed with asthma. Review of his spirometry records essentially show mild restriction with FEV1 in the 72% range and normal FEV1 ratio. However spirometry from 08/2006 does show significant bronchodilator response which could be consistent with some reversible disease. On this basis, he was maintained on a regimen of Advair 250/50 twice a day and albuterol as needed for rescue . Allergy testing was negative.    His breathing has been stable. He has aches and pains diffusely and wonders if this is related to ofev or to statin. He continues to have dry cough. His LFTs were reviewed from 07/2013 which are normal     Significant tests/ events  2013 HRCT >>Mild subpleural reticulation/fibrosis, nonspecific. No definite honeycombing . Mild bilateral lower lobe bronchiectasis   HRCT 07/2015 >> UIP pattern, worse  RAST -neg  Short course of prednisone - helped  Trial of dexilant - seemed to be better than omeprazole , but he prefers generics.  PFTs 09/2011 - no airway obstruction, nml lung volumes, DLCO 64%   PFTs 11/2012 -slight drop in FVC from 3.8 to 3.5, DLCO increased to 17.9 (57%), TLC dropped to 4.95(76%)  PFT 07/2015 FVC 86%, TLC 67%, DLCO 57% (stable)    Review of Systems neg for any significant sore throat, dysphagia, itching, sneezing, nasal congestion or excess/ purulent secretions, fever, chills, sweats, unintended wt loss, pleuritic or exertional cp, hempoptysis, orthopnea pnd or change in chronic leg swelling. Also denies presyncope, palpitations, heartburn, abdominal pain, nausea, vomiting, diarrhea or change in bowel or urinary habits, dysuria,hematuria, rash, arthralgias, visual complaints, headache, numbness weakness or ataxia.       Objective:   Physical Exam   Gen. Pleasant, well-nourished, in no distress ENT - no thrush, no post nasal drip Neck: No JVD, no thyromegaly, no carotid bruits Lungs: no use of accessory muscles, no dullness to percussion, bibasal rales,no rhonchi  Cardiovascular: Rhythm regular, heart sounds  normal, no murmurs or gallops, no peripheral edema Musculoskeletal: No deformities, no cyanosis or clubbing         Assessment & Plan:

## 2016-09-12 NOTE — Patient Instructions (Signed)
Chest x-ray today. Schedule PFTs -we will call you with results  Continue checking  liver tests every 3 months

## 2016-09-14 ENCOUNTER — Ambulatory Visit (INDEPENDENT_AMBULATORY_CARE_PROVIDER_SITE_OTHER): Payer: Medicare Other | Admitting: Pulmonary Disease

## 2016-09-14 DIAGNOSIS — J849 Interstitial pulmonary disease, unspecified: Secondary | ICD-10-CM

## 2016-09-14 LAB — PULMONARY FUNCTION TEST
DL/VA % pred: 82 %
DL/VA: 3.77 ml/min/mmHg/L
DLCO UNC % PRED: 59 %
DLCO UNC: 19.1 ml/min/mmHg
DLCO cor % pred: 60 %
DLCO cor: 19.56 ml/min/mmHg
FEF 25-75 POST: 4.44 L/s
FEF 25-75 Pre: 4.03 L/sec
FEF2575-%Change-Post: 10 %
FEF2575-%PRED-POST: 218 %
FEF2575-%Pred-Pre: 198 %
FEV1-%Change-Post: 2 %
FEV1-%PRED-PRE: 102 %
FEV1-%Pred-Post: 105 %
FEV1-PRE: 2.99 L
FEV1-Post: 3.07 L
FEV1FVC-%Change-Post: 0 %
FEV1FVC-%PRED-PRE: 118 %
FEV6-%Change-Post: 1 %
FEV6-%PRED-POST: 93 %
FEV6-%Pred-Pre: 91 %
FEV6-Post: 3.55 L
FEV6-Pre: 3.5 L
FEV6FVC-%CHANGE-POST: 0 %
FEV6FVC-%PRED-PRE: 107 %
FEV6FVC-%Pred-Post: 107 %
FVC-%Change-Post: 1 %
FVC-%Pred-Post: 87 %
FVC-%Pred-Pre: 85 %
FVC-Post: 3.56 L
FVC-Pre: 3.5 L
POST FEV1/FVC RATIO: 86 %
Post FEV6/FVC ratio: 100 %
Pre FEV1/FVC ratio: 85 %
Pre FEV6/FVC Ratio: 100 %
RV % PRED: 105 %
RV: 2.79 L
TLC % pred: 88 %
TLC: 6.23 L

## 2016-09-14 NOTE — Progress Notes (Signed)
PFT done today. 

## 2016-09-25 DIAGNOSIS — K219 Gastro-esophageal reflux disease without esophagitis: Secondary | ICD-10-CM | POA: Diagnosis not present

## 2016-09-25 DIAGNOSIS — Z86010 Personal history of colonic polyps: Secondary | ICD-10-CM | POA: Diagnosis not present

## 2016-09-26 ENCOUNTER — Telehealth: Payer: Self-pay | Admitting: Pulmonary Disease

## 2016-09-26 NOTE — Telephone Encounter (Signed)
Received a fax from Dr. Reyes IvanSkulskie's office with [facility]) asking for a pulmonary clearance for his colonoscopy. Procedure has been scheduled for 03/16/2017.   He was last seen by you on 09/12/16 for a ILD follow up. Per AVS---> "We will reassess with annual chest x-ray and PFTs but does not seem to have clinical worsening. Continue and check liver function tests every 3 months ofev" He had a PFT on 7/5 and CXR on 7/3.   RA, please advise if he is cleared from a pulmonary standpoint. I have the fax in my look-at folder. Thanks!

## 2016-09-26 NOTE — Telephone Encounter (Signed)
Okay to proceed with colonoscopy.

## 2016-09-26 NOTE — Telephone Encounter (Signed)
Spoke with patient-aware that RA has not read PFT yet and will send to him as friendly reminder that patient would like call back as soon as possible regarding results.

## 2016-09-26 NOTE — Telephone Encounter (Signed)
Lung function is stable compared to one year ago Okay to proceed with colonoscopy

## 2016-09-26 NOTE — Telephone Encounter (Signed)
lmtcb for pt on both home and cell.

## 2016-09-26 NOTE — Telephone Encounter (Signed)
Spoke with patient's wife. She is aware of his results and will tell him once he returns home. Also advised her that he had been cleared by Dr. Vassie LollAlva for his colonoscopy. Will fax the clearance letter to his GI. Nothing else was needed at the time of call.

## 2016-10-16 DIAGNOSIS — E119 Type 2 diabetes mellitus without complications: Secondary | ICD-10-CM | POA: Diagnosis not present

## 2016-10-16 LAB — HM DIABETES EYE EXAM

## 2016-10-17 ENCOUNTER — Encounter: Payer: Self-pay | Admitting: *Deleted

## 2016-10-24 ENCOUNTER — Encounter: Payer: Self-pay | Admitting: Family Medicine

## 2016-10-25 ENCOUNTER — Telehealth: Payer: Self-pay | Admitting: Pulmonary Disease

## 2016-10-25 NOTE — Telephone Encounter (Signed)
I have the forms for the RX refill. I will have RA sign this tomorrow.

## 2016-10-27 ENCOUNTER — Telehealth: Payer: Self-pay | Admitting: Pulmonary Disease

## 2016-10-27 NOTE — Telephone Encounter (Signed)
I have spoken with [name] with [facility]. [name] is requesting verbal for Ofev 100 bid, as Rx on file is over an year old. Per 10/25/16 phone note [facility] had faxed over Rx for RA to sign. I have spoken with [name], who states Rx has been signed by RA and will be faxed back to [facility]. I was unable to give verbal to [name], due to RA's last note not stating that pt was on Ofev.

## 2016-10-31 NOTE — Telephone Encounter (Signed)
RA - please advise. Thanks! 

## 2016-11-02 ENCOUNTER — Other Ambulatory Visit: Payer: Self-pay | Admitting: Family Medicine

## 2016-11-02 MED ORDER — FLUTICASONE FUROATE-VILANTEROL 100-25 MCG/INH IN AEPB: 1.0000 | INHALATION_SPRAY | Freq: Every day | RESPIRATORY_TRACT | 0 refills | Status: DC

## 2016-11-02 NOTE — Telephone Encounter (Signed)
[facility] faxed a refill request on the following medications:  Fluticasone-Salmeterol (ADVAIR DISKUS) 250-50 MCG/DOSE AEPB.  Inhale 1 puff into the lungs twice daily.    [facility]

## 2016-11-03 NOTE — Telephone Encounter (Signed)
I have spoken with [facility] with [facility]. Holly confirmed that Rx for Ofev 100mg  bid was received on 10/27/16. Nothing further needed.  Will route to RA as a fyi

## 2016-11-03 NOTE — Telephone Encounter (Signed)
Okay for verbal. Please confirm dose -100 or 150 twice a day

## 2016-11-07 ENCOUNTER — Other Ambulatory Visit: Payer: Self-pay

## 2016-11-07 NOTE — Telephone Encounter (Signed)
Last refill was in April.-aa

## 2016-11-07 NOTE — Telephone Encounter (Signed)
Brianna from [facility] is calling that they faxed a prescription request for Advair on 08/23? Do you anything bout this.   Phone number [phone]  Thanks,  -Torri Michalski

## 2016-11-08 MED ORDER — FLUTICASONE-SALMETEROL 250-50 MCG/DOSE IN AEPB: 1.0000 | INHALATION_SPRAY | Freq: Two times a day (BID) | RESPIRATORY_TRACT | 5 refills | Status: DC

## 2016-11-21 ENCOUNTER — Encounter: Payer: Self-pay | Admitting: Family Medicine

## 2016-11-21 ENCOUNTER — Ambulatory Visit (INDEPENDENT_AMBULATORY_CARE_PROVIDER_SITE_OTHER): Payer: Medicare Other | Admitting: Family Medicine

## 2016-11-21 ENCOUNTER — Other Ambulatory Visit: Payer: Self-pay | Admitting: Family Medicine

## 2016-11-21 VITALS — BP 134/74 | HR 80 | Temp 97.7°F | Resp 18 | Wt 208.0 lb

## 2016-11-21 DIAGNOSIS — J849 Interstitial pulmonary disease, unspecified: Secondary | ICD-10-CM | POA: Diagnosis not present

## 2016-11-21 DIAGNOSIS — I2510 Atherosclerotic heart disease of native coronary artery without angina pectoris: Secondary | ICD-10-CM | POA: Diagnosis not present

## 2016-11-21 DIAGNOSIS — E1121 Type 2 diabetes mellitus with diabetic nephropathy: Secondary | ICD-10-CM | POA: Diagnosis not present

## 2016-11-21 DIAGNOSIS — Z23 Encounter for immunization: Secondary | ICD-10-CM

## 2016-11-21 DIAGNOSIS — I10 Essential (primary) hypertension: Secondary | ICD-10-CM | POA: Diagnosis not present

## 2016-11-21 LAB — POCT GLYCOSYLATED HEMOGLOBIN (HGB A1C)
Est. average glucose Bld gHb Est-mCnc: 163
HEMOGLOBIN A1C: 7.3

## 2016-11-21 LAB — POCT UA - MICROALBUMIN: Microalbumin Ur, POC: 20 mg/L

## 2016-11-21 MED ORDER — AMLODIPINE BESYLATE 2.5 MG PO TABS: 2.5000 mg | ORAL_TABLET | Freq: Every day | ORAL | 4 refills | Status: DC

## 2016-11-21 MED ORDER — PRAVASTATIN SODIUM 20 MG PO TABS
20.0000 mg | ORAL_TABLET | Freq: Every day | ORAL | 4 refills | Status: DC
Start: 2016-11-21 — End: 2016-12-28

## 2016-11-21 NOTE — Progress Notes (Signed)
Patient: Jeremiah Gibson Male    DOB: 17-Jun-1935   81 y.o.   MRN: [id number] Visit Date: 11/21/2016  Today's Provider: Mila Merry, MD   Chief Complaint  Patient presents with  . Hypertension  . Diabetes   Subjective:    HPI  Hypertension, follow-up:  BP Readings from Last 3 Encounters:  09/12/16 132/80  07/17/16 100/60  04/05/16 122/68    He was last seen for hypertension 4 months ago.  BP at that visit was 100/60. Management since that visit includes no changes. He reports good compliance with treatment. He is not having side effects.  He is exercising. He is adherent to low salt diet.   Outside blood pressures are not being checked. He is experiencing none.  Patient denies chest pain, chest pressure/discomfort, claudication, dyspnea, exertional chest pressure/discomfort, fatigue, irregular heart beat, lower extremity edema, near-syncope, orthopnea, palpitations, paroxysmal nocturnal dyspnea, syncope and tachypnea.   Cardiovascular risk factors include advanced age (older than 74 for men, 16 for women), diabetes mellitus, hypertension and male gender.  Use of agents associated with hypertension: NSAIDS.     Weight trend: stable Wt Readings from Last 3 Encounters:  09/12/16 134 lb 3.2 oz (60.9 kg)  07/17/16 209 lb (94.8 kg)  04/05/16 211 lb (95.7 kg)    Current diet: in general, an "unhealthy" diet  ------------------------------------------------------------------------  Diabetes Mellitus Type II, Follow-up:   Lab Results  Component Value Date   HGBA1C 7.1 07/17/2016   HGBA1C 7.0 (H) 04/05/2016   HGBA1C 8.6 (H) 12/28/2015    Last seen for diabetes 4 months ago.  Management since then includes no changes. He reports good compliance with treatment. He is not having side effects.  Current symptoms include none and have been stable. Home blood sugar records: fasting range: 130-140  Episodes of hypoglycemia? no   Current Insulin Regimen:  none Most Recent Eye Exam: < 1 year ago Weight trend: stable Prior visit with dietician: no Current diet: in general, an "unhealthy" diet Current exercise: group exercises at [employer]  Pertinent Labs:    Component Value Date/Time   CHOL 146 04/05/2016 0932   TRIG 160 (H) 04/05/2016 0932   HDL 43 04/05/2016 0932   LDLCALC 71 04/05/2016 0932   CREATININE 1.25 04/05/2016 0932    Wt Readings from Last 3 Encounters:  09/12/16 134 lb 3.2 oz (60.9 kg)  07/17/16 209 lb (94.8 kg)  04/05/16 211 lb (95.7 kg)    ------------------------------------------------------------------------ Follow up of Interstitual Lung Disease: Patient was last seen for this problem 8 months ago. Management during that visit includes ordering labs for surveillance while on Ofev. No changes were made. Patient was advised to continue regular follow up with Pulmonology.    Lipid/Cholesterol, Follow-up:   Last seen for this 5 months ago.  Management changes since that visit include none. . Last Lipid Panel:    Component Value Date/Time   CHOL 146 04/05/2016 0932   TRIG 160 (H) 04/05/2016 0932   HDL 43 04/05/2016 0932   CHOLHDL 3.4 04/05/2016 0932   LDLCALC 71 04/05/2016 0932    Risk factors for vascular disease include diabetes mellitus, hypercholesterolemia and hypertension  He reports good compliance with treatment. He is not having side effects.  Current symptoms include none and have been stable. Weight trend: stable Prior visit with dietician: no Current diet: in general, an "unhealthy" diet Current exercise: group exercises  Wt Readings from Last 3 Encounters:  11/21/16 208 lb (94.3 kg)  09/12/16 134 lb 3.2 oz (60.9 kg)  07/17/16 209 lb (94.8 kg)    -------------------------------------------------------------------     No Known Allergies   Current Outpatient Prescriptions:  .  albuterol (VENTOLIN HFA) 108 (90 Base) MCG/ACT inhaler, Inhale 2 puffs into the lungs every 4  (four) hours as needed for wheezing or shortness of breath., Disp: 3 Inhaler, Rfl: 1 .  allopurinol (ZYLOPRIM) 100 MG tablet, Take 2 tablets (200 mg total) by mouth 2 (two) times daily., Disp: 360 tablet, Rfl: 4 .  amLODipine (NORVASC) 5 MG tablet, Take 0.5 tablets (2.5 mg total) by mouth daily. Take 1 tablet by mouth  every day for blood  pressure, Disp: 1 tablet, Rfl: 0 .  aspirin 81 MG tablet, Take 81 mg by mouth every evening. , Disp: , Rfl:  .  finasteride (PROSCAR) 5 MG tablet, Take 1 tablet by mouth  every day, Disp: 90 tablet, Rfl: 4 .  fish oil-omega-3 fatty acids 1000 MG capsule, Take 1 g by mouth 2 (two) times daily. , Disp: , Rfl:  .  fluticasone furoate-vilanterol (BREO ELLIPTA) 100-25 MCG/INH AEPB, Inhale 1 puff into the lungs daily., Disp: 1 each, Rfl: 0 .  Fluticasone-Salmeterol (ADVAIR DISKUS) 250-50 MCG/DOSE AEPB, Inhale 1 puff into the lungs 2 (two) times daily., Disp: 3 each, Rfl: 5 .  glipiZIDE (GLUCOTROL XL) 10 MG 24 hr tablet, Take 1 tablet by mouth  daily with food for  diabetes; take with largest meal of the day, Disp: 90 tablet, Rfl: 4 .  losartan (COZAAR) 100 MG tablet, Take 1 tablet by mouth  every day for blood  pressure, Disp: 90 tablet, Rfl: 4 .  Magnesium 400 MG TABS, Take 400 mg by mouth 2 (two) times daily. , Disp: , Rfl:  .  metFORMIN (GLUCOPHAGE) 500 MG tablet, Take 2 tablets (1,000 mg total) by mouth 2 (two) times daily with a meal., Disp: 360 tablet, Rfl: 4 .  MULTIPLE VITAMIN PO, MULTIVITAMINS (Oral Tablet)  1 Every Day for 0 days  Quantity: 0.00;  Refills: 0   Ordered :06-Jan-2010  [name] ;  Started 22-Mar-2009 Active, Disp: , Rfl:  .  Nintedanib (OFEV) 100 MG CAPS, Take 100 mg by mouth 2 (two) times daily with a meal. , Disp: , Rfl:  .  pantoprazole (PROTONIX) 20 MG tablet, Take 20 mg by mouth 2 (two) times daily., Disp: , Rfl:  .  potassium chloride SA (K-DUR,KLOR-CON) 20 MEQ tablet, Take 1 tablet by mouth  daily, Disp: 90 tablet, Rfl: 3 .   simvastatin (ZOCOR) 20 MG tablet, TAKE 1 TABLET(20 MG) BY MOUTH DAILY, Disp: 90 tablet, Rfl: 4 .  terazosin (HYTRIN) 5 MG capsule, Take 1 capsule by mouth  every day for prostate, Disp: 90 capsule, Rfl: 4 .  triamterene-hydrochlorothiazide (MAXZIDE-25) 37.5-25 MG tablet, Take 1 tablet by mouth  Every Day for blood  pressure, Disp: 90 tablet, Rfl: 4  Review of Systems  Constitutional: Negative for appetite change, chills and fever.  Respiratory: Negative for chest tightness, shortness of breath and wheezing.   Cardiovascular: Negative for chest pain and palpitations.  Gastrointestinal: Negative for abdominal pain, nausea and vomiting.  Musculoskeletal: Positive for myalgias (muscle cramps).    Social History  Substance Use Topics  . Smoking status: Former Smoker    Packs/day: 2.00    Years: 20.00    Types: Cigarettes, Pipe    Quit date: 11/22/1999  . Smokeless tobacco: Never Used     Comment: quit cigs in 72  and pipe in 01  . Alcohol use Yes     Comment: socially   Objective:   BP 134/74 (BP Location: Right Arm, Patient Position: Sitting, Cuff Size: Large)   Pulse 80   Temp 97.7 F (36.5 C) (Oral)   Resp 18   Wt 208 lb (94.3 kg)   SpO2 96% Comment: room air  BMI 29.84 kg/m  There were no vitals filed for this visit.   Physical Exam  General Appearance:    Alert, cooperative, no distress, obese  Eyes:    PERRL, conjunctiva/corneas clear, EOM's intact       Lungs:     Clear to auscultation bilaterally, respirations unlabored  Heart:    Regular rate and rhythm  Neurologic:   Awake, alert, oriented x 3. No apparent focal neurological           defect.        Results for orders placed or performed in visit on 11/21/16  POCT HgB A1C  Result Value Ref Range   Hemoglobin A1C 7.3    Est. average glucose Bld gHb Est-mCnc 163        Assessment & Plan:     1. Diabetes mellitus with nephropathy (HCC) Fairly well controlled. Continue current medications.   - POCT HgB A1C -  POCT UA - Microalbumin  2. Essential hypertension Doing well current dose of amlodipine. Continue current medications.   - amLODipine (NORVASC) 2.5 MG tablet; Take 1 tablet (2.5 mg total) by mouth daily.  Dispense: 90 tablet; Refill: 4  3. Need for influenza vaccination  - Flu vaccine HIGH DOSE PF (Fluzone High dose)  4. Coronary artery disease involving native heart without angina pectoris, unspecified vessel or lesion type Asymptomatic. Compliant with medication.  Continue aggressive risk factor modification.    5. ILD (interstitial lung disease) (HCC) Stable, continue regular follow up Dr. Vassie LollAlva  Return in about 4 months (around 03/23/2017).        Mila Merryonald Fisher, MD  [facility]

## 2016-11-23 ENCOUNTER — Other Ambulatory Visit: Payer: Self-pay | Admitting: Family Medicine

## 2016-11-23 NOTE — Telephone Encounter (Signed)
[facility] pharmacy  faxed a request for the following. Thanks CC  ONE TOUCH ULTRA TEST test strip  >Use 1 Strip to check glucose once daily.

## 2016-11-23 NOTE — Telephone Encounter (Signed)
Rx already sent to pharmacy.

## 2016-12-07 ENCOUNTER — Other Ambulatory Visit: Payer: Self-pay | Admitting: Family Medicine

## 2016-12-07 DIAGNOSIS — E1121 Type 2 diabetes mellitus with diabetic nephropathy: Secondary | ICD-10-CM | POA: Diagnosis not present

## 2016-12-07 MED ORDER — GLUCOSE BLOOD VI STRP: ORAL_STRIP | 4 refills | Status: AC

## 2016-12-18 ENCOUNTER — Ambulatory Visit (INDEPENDENT_AMBULATORY_CARE_PROVIDER_SITE_OTHER): Payer: Medicare Other | Admitting: Family Medicine

## 2016-12-18 ENCOUNTER — Encounter: Payer: Self-pay | Admitting: Family Medicine

## 2016-12-18 VITALS — BP 120/70 | HR 92 | Temp 98.0°F | Resp 16 | Wt 199.0 lb

## 2016-12-18 DIAGNOSIS — G459 Transient cerebral ischemic attack, unspecified: Secondary | ICD-10-CM | POA: Diagnosis not present

## 2016-12-18 DIAGNOSIS — R410 Disorientation, unspecified: Secondary | ICD-10-CM

## 2016-12-18 DIAGNOSIS — R413 Other amnesia: Secondary | ICD-10-CM | POA: Diagnosis not present

## 2016-12-18 DIAGNOSIS — I440 Atrioventricular block, first degree: Secondary | ICD-10-CM | POA: Insufficient documentation

## 2016-12-18 NOTE — Progress Notes (Signed)
Patient: Jeremiah Gibson Male    DOB: 1935/09/13   81 y.o.   MRN: [id number] Visit Date: 12/18/2016  Today's Provider: Mila Merry, MD   Chief Complaint  Patient presents with  . Memory Loss   Subjective:    HPI  Patient states that he has had some episodes lately concerning his memory. Forgetting people he knows and having some confusion.  States that two nights he spaced out for about 30 minutes. Felt really hot, became confused, couldn't figure out how to put on seatbelt. Didn't recognized an acquatance. Took two aspirin about 5 minutes before symptoms completely resolved. Has felt fine ever since.   No Known Allergies   Current Outpatient Prescriptions:  .  albuterol (VENTOLIN HFA) 108 (90 Base) MCG/ACT inhaler, Inhale 2 puffs into the lungs every 4 (four) hours as needed for wheezing or shortness of breath., Disp: 3 Inhaler, Rfl: 1 .  allopurinol (ZYLOPRIM) 100 MG tablet, Take 2 tablets (200 mg total) by mouth 2 (two) times daily., Disp: 360 tablet, Rfl: 4 .  amLODipine (NORVASC) 2.5 MG tablet, Take 1 tablet (2.5 mg total) by mouth daily., Disp: 90 tablet, Rfl: 4 .  aspirin 81 MG tablet, Take 81 mg by mouth every evening. , Disp: , Rfl:  .  finasteride (PROSCAR) 5 MG tablet, Take 1 tablet by mouth  every day, Disp: 90 tablet, Rfl: 4 .  fish oil-omega-3 fatty acids 1000 MG capsule, Take 1 g by mouth 2 (two) times daily. , Disp: , Rfl:  .  fluticasone furoate-vilanterol (BREO ELLIPTA) 100-25 MCG/INH AEPB, Inhale 1 puff into the lungs daily., Disp: 1 each, Rfl: 0 .  Fluticasone-Salmeterol (ADVAIR DISKUS) 250-50 MCG/DOSE AEPB, Inhale 1 puff into the lungs 2 (two) times daily., Disp: 3 each, Rfl: 5 .  glipiZIDE (GLUCOTROL XL) 10 MG 24 hr tablet, Take 1 tablet by mouth  daily with food for  diabetes; take with largest meal of the day, Disp: 90 tablet, Rfl: 4 .  glucose blood (ONE TOUCH ULTRA TEST) test strip, Use as instructed to check blood sugar daily. Dx: E11.21, Disp: 100  each, Rfl: 4 .  losartan (COZAAR) 100 MG tablet, Take 1 tablet by mouth  every day for blood  pressure, Disp: 90 tablet, Rfl: 4 .  Magnesium 400 MG TABS, Take 400 mg by mouth 2 (two) times daily. , Disp: , Rfl:  .  metFORMIN (GLUCOPHAGE) 500 MG tablet, Take 2 tablets (1,000 mg total) by mouth 2 (two) times daily with a meal., Disp: 360 tablet, Rfl: 4 .  MULTIPLE VITAMIN PO, MULTIVITAMINS (Oral Tablet)  1 Every Day for 0 days  Quantity: 0.00;  Refills: 0   Ordered :06-Jan-2010  [name] ;  Started 22-Mar-2009 Active, Disp: , Rfl:  .  Nintedanib (OFEV) 100 MG CAPS, Take 100 mg by mouth 2 (two) times daily with a meal. , Disp: , Rfl:  .  pantoprazole (PROTONIX) 20 MG tablet, Take 20 mg by mouth 2 (two) times daily., Disp: , Rfl:  .  potassium chloride SA (K-DUR,KLOR-CON) 20 MEQ tablet, Take 1 tablet by mouth  daily, Disp: 90 tablet, Rfl: 3 .  pravastatin (PRAVACHOL) 20 MG tablet, Take 1 tablet (20 mg total) by mouth daily., Disp: 90 tablet, Rfl: 4 .  terazosin (HYTRIN) 5 MG capsule, Take 1 capsule by mouth  every day for prostate, Disp: 90 capsule, Rfl: 4 .  triamterene-hydrochlorothiazide (MAXZIDE-25) 37.5-25 MG tablet, Take 1 tablet by mouth  Every Day  for blood  pressure, Disp: 90 tablet, Rfl: 4  Review of Systems  Constitutional: Negative for appetite change, chills and fever.  Respiratory: Negative for chest tightness, shortness of breath and wheezing.   Cardiovascular: Negative for chest pain and palpitations.  Gastrointestinal: Negative for abdominal pain, nausea and vomiting.    Social History  Substance Use Topics  . Smoking status: Former Smoker    Packs/day: 2.00    Years: 20.00    Types: Cigarettes, Pipe    Quit date: 11/22/1999  . Smokeless tobacco: Never Used     Comment: quit cigs in 70 and pipe in 01  . Alcohol use Yes     Comment: socially   Objective:   BP 120/70 (BP Location: Right Arm, Patient Position: Sitting, Cuff Size: Large)   Pulse 92   Temp 98 F (36.7  C) (Oral)   Resp 16   Wt 199 lb (90.3 kg)   SpO2 96%   BMI 28.55 kg/m  Vitals:   12/18/16 0957  BP: 120/70  Pulse: 92  Resp: 16  Temp: 98 F (36.7 C)  TempSrc: Oral  SpO2: 96%  Weight: 199 lb (90.3 kg)   Cognitive Testing - 6-CIT  Correct? Score   What year is it? yes 0 0 or 4  What month is it? yes 0 0 or 3  Memorize:    Floyde Parkins,  44,  [address]  [place],      What time is it? (within 1 hour) yes 0 0 or 3  Count backwards from 20 yes 0 0, 2, or 4  Name the months of the year yes 0 0, 2, or 4  Repeat name & address above yes 3 0, 2, 4, 6, 8, or 10       TOTAL SCORE  3/28   Interpretation:  Normal  Normal (0-7) Abnormal (8-28)     Physical Exam   General Appearance:    Alert, cooperative, no distress  Eyes:    PERRL, conjunctiva/corneas clear, EOM's intact       Lungs:     Clear to auscultation bilaterally, respirations unlabored  Heart:    Regular rate and rhythm  Neurologic:   Awake, alert, oriented x 3. No apparent focal neurological           defect. Normal somatosensation. +5 UE and LE muscle strength. DTRs 2+ and symmetric.           Assessment & Plan:     1. TIA (transient ischemic attack) Advised to increase ECASA to 2 x  daily - EKG 12-Lead - US Carotid Duplex Bilateral; Future - MR MRA HEAD WO CONTRAST; Future  2. Disorientation  - US Carotid Duplex Bilateral; Future - MR MRA HEAD WO CONTRAST; Future  3. Memory loss Now resolved. Highly suspicious for TIA.  - US Carotid Duplex Bilateral; Future - MR MRA HEAD WO CONTRAST; Future       Mila Merry, MD  [facility]

## 2016-12-21 ENCOUNTER — Ambulatory Visit
Admission: RE | Admit: 2016-12-21 | Discharge: 2016-12-21 | Disposition: A | Discharge status: Home / Self Care | Payer: Medicare Other | Source: Ambulatory Visit | Attending: Family Medicine | Admitting: Family Medicine

## 2016-12-21 DIAGNOSIS — R413 Other amnesia: Secondary | ICD-10-CM | POA: Insufficient documentation

## 2016-12-21 DIAGNOSIS — R410 Disorientation, unspecified: Secondary | ICD-10-CM | POA: Insufficient documentation

## 2016-12-21 DIAGNOSIS — G459 Transient cerebral ischemic attack, unspecified: Secondary | ICD-10-CM

## 2016-12-22 ENCOUNTER — Ambulatory Visit: Payer: Medicare Other

## 2016-12-26 ENCOUNTER — Ambulatory Visit
Admission: RE | Admit: 2016-12-26 | Discharge: 2016-12-26 | Disposition: A | Discharge status: Home / Self Care | Payer: Medicare Other | Source: Ambulatory Visit | Attending: Family Medicine | Admitting: Family Medicine

## 2016-12-26 DIAGNOSIS — R410 Disorientation, unspecified: Secondary | ICD-10-CM | POA: Diagnosis not present

## 2016-12-26 DIAGNOSIS — R413 Other amnesia: Secondary | ICD-10-CM | POA: Diagnosis not present

## 2016-12-26 DIAGNOSIS — I6523 Occlusion and stenosis of bilateral carotid arteries: Secondary | ICD-10-CM | POA: Insufficient documentation

## 2016-12-26 DIAGNOSIS — G459 Transient cerebral ischemic attack, unspecified: Secondary | ICD-10-CM

## 2016-12-28 ENCOUNTER — Other Ambulatory Visit: Payer: Self-pay

## 2016-12-28 MED ORDER — PRAVASTATIN SODIUM 40 MG PO TABS: 40.0000 mg | ORAL_TABLET | Freq: Every day | ORAL | 3 refills | Status: DC

## 2016-12-28 MED ORDER — CLOPIDOGREL BISULFATE 75 MG PO TABS: 75.0000 mg | ORAL_TABLET | Freq: Every day | ORAL | 3 refills | Status: DC

## 2016-12-28 NOTE — Progress Notes (Signed)
Patient advised, meds sent to pharmacy and OV made

## 2017-01-30 ENCOUNTER — Encounter: Payer: Self-pay | Admitting: Family Medicine

## 2017-01-30 ENCOUNTER — Ambulatory Visit: Payer: Medicare Other | Admitting: Family Medicine

## 2017-01-30 VITALS — BP 120/60 | HR 82 | Temp 97.9°F | Resp 16 | Ht 70.0 in | Wt 205.0 lb

## 2017-01-30 DIAGNOSIS — G459 Transient cerebral ischemic attack, unspecified: Secondary | ICD-10-CM

## 2017-01-30 DIAGNOSIS — Z6829 Body mass index (BMI) 29.0-29.9, adult: Secondary | ICD-10-CM | POA: Diagnosis not present

## 2017-01-30 DIAGNOSIS — E785 Hyperlipidemia, unspecified: Secondary | ICD-10-CM

## 2017-01-30 LAB — CBC
HEMATOCRIT: 34.2 % — AB (ref 38.5–50.0)
HEMOGLOBIN: 11.5 g/dL — AB (ref 13.2–17.1)
MCH: 29.9 pg (ref 27.0–33.0)
MCHC: 33.6 g/dL (ref 32.0–36.0)
MCV: 89.1 fL (ref 80.0–100.0)
MPV: 10.6 fL (ref 7.5–12.5)
Platelets: 209 10*3/uL (ref 140–400)
RBC: 3.84 10*6/uL — ABNORMAL LOW (ref 4.20–5.80)
RDW: 14.2 % (ref 11.0–15.0)
WBC: 7.1 10*3/uL (ref 3.8–10.8)

## 2017-01-30 LAB — LIPID PANEL
CHOL/HDL RATIO: 3.5 (calc) (ref <5.0)
Cholesterol: 156 mg/dL (ref <200)
HDL: 45 mg/dL (ref >40)
LDL Cholesterol (Calc): 90 mg/dL (calc)
NON-HDL CHOLESTEROL (CALC): 111 mg/dL (ref <130)
TRIGLYCERIDES: 110 mg/dL (ref <150)

## 2017-01-30 NOTE — Progress Notes (Signed)
Patient: Jeremiah Gibson Male    DOB: [birth year]   81 y.o.   MRN: [id number] Visit Date: 01/30/2017  Today's Provider: Mila Merryonald Michal Callicott, MD   Chief Complaint  Patient presents with  . Follow-up   Subjective:    HPI  TIA (transient ischemic attack) From 12/18/2016-Advised to increase ECASA to 2 x 81mg  daily Since then had US Carotid Duplex Bilateral ordered-showing small plaques in carotid arteries.  Increased pravastatin to 40 mg qd and added clopidogrel 75 mg qd, and reduced ECASA back down to one tablet daily. Patient advised to schedule follow-up ov in 1 month.   He states he has had no other episodes of confusion since then. He has had loose stools the last couple of days. Is tolerating pravastatin and clopidogrel without adverse effects.     No Known Allergies   Current Outpatient Medications:  .  albuterol (VENTOLIN HFA) 108 (90 Base) MCG/ACT inhaler, Inhale 2 puffs into the lungs every 4 (four) hours as needed for wheezing or shortness of breath., Disp: 3 Inhaler, Rfl: 1 .  allopurinol (ZYLOPRIM) 100 MG tablet, Take 2 tablets (200 mg total) by mouth 2 (two) times daily., Disp: 360 tablet, Rfl: 4 .  amLODipine (NORVASC) 2.5 MG tablet, Take 1 tablet (2.5 mg total) by mouth daily., Disp: 90 tablet, Rfl: 4 .  aspirin 81 MG tablet, Take 81 mg by mouth every evening. , Disp: , Rfl:  .  clopidogrel (PLAVIX) 75 MG tablet, Take 1 tablet (75 mg total) by mouth daily., Disp: 30 tablet, Rfl: 3 .  finasteride (PROSCAR) 5 MG tablet, Take 1 tablet by mouth  every day, Disp: 90 tablet, Rfl: 4 .  fish oil-omega-3 fatty acids 1000 MG capsule, Take 1 g by mouth 2 (two) times daily. , Disp: , Rfl:  .  fluticasone furoate-vilanterol (BREO ELLIPTA) 100-25 MCG/INH AEPB, Inhale 1 puff into the lungs daily., Disp: 1 each, Rfl: 0 .  Fluticasone-Salmeterol (ADVAIR DISKUS) 250-50 MCG/DOSE AEPB, Inhale 1 puff into the lungs 2 (two) times daily., Disp: 3 each, Rfl: 5 .  glipiZIDE (GLUCOTROL XL) 10 MG  24 hr tablet, Take 1 tablet by mouth  daily with food for  diabetes; take with largest meal of the day, Disp: 90 tablet, Rfl: 4 .  glucose blood (ONE TOUCH ULTRA TEST) test strip, Use as instructed to check blood sugar daily. Dx: E11.21, Disp: 100 each, Rfl: 4 .  losartan (COZAAR) 100 MG tablet, Take 1 tablet by mouth  every day for blood  pressure, Disp: 90 tablet, Rfl: 4 .  Magnesium 400 MG TABS, Take 400 mg by mouth 2 (two) times daily. , Disp: , Rfl:  .  metFORMIN (GLUCOPHAGE) 500 MG tablet, Take 2 tablets (1,000 mg total) by mouth 2 (two) times daily with a meal., Disp: 360 tablet, Rfl: 4 .  MULTIPLE VITAMIN PO, MULTIVITAMINS (Oral Tablet)  1 Every Day for 0 days  Quantity: 0.00;  Refills: 0   Ordered :06-Jan-2010  [name], [name] ;  Started 22-Mar-2009 Active, Disp: , Rfl:  .  Nintedanib (OFEV) 100 MG CAPS, Take 100 mg by mouth 2 (two) times daily with a meal. , Disp: , Rfl:  .  pantoprazole (PROTONIX) 20 MG tablet, Take 20 mg by mouth 2 (two) times daily., Disp: , Rfl:  .  potassium chloride SA (K-DUR,KLOR-CON) 20 MEQ tablet, Take 1 tablet by mouth  daily, Disp: 90 tablet, Rfl: 3 .  pravastatin (PRAVACHOL) 40 MG tablet, Take 1 tablet (  40 mg total) by mouth daily., Disp: 90 tablet, Rfl: 3 .  terazosin (HYTRIN) 5 MG capsule, Take 1 capsule by mouth  every day for prostate, Disp: 90 capsule, Rfl: 4 .  triamterene-hydrochlorothiazide (MAXZIDE-25) 37.5-25 MG tablet, Take 1 tablet by mouth  Every Day for blood  pressure, Disp: 90 tablet, Rfl: 4  Review of Systems  Constitutional: Negative for appetite change, chills and fever.  Respiratory: Negative for chest tightness, shortness of breath and wheezing.   Cardiovascular: Negative for chest pain and palpitations.  Gastrointestinal: Negative for abdominal pain, nausea and vomiting.    Social History   Tobacco Use  . Smoking status: Former Smoker    Packs/day: 2.00    Years: 20.00    Pack years: 40.00    Types: Cigarettes, Pipe    Last  attempt to quit: 11/22/1999    Years since quitting: 17.2  . Smokeless tobacco: Never Used  . Tobacco comment: quit cigs in 70 and pipe in 01  Substance Use Topics  . Alcohol use: Yes    Comment: socially   Objective:   BP 120/60 (BP Location: Right Arm, Patient Position: Sitting, Cuff Size: Large)   Pulse 82   Temp 97.9 F (36.6 C) (Oral)   Resp 16   Ht 5\' 10"  (1.778 m)   Wt 205 lb (93 kg)   SpO2 97%   BMI 29.41 kg/m  Vitals:   01/30/17 1020  BP: 120/60  Pulse: 82  Resp: 16  Temp: 97.9 F (36.6 C)  TempSrc: Oral  SpO2: 97%  Weight: 205 lb (93 kg)  Height: 5\' 10"  (1.778 m)   Fall Risk  01/30/2017 12/28/2015 11/23/2015  Falls in the past year? No No No     Physical Exam  General appearance: alert, well developed, well nourished, cooperative and in no distress Head: Normocephalic, without obvious abnormality, atraumatic Respiratory: Respirations even and unlabored, normal respiratory rate Extremities: No gross deformities Skin: Skin color, texture, turgor normal. No rashes seen  Psych: Appropriate mood and affect. Neurologic: Mental status: Alert, oriented to person, place, and time, thought content appropriate.     Assessment & Plan:     1. TIA (transient ischemic attack) No further episodes since last visit. Tolerating addition of clavicle   2. Hyperlipidemia, unspecified hyperlipidemia type He is tolerating increased dose of pravastatin well with no adverse effects.   - CBC - Lipid panel       Mila Merryonald Andreka Stucki, MD  [facility]

## 2017-03-15 ENCOUNTER — Encounter: Payer: Self-pay | Admitting: *Deleted

## 2017-03-16 ENCOUNTER — Ambulatory Visit: Payer: Self-pay | Admitting: Adult Health

## 2017-03-16 ENCOUNTER — Encounter
Admission: RE | Disposition: A | Discharge status: Home / Self Care | Payer: Self-pay | Source: Ambulatory Visit | Attending: Gastroenterology

## 2017-03-16 ENCOUNTER — Ambulatory Visit: Payer: Medicare Other | Admitting: Anesthesiology

## 2017-03-16 ENCOUNTER — Ambulatory Visit
Admission: RE | Admit: 2017-03-16 | Discharge: 2017-03-16 | Disposition: A | Discharge status: Home / Self Care | Payer: Medicare Other | Source: Ambulatory Visit | Attending: Gastroenterology | Admitting: Gastroenterology

## 2017-03-16 DIAGNOSIS — J45909 Unspecified asthma, uncomplicated: Secondary | ICD-10-CM | POA: Diagnosis not present

## 2017-03-16 DIAGNOSIS — Z7984 Long term (current) use of oral hypoglycemic drugs: Secondary | ICD-10-CM | POA: Diagnosis not present

## 2017-03-16 DIAGNOSIS — E119 Type 2 diabetes mellitus without complications: Secondary | ICD-10-CM | POA: Diagnosis not present

## 2017-03-16 DIAGNOSIS — K5790 Diverticulosis of intestine, part unspecified, without perforation or abscess without bleeding: Secondary | ICD-10-CM | POA: Diagnosis not present

## 2017-03-16 DIAGNOSIS — J8410 Pulmonary fibrosis, unspecified: Secondary | ICD-10-CM | POA: Insufficient documentation

## 2017-03-16 DIAGNOSIS — K5730 Diverticulosis of large intestine without perforation or abscess without bleeding: Secondary | ICD-10-CM | POA: Insufficient documentation

## 2017-03-16 DIAGNOSIS — Z1211 Encounter for screening for malignant neoplasm of colon: Secondary | ICD-10-CM | POA: Insufficient documentation

## 2017-03-16 DIAGNOSIS — Z86010 Personal history of colonic polyps: Secondary | ICD-10-CM | POA: Diagnosis not present

## 2017-03-16 DIAGNOSIS — Z7982 Long term (current) use of aspirin: Secondary | ICD-10-CM | POA: Diagnosis not present

## 2017-03-16 DIAGNOSIS — Z79899 Other long term (current) drug therapy: Secondary | ICD-10-CM | POA: Insufficient documentation

## 2017-03-16 DIAGNOSIS — Z7902 Long term (current) use of antithrombotics/antiplatelets: Secondary | ICD-10-CM | POA: Insufficient documentation

## 2017-03-16 DIAGNOSIS — M109 Gout, unspecified: Secondary | ICD-10-CM | POA: Diagnosis not present

## 2017-03-16 DIAGNOSIS — E785 Hyperlipidemia, unspecified: Secondary | ICD-10-CM | POA: Diagnosis not present

## 2017-03-16 DIAGNOSIS — I2510 Atherosclerotic heart disease of native coronary artery without angina pectoris: Secondary | ICD-10-CM | POA: Insufficient documentation

## 2017-03-16 DIAGNOSIS — Z538 Procedure and treatment not carried out for other reasons: Secondary | ICD-10-CM | POA: Insufficient documentation

## 2017-03-16 DIAGNOSIS — I10 Essential (primary) hypertension: Secondary | ICD-10-CM | POA: Diagnosis not present

## 2017-03-16 DIAGNOSIS — K644 Residual hemorrhoidal skin tags: Secondary | ICD-10-CM | POA: Diagnosis not present

## 2017-03-16 DIAGNOSIS — K219 Gastro-esophageal reflux disease without esophagitis: Secondary | ICD-10-CM | POA: Diagnosis not present

## 2017-03-16 DIAGNOSIS — K640 First degree hemorrhoids: Secondary | ICD-10-CM | POA: Diagnosis not present

## 2017-03-16 DIAGNOSIS — Z87891 Personal history of nicotine dependence: Secondary | ICD-10-CM | POA: Diagnosis not present

## 2017-03-16 HISTORY — PX: COLONOSCOPY WITH PROPOFOL: SHX5780

## 2017-03-16 LAB — GLUCOSE, CAPILLARY: Glucose-Capillary: 116 mg/dL — ABNORMAL HIGH (ref 65–99)

## 2017-03-16 SURGERY — COLONOSCOPY WITH PROPOFOL
Anesthesia: General

## 2017-03-16 MED ORDER — MIDAZOLAM HCL 2 MG/2ML IJ SOLN
INTRAMUSCULAR | Status: DC | PRN
  Administered 2017-03-16: 1 mg via INTRAVENOUS

## 2017-03-16 MED ORDER — SODIUM CHLORIDE 0.9 % IV SOLN: INTRAVENOUS | Status: DC

## 2017-03-16 MED ORDER — PROPOFOL 10 MG/ML IV BOLUS
INTRAVENOUS | Status: DC | PRN
  Administered 2017-03-16: 50 mg via INTRAVENOUS

## 2017-03-16 MED ORDER — PROPOFOL 500 MG/50ML IV EMUL
INTRAVENOUS | Status: DC | PRN
  Administered 2017-03-16: 100 ug/kg/min via INTRAVENOUS

## 2017-03-16 MED ORDER — SODIUM CHLORIDE 0.9 % IV SOLN
INTRAVENOUS | Status: DC
  Administered 2017-03-16 (×2): via INTRAVENOUS

## 2017-03-16 NOTE — Anesthesia Post-op Follow-up Note (Signed)
Anesthesia QCDR form completed.        

## 2017-03-16 NOTE — Anesthesia Preprocedure Evaluation (Addendum)
Anesthesia Evaluation  Patient identified by MRN, date of birth, ID band Patient awake    Reviewed: Allergy & Precautions, NPO status , Patient's Chart, lab work & pertinent test results  History of Anesthesia Complications Negative for: history of anesthetic complications  Airway Mallampati: II       Dental  (+) Upper Dentures, Lower Dentures   Pulmonary asthma , neg sleep apnea, former smoker,  Pulmonary fibrosis          Cardiovascular hypertension, Pt. on medications + CAD  (-) Past MI and (-) CHF (-) dysrhythmias (-) Valvular Problems/Murmurs     Neuro/Psych neg Seizures    GI/Hepatic Neg liver ROS, GERD  Medicated and Controlled,  Endo/Other  diabetes, Type 2, Oral Hypoglycemic Agents  Renal/GU Renal InsufficiencyRenal disease     Musculoskeletal   Abdominal   Peds  Hematology  (+) anemia ,   Anesthesia Other Findings   Reproductive/Obstetrics                           Anesthesia Physical Anesthesia Plan  ASA: III  Anesthesia Plan: General   Post-op Pain Management:    Induction: Intravenous  PONV Risk Score and Plan: 2 and TIVA and Propofol infusion  Airway Management Planned: Nasal Cannula  Additional Equipment:   Intra-op Plan:   Post-operative Plan:   Informed Consent: I have reviewed the patients History and Physical, chart, labs and discussed the procedure including the risks, benefits and alternatives for the proposed anesthesia with the patient or authorized representative who has indicated his/her understanding and acceptance.     Plan Discussed with:   Anesthesia Plan Comments:         Anesthesia Quick Evaluation

## 2017-03-16 NOTE — Op Note (Signed)
[facility] Patient Name: Jeremiah FeilVincent Saulters Procedure Date: 03/16/2017 7:59 AM MRN: [id number] Account #: [id number] Date of Birth: 1935/05/18 Admit Type: Outpatient Age: 82 [facility] Gender: Male Note Status: Finalized Procedure:            Colonoscopy Indications:          Personal history of colonic polyps Providers:            Christena DeemMartin U. Marysa Wessner, MD Referring MD:         Demetrios Isaacsonald E. Sherrie MustacheFisher, MD (Referring MD) Medicines:            Monitored Anesthesia Care Complications:        No immediate complications. Procedure:            Pre-Anesthesia Assessment:                       - ASA Grade Assessment: III - A patient with severe                        systemic disease.                       After obtaining informed consent, the colonoscope was                        passed under direct vision. Throughout the procedure,                        the patient's blood pressure, pulse, and oxygen                        saturations were monitored continuously. The                        Colonoscope was introduced through the anus with the                        intention of advancing to the cecum. The scope was                        advanced to the sigmoid colon before the procedure was                        aborted. Medications were given. The patient tolerated                        the procedure well. The quality of the bowel                        preparation was poor. Findings:      A single medium-mouthed diverticulum was found in the rectum.      A large amount of semi-liquid solid stool was found in the rectum and in       the sigmoid colon, precluding visualization.      The digital rectal exam findings include non-thrombosed external       hemorrhoids and internal hemorrhoids (Grade I). Impression:           - Preparation of the colon was poor.                       -  Diverticulosis in the rectum.                       - Stool in the  rectum and in the sigmoid colon.                       - Non-thrombosed external hemorrhoids and internal                        hemorrhoids (Grade I) found on digital rectal exam.                       - No specimens collected. Recommendation:       - Patient will need reprep and rescheduled.My office to                        contact him.                       - sitz baths for hemorrhoids Christena Deem, MD 03/16/2017 8:14:24 AM This report has been signed electronically. Number of Addenda: 0 Note Initiated On: 03/16/2017 7:59 AM Total Procedure Duration: 0 hours 2 minutes 7 seconds       [facility]

## 2017-03-16 NOTE — Anesthesia Postprocedure Evaluation (Signed)
Anesthesia Post Note  Patient: Jeremiah Gibson  Procedure(s) Performed: COLONOSCOPY WITH PROPOFOL (N/A )  Patient location during evaluation: Endoscopy Anesthesia Type: General Level of consciousness: awake and alert Pain management: pain level controlled Vital Signs Assessment: post-procedure vital signs reviewed and stable Respiratory status: spontaneous breathing and respiratory function stable Cardiovascular status: stable Anesthetic complications: no     Last Vitals:  Vitals:   03/16/17 0705 03/16/17 0810  BP: (!) 156/70 (!) 117/46  Pulse: 66 (!) 57  Resp: 20 20  Temp: (!) 35.8 C 36.4 C  SpO2: 99% 100%    Last Pain:  Vitals:   03/16/17 0810  TempSrc: Tympanic                 Aviela Blundell K

## 2017-03-16 NOTE — H&P (Signed)
Outpatient short stay form Pre-procedure 03/16/2017 8:00 AM Jeremiah Gibson Gibson Jeremiah Schnelle Gibson  Primary Physician: a [place] [facility]  Reason for visit:  Colonoscopy  History of present illness:  Patient is a 82 year old male presenting today as above. Has a personal history of adenomatous colon polyps. Last colonoscopy was 02/19/2012. He tolerated his prep well. He does take Plavix which has been held for 5 days. He takes an 81 mg aspirin which has been held for 3 days. He denies use of any other aspirin or blood thinning agents. He did receive pulmonary evaluation and clearance for procedure.    Current Facility-Administered Medications:  .  0.9 %  sodium chloride infusion, , Intravenous, Continuous, Jeremiah Gibson, Jeremiah Odor Gibson, Gibson, Last Rate: 20 mL/hr at 03/16/17 0739 .  0.9 %  sodium chloride infusion, , Intravenous, Continuous, Jeremiah Gibson, Jeremiah Hornung Gibson, Gibson .  0.9 %  sodium chloride infusion, , Intravenous, Continuous, Jeremiah Gibson, Jeremiah Juneau Gibson, Gibson  Medications Prior to Admission  Medication Sig Dispense Refill Last Dose  . albuterol (VENTOLIN HFA) 108 (90 Base) MCG/ACT inhaler Inhale 2 puffs into the lungs every 4 (four) hours as needed for wheezing or shortness of breath. 3 Inhaler 1 03/16/2017 at Unknown time  . allopurinol (ZYLOPRIM) 100 MG tablet Take 2 tablets (200 mg total) by mouth 2 (two) times daily. 360 tablet 4 03/15/2017 at Unknown time  . amLODipine (NORVASC) 2.5 MG tablet Take 1 tablet (2.5 mg total) by mouth daily. 90 tablet 4 03/16/2017 at Unknown time  . finasteride (PROSCAR) 5 MG tablet Take 1 tablet by mouth  every day 90 tablet 4 03/15/2017 at Unknown time  . Fluticasone-Salmeterol (ADVAIR DISKUS) 250-50 MCG/DOSE AEPB Inhale 1 puff into the lungs 2 (two) times daily. 3 each 5 03/16/2017 at Unknown time  . glucose blood (ONE TOUCH ULTRA TEST) test strip Use as instructed to check blood sugar daily. Dx: E11.21 100 each 4 03/15/2017 at Unknown time  . losartan (COZAAR) 100 MG tablet Take 1 tablet by mouth   every day for blood  pressure 90 tablet 4 03/16/2017 at Unknown time  . pantoprazole (PROTONIX) 20 MG tablet Take 20 mg by mouth 2 (two) times daily.   03/15/2017 at Unknown time  . polyethylene glycol-electrolytes (NULYTELY/GOLYTELY) 420 g solution Take 4,000 mLs by mouth once.     . simvastatin (ZOCOR) 10 MG tablet Take 10 mg by mouth daily.   03/14/2017  . aspirin 81 MG tablet Take 81 mg by mouth every evening.    03/13/2017  . clopidogrel (PLAVIX) 75 MG tablet Take 1 tablet (75 mg total) by mouth daily. 30 tablet 3 03/13/2017  . fish oil-omega-3 fatty acids 1000 MG capsule Take 1 g by mouth 2 (two) times daily.    03/13/2017  . fluticasone furoate-vilanterol (BREO ELLIPTA) 100-25 MCG/INH AEPB Inhale 1 puff into the lungs daily. (Patient not taking: Reported on 03/16/2017) 1 each 0 Not Taking at Unknown time  . glipiZIDE (GLUCOTROL XL) 10 MG 24 hr tablet Take 1 tablet by mouth  daily with food for  diabetes; take with largest meal of the day 90 tablet 4 03/14/2017  . Magnesium 400 MG TABS Take 400 mg by mouth 2 (two) times daily.    03/13/2017  . metFORMIN (GLUCOPHAGE) 500 MG tablet Take 2 tablets (1,000 mg total) by mouth 2 (two) times daily with a meal. 360 tablet 4 03/14/2017  . MULTIPLE VITAMIN PO MULTIVITAMINS (Oral Tablet)  1 Every Day for 0 days  Quantity: 0.00;  Refills: 0  Ordered :06-Jan-2010  [name] ;  Started 22-Mar-2009 Active   03/13/2017  . Nintedanib (OFEV) 100 MG CAPS Take 100 mg by mouth 2 (two) times daily with a meal.    03/13/2017  . potassium chloride SA (K-DUR,KLOR-CON) 20 MEQ tablet Take 1 tablet by mouth  daily 90 tablet 3 03/14/2017  . pravastatin (PRAVACHOL) 40 MG tablet Take 1 tablet (40 mg total) by mouth daily. 90 tablet 3 03/14/2017  . terazosin (HYTRIN) 5 MG capsule Take 1 capsule by mouth  every day for prostate 90 capsule 4 03/14/2017  . triamterene-hydrochlorothiazide (MAXZIDE-25) 37.5-25 MG tablet Take 1 tablet by mouth  Every Day for blood  pressure 90 tablet 4 03/14/2017      No Known Allergies   Past Medical History:  Diagnosis Date  . Anemia   . Arthritis   . Asthma   . Colon polyps   . Cough    CHRONIC  . Diabetes mellitus without complication (HCC)   . Diverticulosis of colon (without mention of hemorrhage)   . Dyspnea    pulmonary fibrosis  . Dysrhythmia   . Esophageal reflux   . Gout   . Hyperlipidemia   . Hypertension   . Impotence of organic origin   . Leg cramps   . Pulmonary fibrosis (HCC)   . Pulmonary fibrosis (HCC)   . Wheezing     Review of systems:      Physical Exam    Heart and lungs: Regular rate and rhythm without rub or gallop, lungs are bilaterally clear.    HEENT: Normocephalic atraumatic eyes are anicteric    Other:     Pertinant exam for procedure: Soft nontender nondistended bowel sounds positive normoactive, protuberant.    Planned proceedures: Colonoscopy and indicated procedures. I have discussed the risks benefits and complications of procedures to include not limited to bleeding, infection, perforation and the risk of sedation and the patient wishes to proceed.    Jeremiah Gibson [facility] 03/16/2017  8:00 AM

## 2017-03-16 NOTE — Transfer of Care (Signed)
Immediate Anesthesia Transfer of Care Note  Patient: Jeremiah Gibson  Procedure(s) Performed: COLONOSCOPY WITH PROPOFOL (N/A )  Patient Location: PACU and Endoscopy Unit  Anesthesia Type:General  Level of Consciousness: sedated  Airway & Oxygen Therapy: Patient Spontanous Breathing and Patient connected to nasal cannula oxygen  Post-op Assessment: Report given to RN and Post -op Vital signs reviewed and stable  Post vital signs: Reviewed and stable  Last Vitals:  Vitals:   03/16/17 0705  BP: (!) 156/70  Pulse: 66  Resp: 20  Temp: (!) 35.8 C  SpO2: 99%    Last Pain:  Vitals:   03/16/17 0705  TempSrc: Tympanic         Complications: No apparent anesthesia complications

## 2017-03-19 ENCOUNTER — Encounter: Payer: Self-pay | Admitting: Gastroenterology

## 2017-03-19 ENCOUNTER — Ambulatory Visit: Payer: Self-pay | Admitting: Pulmonary Disease

## 2017-03-23 ENCOUNTER — Other Ambulatory Visit: Payer: Self-pay | Admitting: Family Medicine

## 2017-03-27 ENCOUNTER — Ambulatory Visit: Payer: Medicare Other | Admitting: Family Medicine

## 2017-03-27 ENCOUNTER — Encounter: Payer: Self-pay | Admitting: Family Medicine

## 2017-03-27 VITALS — BP 122/60 | HR 68 | Temp 98.1°F | Resp 16 | Wt 202.0 lb

## 2017-03-27 DIAGNOSIS — E1121 Type 2 diabetes mellitus with diabetic nephropathy: Secondary | ICD-10-CM | POA: Diagnosis not present

## 2017-03-27 DIAGNOSIS — I10 Essential (primary) hypertension: Secondary | ICD-10-CM

## 2017-03-27 LAB — POCT GLYCOSYLATED HEMOGLOBIN (HGB A1C)
ESTIMATED AVERAGE GLUCOSE: 146
Hemoglobin A1C: 6.7

## 2017-03-27 MED ORDER — GLIPIZIDE ER 5 MG PO TB24: 5.0000 mg | ORAL_TABLET | Freq: Every day | ORAL | 2 refills | Status: DC

## 2017-03-27 NOTE — Progress Notes (Signed)
Patient: Jeremiah Gibson Male    DOB: 27-Mar-1935   82 y.o.   MRN: [id number] Visit Date: 03/27/2017  Today's Provider: Mila Merry, MD   Chief Complaint  Patient presents with  . Diabetes  . Hypertension  . Hyperlipidemia  . Transient Ischemic Attack   Subjective:    HPI  Diabetes Mellitus Type II, Follow-up:   Lab Results  Component Value Date   HGBA1C 7.3 11/21/2016   HGBA1C 7.1 07/17/2016   HGBA1C 7.0 (H) 04/05/2016    Last seen for diabetes 4 months ago.  Management since then includes no changes. He reports good compliance with treatment. He is not having side effects.  Current symptoms include hypoglycemia  and have been worsening. Home blood sugar records: fasting range: 65-70s  Episodes of hypoglycemia? yes - happened twice in the last 4 weeks.    Current Insulin Regimen: none Most Recent Eye Exam: 10/2016 Weight trend: decreasing steadily Prior visit with dietician: no Current diet: well balanced, has been eating smaller portions.  Current exercise: walking  Pertinent Labs:    Component Value Date/Time   CHOL 156 01/30/2017 1042   CHOL 146 04/05/2016 0932   TRIG 110 01/30/2017 1042   HDL 45 01/30/2017 1042   HDL 43 04/05/2016 0932   LDLCALC 71 04/05/2016 0932   CREATININE 1.25 04/05/2016 0932    Wt Readings from Last 3 Encounters:  03/27/17 202 lb (91.6 kg)  03/16/17 208 lb (94.3 kg)  01/30/17 205 lb (93 kg)      Hypertension, follow-up:  BP Readings from Last 3 Encounters:  03/27/17 122/60  03/16/17 (!) 145/66  01/30/17 120/60    He was last seen for hypertension 4 months ago.  BP at that visit was 134/74. Management since that visit includes no changes. He reports good compliance with treatment. He is not having side effects.  He is exercising. He is adherent to low salt diet.   Outside blood pressures are checked at least 3x a week. Patient denies exertional chest pressure/discomfort, lower extremity edema and  palpitations Cardiovascular risk factors include diabetes mellitus and dyslipidemia.   Weight trend: decreasing steadily Wt Readings from Last 3 Encounters:  03/27/17 202 lb (91.6 kg)  03/16/17 208 lb (94.3 kg)  01/30/17 205 lb (93 kg)     Lipid/Cholesterol, Follow-up:   Last seen for this4 months ago.  Management changes since that visit include no changes. . Last Lipid Panel:    Component Value Date/Time   CHOL 156 01/30/2017 1042   CHOL 146 04/05/2016 0932   TRIG 110 01/30/2017 1042   HDL 45 01/30/2017 1042   HDL 43 04/05/2016 0932   CHOLHDL 3.5 01/30/2017 1042   LDLCALC 71 04/05/2016 0932    Risk factors for vascular disease include diabetes mellitus and hypertension  He reports good compliance with treatment.  Wt Readings from Last 3 Encounters:  03/27/17 202 lb (91.6 kg)  03/16/17 208 lb (94.3 kg)  01/30/17 205 lb (93 kg)    TIA, follow up: Patient was last seen in the office 4 months ago. No changes were made in his medications.      No Known Allergies   Current Outpatient Medications:  .  albuterol (VENTOLIN HFA) 108 (90 Base) MCG/ACT inhaler, Inhale 2 puffs into the lungs every 4 (four) hours as needed for wheezing or shortness of breath., Disp: 3 Inhaler, Rfl: 1 .  allopurinol (ZYLOPRIM) 100 MG tablet, Take 2 tablets (200 mg total) by mouth  2 (two) times daily., Disp: 360 tablet, Rfl: 4 .  amLODipine (NORVASC) 2.5 MG tablet, Take 1 tablet (2.5 mg total) by mouth daily., Disp: 90 tablet, Rfl: 4 .  aspirin 81 MG tablet, Take 81 mg by mouth every evening. , Disp: , Rfl:  .  clopidogrel (PLAVIX) 75 MG tablet, Take 1 tablet (75 mg total) by mouth daily., Disp: 30 tablet, Rfl: 3 .  finasteride (PROSCAR) 5 MG tablet, Take 1 tablet by mouth  every day, Disp: 90 tablet, Rfl: 4 .  fish oil-omega-3 fatty acids 1000 MG capsule, Take 1 g by mouth 2 (two) times daily. , Disp: , Rfl:  .  Fluticasone-Salmeterol (ADVAIR DISKUS) 250-50 MCG/DOSE AEPB, Inhale 1 puff into  the lungs 2 (two) times daily., Disp: 3 each, Rfl: 5 .  glipiZIDE (GLUCOTROL XL) 10 MG 24 hr tablet, Take 1 tablet by mouth  daily with food for  diabetes; take with largest meal of the day, Disp: 90 tablet, Rfl: 4 .  glucose blood (ONE TOUCH ULTRA TEST) test strip, Use as instructed to check blood sugar daily. Dx: E11.21, Disp: 100 each, Rfl: 4 .  losartan (COZAAR) 100 MG tablet, Take 1 tablet by mouth  every day for blood  pressure, Disp: 90 tablet, Rfl: 4 .  Magnesium 400 MG TABS, Take 400 mg by mouth 2 (two) times daily. , Disp: , Rfl:  .  metFORMIN (GLUCOPHAGE) 500 MG tablet, TAKE 2 TABLETS BY MOUTH TWICE DAILY WITH A MEAL, Disp: 360 tablet, Rfl: 4 .  MULTIPLE VITAMIN PO, MULTIVITAMINS (Oral Tablet)  1 Every Day for 0 days  Quantity: 0.00;  Refills: 0   Ordered :06-Jan-2010  [name], [name] ;  Started 22-Mar-2009 Active, Disp: , Rfl:  .  Nintedanib (OFEV) 100 MG CAPS, Take 100 mg by mouth 2 (two) times daily with a meal. , Disp: , Rfl:  .  pantoprazole (PROTONIX) 20 MG tablet, Take 20 mg by mouth 2 (two) times daily., Disp: , Rfl:  .  polyethylene glycol-electrolytes (NULYTELY/GOLYTELY) 420 g solution, Take 4,000 mLs by mouth once., Disp: , Rfl:  .  potassium chloride SA (K-DUR,KLOR-CON) 20 MEQ tablet, Take 1 tablet by mouth  daily, Disp: 90 tablet, Rfl: 3 .  pravastatin (PRAVACHOL) 40 MG tablet, Take 1 tablet (40 mg total) by mouth daily., Disp: 90 tablet, Rfl: 3 .  simvastatin (ZOCOR) 10 MG tablet, Take 10 mg by mouth daily., Disp: , Rfl:  .  terazosin (HYTRIN) 5 MG capsule, Take 1 capsule by mouth  every day for prostate, Disp: 90 capsule, Rfl: 4 .  triamterene-hydrochlorothiazide (MAXZIDE-25) 37.5-25 MG tablet, Take 1 tablet by mouth  Every Day for blood  pressure, Disp: 90 tablet, Rfl: 4 .  fluticasone furoate-vilanterol (BREO ELLIPTA) 100-25 MCG/INH AEPB, Inhale 1 puff into the lungs daily. (Patient not taking: Reported on 03/27/2017), Disp: 1 each, Rfl: 0  Review of Systems    Constitutional: Negative for activity change, appetite change, chills, diaphoresis, fatigue, fever and unexpected weight change.  Respiratory: Negative for apnea, cough, chest tightness and shortness of breath.   Cardiovascular: Negative for chest pain, palpitations and leg swelling.  Endocrine: Negative for cold intolerance, heat intolerance, polydipsia, polyphagia and polyuria.  Musculoskeletal: Negative for joint swelling.  Skin: Negative.   Neurological: Negative for dizziness, syncope, weakness, light-headedness, numbness and headaches.    Social History   Tobacco Use  . Smoking status: Former Smoker    Packs/day: 2.00    Years: 20.00    Pack years:  40.00    Types: Cigarettes, Pipe    Last attempt to quit: 11/22/1999    Years since quitting: 17.3  . Smokeless tobacco: Never Used  . Tobacco comment: quit cigs in 70 and pipe in 01  Substance Use Topics  . Alcohol use: Yes    Comment: socially,none last 24hrs   Objective:   BP 122/60 (BP Location: Right Arm, Patient Position: Sitting, Cuff Size: Normal)   Pulse 68   Temp 98.1 F (36.7 C)   Resp 16   Wt 202 lb (91.6 kg)   SpO2 98%   BMI 28.57 kg/m  Vitals:   03/27/17 1001  BP: 122/60  Pulse: 68  Resp: 16  Temp: 98.1 F (36.7 C)  SpO2: 98%  Weight: 202 lb (91.6 kg)     Physical Exam   General Appearance:    Alert, cooperative, no distress  Eyes:    PERRL, conjunctiva/corneas clear, EOM's intact       Lungs:     Clear to auscultation bilaterally, respirations unlabored  Heart:    Regular rate and rhythm  Neurologic:   Awake, alert, oriented x 3. No apparent focal neurological           defect.       Results for orders placed or performed in visit on 03/27/17  POCT glycosylated hemoglobin (Hb A1C)  Result Value Ref Range   Hemoglobin A1C 6.7    Est. average glucose Bld gHb Est-mCnc 146        Assessment & Plan:     1. Diabetes mellitus with nephropathy (HCC) A1c near goal but he is having hyPOlycemic  episodes since improving his diet. Will reduce glipizide to 5mg  daily and follow up to check a1c in 4 months.  - POCT glycosylated hemoglobin (Hb A1C) - glipiZIDE (GLUCOTROL XL) 5 MG 24 hr tablet; Take 1 tablet (5 mg total) by mouth daily with breakfast.  Dispense: 90 tablet; Refill: 2  2. Essential hypertension Well controlled.  Continue current medications.         Mila Merry, MD  [facility]

## 2017-03-28 ENCOUNTER — Ambulatory Visit: Payer: Medicare Other | Admitting: Pulmonary Disease

## 2017-03-28 ENCOUNTER — Encounter: Payer: Self-pay | Admitting: Pulmonary Disease

## 2017-03-28 ENCOUNTER — Other Ambulatory Visit (INDEPENDENT_AMBULATORY_CARE_PROVIDER_SITE_OTHER): Payer: Medicare Other

## 2017-03-28 VITALS — BP 119/64 | HR 70 | Ht 70.0 in | Wt 200.6 lb

## 2017-03-28 DIAGNOSIS — J4530 Mild persistent asthma, uncomplicated: Secondary | ICD-10-CM

## 2017-03-28 DIAGNOSIS — J84112 Idiopathic pulmonary fibrosis: Secondary | ICD-10-CM

## 2017-03-28 DIAGNOSIS — J849 Interstitial pulmonary disease, unspecified: Secondary | ICD-10-CM | POA: Diagnosis not present

## 2017-03-28 LAB — HEPATIC FUNCTION PANEL
ALBUMIN: 4.3 g/dL (ref 3.5–5.2)
ALT: 17 U/L (ref 0–53)
AST: 18 U/L (ref 0–37)
Alkaline Phosphatase: 75 U/L (ref 39–117)
BILIRUBIN TOTAL: 0.5 mg/dL (ref 0.2–1.2)
Bilirubin, Direct: 0.1 mg/dL (ref 0.0–0.3)
Total Protein: 7.3 g/dL (ref 6.0–8.3)

## 2017-03-28 NOTE — Patient Instructions (Signed)
Lung function appears stable. Check liver function test today and every 3 months  Call me if worse

## 2017-03-28 NOTE — Assessment & Plan Note (Signed)
Continue Advair He hardly has had to use rescue inhaler

## 2017-03-28 NOTE — Assessment & Plan Note (Signed)
Lung function appears stable, slight drop in FVC from 87-81% which is within variation He is tolerating OFEV well Check liver function test today and every 3 months

## 2017-03-28 NOTE — Progress Notes (Signed)
   Subjective:    Patient ID: Jeremiah Gibson, male    DOB: Jul 23, 1935, 82 y.o.   MRN: [id number]  HPI  82y.o remote ex smoker for FU of cough variant asthma &ILD  He was evaluated by [place]ology in [place] in 2008 for cough &wheezing &diagnosed with asthma. Review of his spirometry records essentially show mild restriction with FEV1 in the 72% range and normal FEV1 ratio. However spirometry from 08/2006 does show significant bronchodilator response which could be consistent with some reversible disease. On this basis, he is maintained on a regimen of Advair 250/50 twice a day and albuterol as needed for rescue . Allergy testing was negative.   Overall his breathing is been stable, reports mild shortness of breath when exposed to cold air, especially when he was up in [place]  Denies cough or wheezing. Is compliant with Advair. Complains about OFEV being very expensive  Spirometry today shows FVC of 81%      Significant tests/ events  2013 HRCT >>Mild subpleural reticulation/fibrosis, nonspecific. No definite honeycombing . Mild bilateral lower lobe bronchiectasis   HRCT 07/2015 >> UIP pattern, worse  RAST -neg   PFTs 09/2011 - no airway obstruction, nml lung volumes, DLCO 64%   PFTs 11/2012 -slight drop in FVC from 3.8 to 3.5, DLCO increased to 17.9 (57%), TLC dropped to 4.95(76%)  PFT 07/2015 FVC 86%, TLC 67%, DLCO 57% (stable)  PFT 09/2016 FVC 87%, DLCO 59%-stable  Review of Systems neg for any significant sore throat, dysphagia, itching, sneezing, nasal congestion or excess/ purulent secretions, fever, chills, sweats, unintended wt loss, pleuritic or exertional cp, hempoptysis, orthopnea pnd or change in chronic leg swelling. Also denies presyncope, palpitations, heartburn, abdominal pain, nausea, vomiting, diarrhea or change in bowel or urinary habits, dysuria,hematuria, rash, arthralgias, visual complaints, headache, numbness weakness or ataxia.     Objective:   Physical Exam   Gen. Pleasant, well-nourished, in no distress ENT - no thrush, no post nasal drip Neck: No JVD, no thyromegaly, no carotid bruits Lungs: no use of accessory muscles, no dullness to percussion, bibasal rales showingno LFTs checked today rhonchi  Cardiovascular: Rhythm regular, heart sounds  normal, no murmurs or gallops, no peripheral edema Musculoskeletal: No deformities, no cyanosis or clubbing         Assessment & Plan:

## 2017-04-18 ENCOUNTER — Other Ambulatory Visit: Payer: Self-pay | Admitting: Family Medicine

## 2017-04-18 MED ORDER — CLOPIDOGREL BISULFATE 75 MG PO TABS: 75.0000 mg | ORAL_TABLET | Freq: Every day | ORAL | 3 refills | Status: DC

## 2017-04-18 NOTE — Telephone Encounter (Signed)
[facility] faxed a refill request for the following medication. Thanks CC  clopidogrel (PLAVIX) 75 MG tablet

## 2017-04-19 ENCOUNTER — Telehealth: Payer: Self-pay | Admitting: Pulmonary Disease

## 2017-04-19 NOTE — Telephone Encounter (Signed)
Called and spoke with Shawn with [facility]. Shawn wanted to make our office aware that [facility] is transfering pt's prescription for ofev to [facility]. Pt's chart has been updated.  Nothing further is needed.

## 2017-05-02 ENCOUNTER — Telehealth: Payer: Self-pay | Admitting: Pulmonary Disease

## 2017-05-02 MED ORDER — ALBUTEROL SULFATE HFA 108 (90 BASE) MCG/ACT IN AERS: 2.0000 | INHALATION_SPRAY | RESPIRATORY_TRACT | 3 refills | Status: DC | PRN

## 2017-05-02 NOTE — Telephone Encounter (Signed)
Spoke with pharmacist, pt is requesting a 90 day refill on Ventolin.  This has been sent to pharmacy.  Nothing further needed.

## 2017-06-07 DIAGNOSIS — E1121 Type 2 diabetes mellitus with diabetic nephropathy: Secondary | ICD-10-CM | POA: Diagnosis not present

## 2017-06-25 ENCOUNTER — Encounter: Payer: Self-pay | Admitting: Family Medicine

## 2017-06-25 ENCOUNTER — Other Ambulatory Visit: Payer: Self-pay | Admitting: Family Medicine

## 2017-06-25 ENCOUNTER — Ambulatory Visit: Payer: Medicare Other | Admitting: Family Medicine

## 2017-06-25 VITALS — BP 112/58 | HR 79 | Temp 97.5°F | Resp 16 | Wt 194.8 lb

## 2017-06-25 DIAGNOSIS — R05 Cough: Secondary | ICD-10-CM

## 2017-06-25 DIAGNOSIS — J84112 Idiopathic pulmonary fibrosis: Secondary | ICD-10-CM | POA: Diagnosis not present

## 2017-06-25 DIAGNOSIS — J4520 Mild intermittent asthma, uncomplicated: Secondary | ICD-10-CM | POA: Diagnosis not present

## 2017-06-25 DIAGNOSIS — R059 Cough, unspecified: Secondary | ICD-10-CM

## 2017-06-25 MED ORDER — PREDNISONE 20 MG PO TABS: ORAL_TABLET | ORAL | 1 refills | Status: DC

## 2017-06-25 MED ORDER — FLUTICASONE PROPIONATE 50 MCG/ACT NA SUSP: 2.0000 | Freq: Every day | NASAL | 1 refills | Status: AC

## 2017-06-25 NOTE — Patient Instructions (Signed)
Start Claritin and Flonase nasal spray, If not improving add prednisone.

## 2017-06-25 NOTE — Progress Notes (Signed)
Subjective:     Patient ID: Jeremiah Gibson, male   DOB: [birth year], 82 y.o.   MRN: [id number] Chief Complaint  Patient presents with  . Cough    Patient comes in office today with complaints of cough and wheezing for the past two weeks. Patient reports that he has been using his Advar inhaler and albuterol for relief, he reports that he has been taking otc Robitussin for cough.    HPI States he has clear nasal drainage with PND contributing to cough. States he believes he has developed allergies since he moved here from [place]. Reports he is using albuterol inhaler at least twice daily. Paradoxically, states he is sleeping well. Wife has a URI diagnosed by this provider.  Review of Systems     Objective:   Physical Exam  Constitutional: He appears well-developed and well-nourished. No distress.  Ears: T.M's intact without inflammation Throat: tonsils are absent Neck: no cervical adenopathy Lungs: clear     Assessment:    1. Cough: ? Mediated by allergies - fluticasone (FLONASE) 50 MCG/ACT nasal spray; Place 2 sprays into both nostrils daily.  Dispense: 16 g; Refill: 1 - predniSONE (DELTASONE) 20 MG tablet; One pill twice daily for 5 days  Dispense: 10 tablet; Refill: 1  2. IPF (idiopathic pulmonary fibrosis) (HCC)  3. Mild intermittent asthma without complication - predniSONE (DELTASONE) 20 MG tablet; One pill twice daily for 5 days  Dispense: 10 tablet; Refill: 1    Plan:    Start Claritin and steroid nasal spray. If not improving start oral prednisone

## 2017-06-28 ENCOUNTER — Other Ambulatory Visit: Payer: Self-pay | Admitting: Family Medicine

## 2017-06-28 DIAGNOSIS — E871 Hypo-osmolality and hyponatremia: Secondary | ICD-10-CM

## 2017-06-28 DIAGNOSIS — N400 Enlarged prostate without lower urinary tract symptoms: Secondary | ICD-10-CM

## 2017-07-10 ENCOUNTER — Other Ambulatory Visit: Payer: Self-pay | Admitting: Family Medicine

## 2017-07-16 ENCOUNTER — Other Ambulatory Visit: Payer: Self-pay | Admitting: Family Medicine

## 2017-07-16 DIAGNOSIS — M109 Gout, unspecified: Secondary | ICD-10-CM

## 2017-07-16 DIAGNOSIS — I10 Essential (primary) hypertension: Secondary | ICD-10-CM

## 2017-07-23 ENCOUNTER — Encounter: Payer: Self-pay | Admitting: Family Medicine

## 2017-07-23 ENCOUNTER — Ambulatory Visit: Payer: Medicare Other | Admitting: Family Medicine

## 2017-07-23 VITALS — BP 148/72 | HR 74 | Temp 97.8°F | Resp 16 | Wt 198.0 lb

## 2017-07-23 DIAGNOSIS — I10 Essential (primary) hypertension: Secondary | ICD-10-CM

## 2017-07-23 DIAGNOSIS — E1121 Type 2 diabetes mellitus with diabetic nephropathy: Secondary | ICD-10-CM | POA: Diagnosis not present

## 2017-07-23 LAB — POCT GLYCOSYLATED HEMOGLOBIN (HGB A1C)
Est. average glucose Bld gHb Est-mCnc: 154
HEMOGLOBIN A1C: 7

## 2017-07-23 NOTE — Progress Notes (Signed)
Patient: Jeremiah Gibson Male    DOB: October 22, 1935   82 y.o.   MRN: [id number] Visit Date: 07/23/2017  Today's Provider: Mila Merry, MD   Chief Complaint  Patient presents with  . Hypertension  . Diabetes   Subjective:    HPI   Diabetes Mellitus Type II, Follow-up:   Lab Results  Component Value Date   HGBA1C 6.7 03/27/2017   HGBA1C 7.3 11/21/2016   HGBA1C 7.1 07/17/2016   Last seen for diabetes 4 months ago.  Management since then includes; reduced glipizide to 5 mg qd due to hypoglycemic episodes. He reports good compliance with treatment. He is not having side effects.  Current symptoms include none and have been stable. Home blood sugar records: fasting range: 116  Episodes of hypoglycemia? no   Current Insulin Regimen: none Most Recent Eye Exam: 1 year ago Weight trend: stable Prior visit with dietician: no Current diet: in general, an "unhealthy" diet Current exercise: group exercises   ------------------------------------------------------------------------   Hypertension, follow-up:  BP Readings from Last 3 Encounters:  06/25/17 (!) 112/58  03/28/17 119/64  03/27/17 122/60    He was last seen for hypertension 4 months ago.  BP at that visit was 122/60. Management since that visit includes; no changes, well controlled.He reports good compliance with treatment. He is not having side effects.  He is exercising. He is not adherent to low salt diet.   Outside blood pressures are not being checked. He is experiencing none.  Patient denies chest pain, chest pressure/discomfort, claudication, dyspnea, exertional chest pressure/discomfort, fatigue, irregular heart beat, lower extremity edema, near-syncope, orthopnea, palpitations, paroxysmal nocturnal dyspnea, syncope and tachypnea.   Cardiovascular risk factors include advanced age (older than 83 for men, 43 for women), diabetes mellitus, hypertension and male gender.  Use of agents associated with  hypertension: NSAIDS.   ------------------------------------------------------------------------    No Known Allergies   Current Outpatient Medications:  .  albuterol (VENTOLIN HFA) 108 (90 Base) MCG/ACT inhaler, Inhale 2 puffs into the lungs every 4 (four) hours as needed for wheezing or shortness of breath., Disp: 3 Inhaler, Rfl: 3 .  allopurinol (ZYLOPRIM) 100 MG tablet, TAKE 2 TABLETS BY MOUTH TWICE DAILY. GENERIC EQUIVALENT FOR ZYLOPRIM, Disp: 360 tablet, Rfl: 3 .  amLODipine (NORVASC) 2.5 MG tablet, Take 1 tablet (2.5 mg total) by mouth daily., Disp: 90 tablet, Rfl: 4 .  aspirin 81 MG tablet, Take 81 mg by mouth every evening. , Disp: , Rfl:  .  clopidogrel (PLAVIX) 75 MG tablet, TAKE 1 TABLET BY MOUTH DAILY GENERIC EQUIVALENT FOR PLAVIX, Disp: 90 tablet, Rfl: 4 .  finasteride (PROSCAR) 5 MG tablet, TAKE 1 TABLET BY MOUTH EVERY DAY. GENERIC EQUIVALENT FOR PROSCAR, Disp: 90 tablet, Rfl: 4 .  fish oil-omega-3 fatty acids 1000 MG capsule, Take 1 g by mouth 2 (two) times daily. , Disp: , Rfl:  .  fluticasone (FLONASE) 50 MCG/ACT nasal spray, Place 2 sprays into both nostrils daily., Disp: 16 g, Rfl: 1 .  Fluticasone-Salmeterol (ADVAIR DISKUS) 250-50 MCG/DOSE AEPB, Inhale 1 puff into the lungs 2 (two) times daily., Disp: 3 each, Rfl: 5 .  glipiZIDE (GLUCOTROL XL) 5 MG 24 hr tablet, Take 1 tablet (5 mg total) by mouth daily with breakfast., Disp: 90 tablet, Rfl: 2 .  glucose blood (ONE TOUCH ULTRA TEST) test strip, Use as instructed to check blood sugar daily. Dx: E11.21, Disp: 100 each, Rfl: 4 .  losartan (COZAAR) 100 MG tablet, TAKE 1 TABLET  BY MOUTH EVERY DAY FOR BLOOD PRESSURE, Disp: 90 tablet, Rfl: 3 .  Magnesium 400 MG TABS, Take 400 mg by mouth 2 (two) times daily. , Disp: , Rfl:  .  metFORMIN (GLUCOPHAGE) 500 MG tablet, TAKE 2 TABLETS BY MOUTH TWICE DAILY WITH A MEAL, Disp: 360 tablet, Rfl: 4 .  MULTIPLE VITAMIN PO, MULTIVITAMINS (Oral Tablet)  1 Every Day for 0 days  Quantity: 0.00;   Refills: 0   Ordered :06-Jan-2010  [name] ;  Started 22-Mar-2009 Active, Disp: , Rfl:  .  Nintedanib (OFEV) 100 MG CAPS, Take 100 mg by mouth 2 (two) times daily with a meal. , Disp: , Rfl:  .  pantoprazole (PROTONIX) 20 MG tablet, Take 20 mg by mouth 2 (two) times daily., Disp: , Rfl:  .  polyethylene glycol-electrolytes (NULYTELY/GOLYTELY) 420 g solution, Take 4,000 mLs by mouth once., Disp: , Rfl:  .  potassium chloride SA (K-DUR,KLOR-CON) 20 MEQ tablet, TAKE 1 TABLET BY MOUTH DAILY, Disp: 90 tablet, Rfl: 4 .  pravastatin (PRAVACHOL) 40 MG tablet, Take 1 tablet (40 mg total) by mouth daily., Disp: 90 tablet, Rfl: 3 .  terazosin (HYTRIN) 5 MG capsule, TAKE 1 CAPSULE BY MOUTH EVERY DAY FOR PROSTATE, Disp: 90 capsule, Rfl: 4 .  triamterene-hydrochlorothiazide (MAXZIDE-25) 37.5-25 MG tablet, Take 1 tablet by mouth  Every Day for blood  pressure, Disp: 90 tablet, Rfl: 4  Review of Systems  Constitutional: Negative for appetite change, chills and fever.  Respiratory: Negative for chest tightness, shortness of breath and wheezing.   Cardiovascular: Negative for chest pain and palpitations.  Gastrointestinal: Negative for abdominal pain, nausea and vomiting.    Social History   Tobacco Use  . Smoking status: Former Smoker    Packs/day: 2.00    Years: 20.00    Pack years: 40.00    Types: Cigarettes, Pipe    Last attempt to quit: 11/22/1999    Years since quitting: 17.6  . Smokeless tobacco: Never Used  . Tobacco comment: quit cigs in 70 and pipe in 01  Substance Use Topics  . Alcohol use: Yes    Comment: socially,none last 24hrs   Objective:   BP (!) 148/72 (BP Location: Left Arm, Patient Position: Sitting, Cuff Size: Large)   Pulse 74   Temp 97.8 F (36.6 C) (Oral)   Resp 16   Wt 198 lb (89.8 kg)   SpO2 97% Comment: room air  BMI 28.41 kg/m     Physical Exam   General Appearance:    Alert, cooperative, no distress  Eyes:    PERRL, conjunctiva/corneas clear, EOM's  intact       Lungs:     Clear to auscultation bilaterally, respirations unlabored  Heart:    Regular rate and rhythm  Neurologic:   Awake, alert, oriented x 3. No apparent focal neurological           defect.       Results for orders placed or performed in visit on 07/23/17  POCT HgB A1C  Result Value Ref Range   Hemoglobin A1C 7.0    Est. average glucose Bld gHb Est-mCnc 154        Assessment & Plan:     1. Diabetes mellitus with nephropathy (HCC) Well controlled.  Continue current medications.   - POCT HgB A1C  2. Essential hypertension Stable Continue current medications.   Return in about 4 months (around 11/23/2017).        Mila Merry, MD  [facility]  

## 2017-09-26 ENCOUNTER — Encounter: Payer: Self-pay | Admitting: Adult Health

## 2017-09-26 ENCOUNTER — Telehealth: Payer: Self-pay | Admitting: Pulmonary Disease

## 2017-09-26 ENCOUNTER — Ambulatory Visit: Payer: Medicare Other | Admitting: Adult Health

## 2017-09-26 ENCOUNTER — Ambulatory Visit (INDEPENDENT_AMBULATORY_CARE_PROVIDER_SITE_OTHER)
Admission: RE | Admit: 2017-09-26 | Discharge: 2017-09-26 | Disposition: A | Discharge status: Home / Self Care | Payer: Medicare Other | Source: Ambulatory Visit | Attending: Adult Health | Admitting: Adult Health

## 2017-09-26 ENCOUNTER — Other Ambulatory Visit (INDEPENDENT_AMBULATORY_CARE_PROVIDER_SITE_OTHER): Payer: Medicare Other

## 2017-09-26 VITALS — BP 144/82 | HR 86 | Ht 70.5 in | Wt 196.2 lb

## 2017-09-26 DIAGNOSIS — J4520 Mild intermittent asthma, uncomplicated: Secondary | ICD-10-CM | POA: Diagnosis not present

## 2017-09-26 DIAGNOSIS — R918 Other nonspecific abnormal finding of lung field: Secondary | ICD-10-CM | POA: Diagnosis not present

## 2017-09-26 DIAGNOSIS — J849 Interstitial pulmonary disease, unspecified: Secondary | ICD-10-CM | POA: Diagnosis not present

## 2017-09-26 DIAGNOSIS — J84112 Idiopathic pulmonary fibrosis: Secondary | ICD-10-CM | POA: Diagnosis not present

## 2017-09-26 LAB — HEPATIC FUNCTION PANEL
ALK PHOS: 67 U/L (ref 39–117)
ALT: 18 U/L (ref 0–53)
AST: 18 U/L (ref 0–37)
Albumin: 4.1 g/dL (ref 3.5–5.2)
BILIRUBIN DIRECT: 0.1 mg/dL (ref 0.0–0.3)
TOTAL PROTEIN: 7.2 g/dL (ref 6.0–8.3)
Total Bilirubin: 0.5 mg/dL (ref 0.2–1.2)

## 2017-09-26 NOTE — Progress Notes (Signed)
 @Patient  ID: Jeremiah Gibson, male    DOB: [birth year], 82 y.o.   MRN: [id number]  Chief Complaint  Patient presents with  . Follow-up    ILD    Referring provider: Malva LimesFisher, Jeremiah E, MD  HPI: 82 year old male former smoker followed for ILD and cough variant [place]  TEST  mild restriction with FEV1 in the 72% range and normal FEV1 ratio. However spirometry from 08/2006 does show significant bronchodilator response 03/2017 Spirometry today shows FVC of 81%   2013 HRCT >>Mild subpleural reticulation/fibrosis, nonspecific. No definite honeycombing . Mild bilateral lower lobe bronchiectasis   HRCT 07/2015 >> UIP pattern, worse  RAST -neg   PFTs 09/2011 - no airway obstruction, nml lung volumes, DLCO 64%   PFTs 11/2012 -slight drop in FVC from 3.8 to 3.5, DLCO increased to 17.9 (57%), TLC dropped to 4.95(76%)  PFT 07/2015 FVC 86%, TLC 67%, DLCO 57% (stable)  PFT 09/2016 FVC 87%, DLCO 59%-stable  09/26/2017 Follow up : ILD , Asthma  Patient returns for a six-month follow-up.  Patient has underlying ILD and asthma. Says overall breathing is doing about the same.  He gets winded with heavy activity.  Has noticed over the last few weeks that he has had increased cough production with white to clear mucus.  He denies any fever discolored mucus chest pain orthopnea PND or leg swelling. He remains on Advair twice daily.  Does notice some occasional scratchy throat.  We discussed oral care. Patient says that he previously had done pulmonary rehab and felt like it really helped his strength.  Would like to a referral back to them to help increase his strength again. Him [place] his [place] are going to [place] later this fall for their 55-year anniversary. Patient remains OFEV.  Says appetite is been good.  Weight is been stable.  Does have occasional loose stools.  Has to take Imodium rarely. Says [place] from site. Is going to get in touch with foundation to report ILD dx .    No Known Allergies  Immunization History  Administered Date(s) Administered  . Influenza Whole 12/12/2010, 12/12/2011  . Influenza, High Dose Seasonal PF 12/28/2015, 11/21/2016  . Influenza,inj,Quad PF,6+ Mos 11/25/2012  . Influenza-Unspecified 01/12/2015  . Pneumococcal Conjugate-13 10/06/2013  . Pneumococcal Polysaccharide-23 03/14/2007  . Td 01/01/2009  . Tdap 10/12/2011  . Zoster 06/30/2010    Past Medical History:  Diagnosis Date  . Anemia   . Arthritis   . Asthma   . Colon polyps   . Cough    CHRONIC  . Diabetes mellitus without complication (HCC)   . Diverticulosis of colon (without mention of hemorrhage)   . Dyspnea    pulmonary fibrosis  . Dysrhythmia   . Esophageal reflux   . Gout   . Hyperlipidemia   . Hypertension   . Impotence of organic origin   . Leg cramps   . Pulmonary fibrosis (HCC)   . Pulmonary fibrosis (HCC)   . Wheezing     Tobacco History: Social History   Tobacco Use  Smoking Status Former Smoker  . Packs/day: 2.00  . Years: 20.00  . Pack years: 40.00  . Types: Cigarettes, Pipe  . Last attempt to quit: 11/22/1999  . Years since quitting: 17.8  Smokeless Tobacco Never Used  Tobacco Comment   quit cigs in 70 and pipe in 01  Counseling given: Not Answered Comment: quit cigs in 70 and pipe in 01   Outpatient Medications Prior to Visit  Medication Sig Dispense Refill  . albuterol (VENTOLIN HFA) 108 (90 Base) MCG/ACT inhaler Inhale 2 puffs into the lungs every 4 (four) hours as needed for wheezing or shortness of breath. 3 Inhaler 3  . allopurinol (ZYLOPRIM) 100 MG tablet TAKE 2 TABLETS BY MOUTH TWICE DAILY. GENERIC EQUIVALENT FOR ZYLOPRIM 360 tablet 3  . amLODipine (NORVASC) 2.5 MG tablet Take 1 tablet (2.5 mg total) by mouth daily. 90 tablet 4  . aspirin 81 MG tablet Take 81 mg by mouth every evening.     . clopidogrel (PLAVIX) 75 MG  tablet TAKE 1 TABLET BY MOUTH DAILY GENERIC EQUIVALENT FOR PLAVIX 90 tablet 4  . finasteride (PROSCAR) 5 MG tablet TAKE 1 TABLET BY MOUTH EVERY DAY. GENERIC EQUIVALENT FOR PROSCAR 90 tablet 4  . fish oil-omega-3 fatty acids 1000 MG capsule Take 1 g by mouth 2 (two) times daily.     . fluticasone (FLONASE) 50 MCG/ACT nasal spray Place 2 sprays into both nostrils daily. 16 g 1  . Fluticasone-Salmeterol (ADVAIR DISKUS) 250-50 MCG/DOSE AEPB Inhale 1 puff into the lungs 2 (two) times daily. 3 each 5  . glipiZIDE (GLUCOTROL XL) 5 MG 24 hr tablet Take 1 tablet (5 mg total) by mouth daily with breakfast. 90 tablet 2  . glucose blood (ONE TOUCH ULTRA TEST) test strip Use as instructed to check blood sugar daily. Dx: E11.21 100 each 4  . losartan (COZAAR) 100 MG tablet TAKE 1 TABLET BY MOUTH EVERY DAY FOR BLOOD PRESSURE 90 tablet 3  . Magnesium 400 MG TABS Take 400 mg by mouth 2 (two) times daily.     . metFORMIN (GLUCOPHAGE) 500 MG tablet TAKE 2 TABLETS BY MOUTH TWICE DAILY WITH A MEAL 360 tablet 4  . MULTIPLE VITAMIN PO MULTIVITAMINS (Oral Tablet)  1 Every Day for 0 days  Quantity: 0.00;  Refills: 0   Ordered :06-Jan-2010  Jeremiah Gibson ;  Started 22-Mar-2009 Active    . Nintedanib (OFEV) 100 MG CAPS Take 100 mg by mouth 2 (two) times daily with a meal.     . pantoprazole (PROTONIX) 20 MG tablet Take 20 mg by mouth 2 (two) times daily.    . polyethylene glycol-electrolytes (NULYTELY/GOLYTELY) 420 g solution Take 4,000 mLs by mouth once.    . potassium chloride SA (K-DUR,KLOR-CON) 20 MEQ tablet TAKE 1 TABLET BY MOUTH DAILY 90 tablet 4  . pravastatin (PRAVACHOL) 40 MG tablet Take 1 tablet (40 mg total) by mouth daily. 90 tablet 3  . terazosin (HYTRIN) 5 MG capsule TAKE 1 CAPSULE BY MOUTH EVERY DAY FOR PROSTATE 90 capsule 4  . triamterene-hydrochlorothiazide (MAXZIDE-25) 37.5-25 MG tablet Take 1 tablet by mouth  Every Day for blood  pressure 90 tablet 4   No facility-administered medications prior to  visit.      Review of Systems  Constitutional:   No  weight loss, night sweats,  Fevers, chills,  +fatigue, or  lassitude.  HEENT:   No headaches,  Difficulty swallowing,  Tooth/dental problems, or  Sore throat,                No sneezing, itching, ear ache, nasal congestion, post nasal drip,   CV:  No chest pain,  Orthopnea, PND, swelling in lower extremities, anasarca, dizziness, palpitations, syncope.   GI  No heartburn, indigestion, abdominal pain, nausea, vomiting, diarrhea, change in bowel habits, loss of  appetite, bloody stools. +loose stools   Resp:   No chest wall deformity  Skin: no rash or lesions.  GU: no dysuria, change in color of urine, no urgency or frequency.  No flank pain, no hematuria   MS:  No joint pain or swelling.  No decreased range of motion.  No back pain.    Physical Exam  BP (!) 144/82 (BP Location: Left Arm, Cuff Size: Normal)   Pulse 86   Ht 5' 10.5" (1.791 m)   Wt 196 lb 3.2 oz (89 kg)   SpO2 98%   BMI 27.75 kg/m   GEN: A/Ox3; pleasant , NAD, elderly    HEENT:  [place]/AT,  EACs-clear, TMs-wnl, NOSE-clear, THROAT-clear, no lesions, no postnasal drip or exudate noted.   NECK:  Supple w/ fair ROM; no JVD; normal carotid impulses w/o bruits; no thyromegaly or nodules palpated; no lymphadenopathy.    RESP faint bibasilar crackles . no accessory muscle use, no dullness to percussion  CARD:  RRR, no m/r/g, tr  peripheral edema, pulses intact, no cyanosis or clubbing.  GI:   Soft & nt; nml bowel sounds; no organomegaly or masses detected.   Musco: Warm bil, no deformities or joint swelling noted.   Neuro: alert, no focal deficits noted.    Skin: Warm, no lesions or rashes    Lab Results:   BMET   BNP No results found for: BNP  ProBNP No results found for: PROBNP  Imaging: No results found.   Assessment & Plan:   IPF (idiopathic pulmonary fibrosis) (HCC) Appears clinically stable . Check cxr today  Add mucinex dm Twice  daily  As needed   Cont on OFEV -check LFT   Plan  Patient Instructions  Chest xray today .  Labs today .  Continue on Advair 1 puff Twice daily  , rinse well after use.  Pulmonary Rehab referral .  Mucinex DM Twice daily  As needed  Cough /congestion  Follow up Dr. Vassie Loll  In 6 months and As needed        Airway hyperreactivity Asthma - continue on Advair  Oral care discussed   Plan  . Patient Instructions  Chest xray today .  Labs today .  Continue on Advair 1 puff Twice daily  , rinse well after use.  Pulmonary Rehab referral .  Mucinex DM Twice daily  As needed  Cough /congestion  Follow up Dr. Vassie Loll  In 6 months and As needed           Rubye Oaks, NP 09/26/2017

## 2017-09-26 NOTE — Telephone Encounter (Signed)
Jeremiah [facility] [employer] [facility] de[facility] called - she can be reached at [phone] option 5 -pr

## 2017-09-26 NOTE — Patient Instructions (Signed)
Chest xray today .  Labs today .  Continue on Advair 1 puff Twice daily  , rinse well after use.  Pulmonary Rehab referral .  Mucinex DM Twice daily  As needed  Cough /congestion  Follow up Dr. Vassie LollAlva  In 6 months and As needed

## 2017-09-26 NOTE — Telephone Encounter (Signed)
Ofev PA renewal done with Bryan LemmaNakia Sent for review  Called spoke with patient's spouse, she is aware Will await PA response

## 2017-09-26 NOTE — Assessment & Plan Note (Signed)
Asthma - continue on Advair  Oral care discussed   Plan  . Patient Instructions  Chest xray today .  Labs today .  Continue on Advair 1 puff Twice daily  , rinse well after use.  Pulmonary Rehab referral .  Mucinex DM Twice daily  As needed  Cough /congestion  Follow up Dr. Vassie LollAlva  In 6 months and As needed

## 2017-09-26 NOTE — Assessment & Plan Note (Signed)
Appears clinically stable . Check cxr today  Add mucinex dm Twice daily  As needed   Cont on OFEV -check LFT   Plan  Patient Instructions  Chest xray today .  Labs today .  Continue on Advair 1 puff Twice daily  , rinse well after use.  Pulmonary Rehab referral .  Mucinex DM Twice daily  As needed  Cough /congestion  Follow up Dr. Vassie LollAlva  In 6 months and As needed

## 2017-09-26 NOTE — Telephone Encounter (Signed)
Patient seen today by TP for follow up on IPF While here, patient stated that he is due for refill on his Ofev and stated that a fax had been sent to [facility] from [facility] Advised patient will check on this while he is here  No sign of fax Called [facility] @ [phone] and spoke with pharmacist Joni ReiningNicole.  Per Joni ReiningNicole, patient's Rx expires on 8.15.19 and will need a new Rx.  Per Joni ReiningNicole, verbal is okay >> verbal given for Ofev 100mg  BID #60, 5 refills.  Joni Reiningicole also stated something is needed regarding pt's insurance and transferred me.  Spoke with Zara in [facility]'s insruance dept >> a PA is needed.  Call [phone].  Called [employer] and spoke with Mayata T.  Unable to do PA over the phone as that dept is in a meeting but Mayata to put in a message for that dept to call back this afternoon.  Will route back to myself for follow up.

## 2017-09-28 NOTE — Progress Notes (Signed)
Called spoke with patient, advised of lab results / recs as stated by TP.  Pt verbalized understanding and denied any questions. 

## 2017-09-28 NOTE — Telephone Encounter (Signed)
Called [employer] and spoke with Denine J >> Ofev was approved from 09/26/17 - 09/27/18.  Patient's specialty pharmacy was contacted but the medication has not been shipped.  [employer] and spoke with pharmacist Nita SellsMaryAnn >> they received the approval and the medication is scheduled to ship on 7/25.  Asked Nita SellsMaryAnn if the Ofev can be shipped sooner as patient will be out of town.  Per Nita SellsMaryAnn, yes but patient will need to call to coordinate.  LMOM TCB x1 for patient to discuss the above.

## 2017-09-28 NOTE — Telephone Encounter (Signed)
Called spoke with patient, discussed the below Patient was already aware that the Ofev was approved and the scheduled shipment for 7/25 is for [place]   Per patient nothing further needed Will sign off

## 2017-09-28 NOTE — Progress Notes (Signed)
Called spoke with patient, advised of cxr results / recs as stated by TP.  Pt verbalized understanding and denied any questions.

## 2017-09-28 NOTE — Telephone Encounter (Signed)
Pt is calling back [phone]

## 2017-10-04 ENCOUNTER — Telehealth: Payer: Self-pay | Admitting: Pulmonary Disease

## 2017-10-04 NOTE — Telephone Encounter (Signed)
LMTCB x 1 

## 2017-10-04 NOTE — Telephone Encounter (Signed)
He has been on ofev since 07/2015. I noted that he is on a low-dose of 100 twice daily. I do not remember him having any side effects with this He has tolerated this well and would suggest increasing this to 150 twice daily which is the full dose.

## 2017-10-24 ENCOUNTER — Telehealth: Payer: Self-pay | Admitting: Internal Medicine

## 2017-10-24 NOTE — Telephone Encounter (Signed)
LMTCB x1 to discuss the Pulmonary Rehab study. Dr. Vassie LollAlva referred this subject to [facility] as a potential candidate for this trial. Will await the subjects call back.   Carron CurieJennifer Castillo, CMA, [facility], [place] [phone]

## 2017-10-30 ENCOUNTER — Telehealth (HOSPITAL_COMMUNITY): Payer: Self-pay

## 2017-10-30 NOTE — Telephone Encounter (Signed)
Patient called and left message yesterday afternoon in regards to PR. Attempted to return patients phone call this morning - lm on vm

## 2017-11-02 DIAGNOSIS — E119 Type 2 diabetes mellitus without complications: Secondary | ICD-10-CM | POA: Diagnosis not present

## 2017-11-02 LAB — HM DIABETES EYE EXAM

## 2017-11-06 ENCOUNTER — Encounter: Payer: Self-pay | Admitting: *Deleted

## 2017-11-06 NOTE — Telephone Encounter (Signed)
Pt returned my call. I advised him he did not qualify at this time for the Rehab study. Pt states he wants to go ahead and start pulmonary rehab and does not want to wait for the study.

## 2017-11-13 ENCOUNTER — Encounter: Payer: Medicare Other | Attending: Pulmonary Disease

## 2017-11-13 ENCOUNTER — Other Ambulatory Visit: Payer: Self-pay

## 2017-11-13 VITALS — Ht 70.5 in | Wt 197.7 lb

## 2017-11-13 DIAGNOSIS — J84112 Idiopathic pulmonary fibrosis: Secondary | ICD-10-CM | POA: Diagnosis not present

## 2017-11-13 DIAGNOSIS — J849 Interstitial pulmonary disease, unspecified: Secondary | ICD-10-CM | POA: Diagnosis present

## 2017-11-13 NOTE — Progress Notes (Addendum)
Pulmonary Individual Treatment Plan  Patient Details  Name: Jeremiah Gibson MRN: [id number] Date of Birth: 12/15/1935 Referring Provider:     Pulmonary Rehab from 11/13/2017 in [facility] Cardiac and Pulmonary Rehab  Referring Provider  Alba      Initial Encounter Date:    Pulmonary Rehab from 11/13/2017 in [facility] Cardiac and Pulmonary Rehab  Date  11/13/17      Visit Diagnosis: Interstitial lung disease ([place])  Patient's Home Medications on Admission:  Current Outpatient Medications:  .  albuterol (VENTOLIN HFA) 108 (90 Base) MCG/ACT inhaler, Inhale 2 puffs into the lungs every 4 (four) hours as needed for wheezing or shortness of breath., Disp: 3 Inhaler, Rfl: 3 .  allopurinol (ZYLOPRIM) 100 MG tablet, TAKE 2 TABLETS BY MOUTH TWICE DAILY. GENERIC EQUIVALENT FOR ZYLOPRIM, Disp: 360 tablet, Rfl: 3 .  amLODipine (NORVASC) 2.5 MG tablet, Take 1 tablet (2.5 mg total) by mouth daily., Disp: 90 tablet, Rfl: 4 .  aspirin 81 MG tablet, Take 81 mg by mouth every evening. , Disp: , Rfl:  .  clopidogrel (PLAVIX) 75 MG tablet, TAKE 1 TABLET BY MOUTH DAILY GENERIC EQUIVALENT FOR PLAVIX, Disp: 90 tablet, Rfl: 4 .  finasteride (PROSCAR) 5 MG tablet, TAKE 1 TABLET BY MOUTH EVERY DAY. GENERIC EQUIVALENT FOR PROSCAR, Disp: 90 tablet, Rfl: 4 .  fish oil-omega-3 fatty acids 1000 MG capsule, Take 1 g by mouth 2 (two) times daily. , Disp: , Rfl:  .  fluticasone (FLONASE) 50 MCG/ACT nasal spray, Place 2 sprays into both nostrils daily., Disp: 16 g, Rfl: 1 .  Fluticasone-Salmeterol (ADVAIR DISKUS) 250-50 MCG/DOSE AEPB, Inhale 1 puff into the lungs 2 (two) times daily., Disp: 3 each, Rfl: 5 .  glipiZIDE (GLUCOTROL XL) 5 MG 24 hr tablet, Take 1 tablet (5 mg total) by mouth daily with breakfast., Disp: 90 tablet, Rfl: 2 .  glucose blood (ONE TOUCH ULTRA TEST) test strip, Use as instructed to check blood sugar daily. Dx: E11.21, Disp: 100 each, Rfl: 4 .  losartan (COZAAR) 100 MG tablet, TAKE 1 TABLET BY MOUTH EVERY DAY FOR  BLOOD PRESSURE, Disp: 90 tablet, Rfl: 3 .  Magnesium 400 MG TABS, Take 400 mg by mouth 2 (two) times daily. , Disp: , Rfl:  .  metFORMIN (GLUCOPHAGE) 500 MG tablet, TAKE 2 TABLETS BY MOUTH TWICE DAILY WITH A MEAL, Disp: 360 tablet, Rfl: 4 .  MULTIPLE VITAMIN PO, MULTIVITAMINS (Oral Tablet)  1 Every Day for 0 days  Quantity: 0.00;  Refills: 0   Ordered :06-Jan-2010  Doy Hutching ;  Started 22-Mar-2009 Active, Disp: , Rfl:  .  Nintedanib (OFEV) 100 MG CAPS, Take 100 mg by mouth 2 (two) times daily with a meal. , Disp: , Rfl:  .  pantoprazole (PROTONIX) 20 MG tablet, Take 20 mg by mouth 2 (two) times daily., Disp: , Rfl:  .  polyethylene glycol-electrolytes (NULYTELY/GOLYTELY) 420 g solution, Take 4,000 mLs by mouth once., Disp: , Rfl:  .  potassium chloride SA (K-DUR,KLOR-CON) 20 MEQ tablet, TAKE 1 TABLET BY MOUTH DAILY, Disp: 90 tablet, Rfl: 4 .  pravastatin (PRAVACHOL) 40 MG tablet, Take 1 tablet (40 mg total) by mouth daily., Disp: 90 tablet, Rfl: 3 .  terazosin (HYTRIN) 5 MG capsule, TAKE 1 CAPSULE BY MOUTH EVERY DAY FOR PROSTATE, Disp: 90 capsule, Rfl: 4 .  triamterene-hydrochlorothiazide (MAXZIDE-25) 37.5-25 MG tablet, Take 1 tablet by mouth  Every Day for blood  pressure, Disp: 90 tablet, Rfl: 4  Past Medical History: Past Medical History:  Diagnosis Date  . Anemia   . Arthritis   . Asthma   . Colon polyps   . Cough    CHRONIC  . Diabetes mellitus without complication ([place])   . Diverticulosis of colon (without mention of hemorrhage)   . Dyspnea    pulmonary fibrosis  . Dysrhythmia   . Esophageal reflux   . Gout   . Hyperlipidemia   . Hypertension   . Impotence of organic origin   . Leg cramps   . Pulmonary fibrosis ([place])   . Pulmonary fibrosis ([place])   . Wheezing     Tobacco Use: Social History   Tobacco Use  Smoking Status Former Smoker  . Packs/day: 2.00  . Years: 20.00  . Pack years: 40.00  . Types: Cigarettes, Pipe  . Last attempt to quit: 11/22/1999  . Years  since quitting: 17.9  Smokeless Tobacco Never Used  Tobacco Comment   quit cigs in 72 and pipe in 01    Labs: Recent Review Flowsheet Data    Labs for ITP Cardiac and Pulmonary Rehab Latest Ref Rng & Units 07/17/2016 11/21/2016 01/30/2017 03/27/2017 07/23/2017   Cholestrol <200 mg/dL - - 156 - -   LDLCALC mg/dL (calc) - - 90 - -   HDL >40 mg/dL - - 45 - -   Trlycerides <150 mg/dL - - 110 - -   Hemoglobin A1c - 7.1 7.3 - 6.7 7.0       Pulmonary Assessment Scores: Pulmonary Assessment Scores    Row Name 11/13/17 1058         ADL UCSD   ADL Phase  Entry     SOB Score total  10     Rest  0     Walk  0     Stairs  0     Bath  0     Dress  0     Shop  0       CAT Score   CAT Score  9       mMRC Score   mMRC Score  1        Pulmonary Function Assessment: Pulmonary Function Assessment - 11/13/17 1121      Breath   Bilateral Breath Sounds  Clear;Decreased    Shortness of Breath  Yes       Exercise Target Goals: Exercise Program Goal: Individual exercise prescription set using results from initial 6 min walk test and THRR while considering  patient's activity barriers and safety.   Exercise Prescription Goal: Initial exercise prescription builds to 30-45 minutes a day of aerobic activity, 2-3 days per week.  Home exercise guidelines will be given to patient during program as part of exercise prescription that the participant will acknowledge.  Activity Barriers & Risk Stratification:   6 Minute Walk: 6 Minute Walk    Row Name 11/13/17 1201         6 Minute Walk   Phase  Initial     Distance  1300 feet     Walk Time  6 minutes     # of Rest Breaks  0     MPH  2.46     METS  2.8     RPE  11     Perceived Dyspnea   1     VO2 Peak  9.8     Symptoms  No     Resting HR  82 bpm     Resting BP  124/64  Resting Oxygen Saturation   96 %     Exercise Oxygen Saturation  during 6 min walk  94 %     Max Ex. HR  108 bpm     Max Ex. BP  174/84     2 Minute Post  BP  144/64       Interval HR   1 Minute HR  87     2 Minute HR  96     3 Minute HR  96     4 Minute HR  104     5 Minute HR  108     6 Minute HR  103     2 Minute Post HR  90     Interval Heart Rate?  Yes       Interval Oxygen   Interval Oxygen?  Yes     Baseline Oxygen Saturation %  96 %     1 Minute Oxygen Saturation %  94 %     1 Minute Liters of Oxygen  0 L     2 Minute Oxygen Saturation %  94 %     2 Minute Liters of Oxygen  0 L     3 Minute Oxygen Saturation %  94 %     3 Minute Liters of Oxygen  0 L     4 Minute Oxygen Saturation %  95 %     4 Minute Liters of Oxygen  0 L     5 Minute Oxygen Saturation %  94 %     5 Minute Liters of Oxygen  0 L     6 Minute Oxygen Saturation %  94 %     6 Minute Liters of Oxygen  0 L     2 Minute Post Oxygen Saturation %  97 %     2 Minute Post Liters of Oxygen  0 L       Oxygen Initial Assessment: Oxygen Initial Assessment - 11/13/17 1122      Home Oxygen   Home Oxygen Device  None    Sleep Oxygen Prescription  None    Home Exercise Oxygen Prescription  None    Home at Rest Exercise Oxygen Prescription  None      Initial 6 min Walk   Oxygen Used  None      Program Oxygen Prescription   Program Oxygen Prescription  None      Intervention   Short Term Goals  To learn and demonstrate proper use of respiratory medications;To learn and demonstrate proper pursed lip breathing techniques or other breathing techniques.;To learn and understand importance of maintaining oxygen saturations>88%;To learn and understand importance of monitoring SPO2 with pulse oximeter and demonstrate accurate use of the pulse oximeter.    Long  Term Goals  Maintenance of O2 saturations>88%;Exhibits proper breathing techniques, such as pursed lip breathing or other method taught during program session;Compliance with respiratory medication;Demonstrates proper use of MDI's;Verbalizes importance of monitoring SPO2 with pulse oximeter and return demonstration        Oxygen Re-Evaluation:   Oxygen Discharge (Final Oxygen Re-Evaluation):   Initial Exercise Prescription: Initial Exercise Prescription - 11/13/17 1200      Date of Initial Exercise RX and Referring Provider   Date  11/13/17    Referring Provider  Alba      Recumbant Bike   Level  2    RPM  60    Watts  10    Minutes  15  METs  2.4      T5 Nustep   Level  2    SPM  80    Minutes  15    METs  2.4      Track   Laps  30    Minutes  15    METs  2.4      Prescription Details   Frequency (times per week)  3    Duration  Progress to 45 minutes of aerobic exercise without signs/symptoms of physical distress      Intensity   THRR 40-80% of Max Heartrate  105-128    Ratings of Perceived Exertion  11-15    Perceived Dyspnea  0-4      Resistance Training   Training Prescription  Yes    Weight  3 lb    Reps  10-15       Perform Capillary Blood Glucose checks as needed.  Exercise Prescription Changes:   Exercise Comments:   Exercise Goals and Review:  Exercise Goals    Row Name 11/13/17 1200             Exercise Goals   Increase Physical Activity  Yes       Intervention  Provide advice, education, support and counseling about physical activity/exercise needs.;Develop an individualized exercise prescription for aerobic and resistive training based on initial evaluation findings, risk stratification, comorbidities and participant's personal goals.       Expected Outcomes  Short Term: Attend rehab on a regular basis to increase amount of physical activity.;Long Term: Add in home exercise to make exercise part of routine and to increase amount of physical activity.;Long Term: Exercising regularly at least 3-5 days a week.       Increase Strength and Stamina  Yes       Intervention  Provide advice, education, support and counseling about physical activity/exercise needs.;Develop an individualized exercise prescription for aerobic and resistive training based  on initial evaluation findings, risk stratification, comorbidities and participant's personal goals.       Expected Outcomes  Short Term: Increase workloads from initial exercise prescription for resistance, speed, and METs.;Short Term: Perform resistance training exercises routinely during rehab and add in resistance training at home;Long Term: Improve cardiorespiratory fitness, muscular endurance and strength as measured by increased METs and functional capacity (6MWT)       Able to understand and use rate of perceived exertion (RPE) scale  Yes       Intervention  Provide education and explanation on how to use RPE scale       Expected Outcomes  Short Term: Able to use RPE daily in rehab to express subjective intensity level;Long Term:  Able to use RPE to guide intensity level when exercising independently       Able to understand and use Dyspnea scale  Yes       Intervention  Provide education and explanation on how to use Dyspnea scale       Expected Outcomes  Short Term: Able to use Dyspnea scale daily in rehab to express subjective sense of shortness of breath during exertion;Long Term: Able to use Dyspnea scale to guide intensity level when exercising independently       Knowledge and understanding of Target Heart Rate Range (THRR)  Yes       Intervention  Provide education and explanation of THRR including how the numbers were predicted and where they are located for reference       Expected Outcomes  Short  Term: Able to state/look up THRR;Short Term: Able to use daily as guideline for intensity in rehab;Long Term: Able to use THRR to govern intensity when exercising independently       Able to check pulse independently  Yes       Intervention  Provide education and demonstration on how to check pulse in carotid and radial arteries.;Review the importance of being able to check your own pulse for safety during independent exercise       Expected Outcomes  Short Term: Able to explain why pulse  checking is important during independent exercise;Long Term: Able to check pulse independently and accurately       Understanding of Exercise Prescription  Yes       Intervention  Provide education, explanation, and written materials on patient's individual exercise prescription       Expected Outcomes  Short Term: Able to explain program exercise prescription;Long Term: Able to explain home exercise prescription to exercise independently          Exercise Goals Re-Evaluation :   Discharge Exercise Prescription (Final Exercise Prescription Changes):   Nutrition:  Target Goals: Understanding of nutrition guidelines, daily intake of sodium <154m, cholesterol <2088m calories 30% from fat and 7% or less from saturated fats, daily to have 5 or more servings of fruits and vegetables.  Biometrics: Pre Biometrics - 11/13/17 1157      Pre Biometrics   Height  5' 10.5" (1.791 m)    Weight  197 lb 11.2 oz (89.7 kg)    Waist Circumference  42.5 inches    Hip Circumference  43 inches    Waist to Hip Ratio  0.99 %    BMI (Calculated)  27.96        Nutrition Therapy Plan and Nutrition Goals: Nutrition Therapy & Goals - 11/13/17 1118      Personal Nutrition Goals   Nutrition Goal  wants to maintain weight    Comments  He would like to stay below 200 pounds      Intervention Plan   Intervention  Nutrition handout(s) given to patient.;Prescribe, educate and counsel regarding individualized specific dietary modifications aiming towards targeted core components such as weight, hypertension, lipid management, diabetes, heart failure and other comorbidities.    Expected Outcomes  Short Term Goal: Understand basic principles of dietary content, such as calories, fat, sodium, cholesterol and nutrients.;Long Term Goal: Adherence to prescribed nutrition plan.       Nutrition Assessments: Nutrition Assessments - 11/13/17 1100      MEDFICTS Scores   Pre Score  33       Nutrition Goals  Re-Evaluation:   Nutrition Goals Discharge (Final Nutrition Goals Re-Evaluation):   Psychosocial: Target Goals: Acknowledge presence or absence of significant depression and/or stress, maximize coping skills, provide positive support system. Participant is able to verbalize types and ability to use techniques and skills needed for reducing stress and depression.   Initial Review & Psychosocial Screening: Initial Psych Review & Screening - 11/13/17 1117      Initial Review   Current issues with  None Identified      Family Dynamics   Good Support System?  Yes    Comments  Wife and Kids are good support      Screening Interventions   Interventions  Encouraged to exercise;Program counselor consult;Provide feedback about the scores to participant;To provide support and resources with identified psychosocial needs    Expected Outcomes  Short Term goal: Utilizing psychosocial counselor, staff and physician  to assist with identification of specific Stressors or current issues interfering with healing process. Setting desired goal for each stressor or current issue identified.;Long Term Goal: Stressors or current issues are controlled or eliminated.;Short Term goal: Identification and review with participant of any Quality of Life or Depression concerns found by scoring the questionnaire.;Long Term goal: The participant improves quality of Life and PHQ9 Scores as seen by post scores and/or verbalization of changes       Quality of Life Scores:  Scores of 19 and below usually indicate a poorer quality of life in these areas.  A difference of  2-3 points is a clinically meaningful difference.  A difference of 2-3 points in the total score of the Quality of Life Index has been associated with significant improvement in overall quality of life, self-image, physical symptoms, and general health in studies assessing change in quality of life.  PHQ-9: Recent Review Flowsheet Data    Depression screen  [facility] 2/9 11/13/2017 04/10/2016 12/28/2015 11/23/2015   Decreased Interest 0 0 0 0   Down, Depressed, Hopeless 0 0 0 0   PHQ - 2 Score 0 0 0 0   Altered sleeping 0 0 - 0   Tired, decreased energy 0 0 - 0   Change in appetite 0 0 - 0   Feeling bad or failure about yourself  0 0 - 0   Trouble concentrating 0 0 - 0   Moving slowly or fidgety/restless 0 0 - 0   Suicidal thoughts 0 0 - 0   PHQ-9 Score 0 0 - 0   Difficult doing work/chores Not difficult at all - - -     Interpretation of Total Score  Total Score Depression Severity:  1-4 = Minimal depression, 5-9 = Mild depression, 10-14 = Moderate depression, 15-19 = Moderately severe depression, 20-27 = Severe depression   Psychosocial Evaluation and Intervention:   Psychosocial Re-Evaluation:   Psychosocial Discharge (Final Psychosocial Re-Evaluation):   Education: Education Goals: Education classes will be provided on a weekly basis, covering required topics. Participant will state understanding/return demonstration of topics presented.  Learning Barriers/Preferences: Learning Barriers/Preferences - 11/13/17 1104      Learning Barriers/Preferences   Learning Barriers  None    Learning Preferences  Group Instruction;Individual Instruction;Pictoral;Skilled Demonstration;Verbal Instruction;Video;Written Material       Education Topics:  Initial Evaluation Education: - Verbal, written and demonstration of respiratory meds, oximetry and breathing techniques. Instruction on use of nebulizers and MDIs and importance of monitoring MDI activations.   Pulmonary Rehab from 11/13/2017 in [facility] Cardiac and Pulmonary Rehab  Date  11/13/17  Educator  [facility]  Instruction Review Code  1- Verbalizes Understanding      General Nutrition Guidelines/Fats and Fiber: -Group instruction provided by verbal, written material, models and posters to present the general guidelines for heart healthy nutrition. Gives an explanation and review of dietary fats  and fiber.   Pulmonary Rehab from 04/19/2016 in [facility] Cardiac and Pulmonary Rehab  Date  04/01/16  Educator  CR  Instruction Review Code (retired)  2- meets goals/outcomes      Controlling Sodium/Reading Food Labels: -Group verbal and written material supporting the discussion of sodium use in heart healthy nutrition. Review and explanation with models, verbal and written materials for utilization of the food label.   Pulmonary Rehab from 04/19/2016 in [facility] Cardiac and Pulmonary Rehab  Date  04/03/16  Educator  CR  Instruction Review Code (retired)  2- meets goals/outcomes  Exercise Physiology & General Exercise Guidelines: - Group verbal and written instruction with models to review the exercise physiology of the cardiovascular system and associated critical values. Provides general exercise guidelines with specific guidelines to those with heart or lung disease.    Pulmonary Rehab from 04/19/2016 in [facility] Cardiac and Pulmonary Rehab  Date  01/26/16  Educator  AS  Instruction Review Code (retired)  2- meets goals/outcomes      Aerobic Exercise & Resistance Training: - Gives group verbal and written instruction on the various components of exercise. Focuses on aerobic and resistive training programs and the benefits of this training and how to safely progress through these programs.   Pulmonary Rehab from 04/19/2016 in [facility] Cardiac and Pulmonary Rehab  Date  12/01/15  Educator  Nada Maclachlan  Instruction Review Code (retired)  2- Statistician, Balance, Mind/Body Relaxation: Provides group verbal/written instruction on the benefits of flexibility and balance training, including mind/body exercise modes such as yoga, pilates and tai chi.  Demonstration and skill practice provided.   Stress and Anxiety: - Provides group verbal and written instruction about the health risks of elevated stress and causes of high stress.  Discuss the correlation between heart/lung  disease and anxiety and treatment options. Review healthy ways to manage with stress and anxiety.   Depression: - Provides group verbal and written instruction on the correlation between heart/lung disease and depressed mood, treatment options, and the stigmas associated with seeking treatment.   Exercise & Equipment Safety: - Individual verbal instruction and demonstration of equipment use and safety with use of the equipment.   Pulmonary Rehab from 11/13/2017 in [facility] Cardiac and Pulmonary Rehab  Date  11/13/17  Educator  [facility]  Instruction Review Code  1- Verbalizes Understanding      Infection Prevention: - Provides verbal and written material to individual with discussion of infection control including proper hand washing and proper equipment cleaning during exercise session.   Pulmonary Rehab from 11/13/2017 in [facility] Cardiac and Pulmonary Rehab  Date  11/13/17  Educator  [facility]  Instruction Review Code  1- Verbalizes Understanding      Falls Prevention: - Provides verbal and written material to individual with discussion of falls prevention and safety.   Pulmonary Rehab from 11/13/2017 in [facility] Cardiac and Pulmonary Rehab  Date  11/13/17  Educator  [facility]  Instruction Review Code  1- Verbalizes Understanding      Diabetes: - Individual verbal and written instruction to review signs/symptoms of diabetes, desired ranges of glucose level fasting, after meals and with exercise. Advice that pre and post exercise glucose checks will be done for 3 sessions at entry of program.   Pulmonary Rehab from 04/19/2016 in [facility] Cardiac and Pulmonary Rehab  Date  11/29/15  Educator  AS  Instruction Review Code (retired)  2- meets goals/outcomes      Chronic Lung Diseases: - Group verbal and written instruction to review updates, respiratory medications, advancements in procedures and treatments. Discuss use of supplemental oxygen including available portable oxygen systems, continuous and intermittent flow  rates, concentrators, personal use and safety guidelines. Review proper use of inhaler and spacers. Provide informative websites for self-education.    Pulmonary Rehab from 04/19/2016 in [facility] Cardiac and Pulmonary Rehab  Date  12/27/15  Educator  LB  Instruction Review Code (retired)  2- meets Chief Financial Officer: - Provide group verbal and written instruction for methods to conserve energy, plan and  organize activities. Instruct on pacing techniques, use of adaptive equipment and posture/positioning to relieve shortness of breath.   Triggers and Exacerbations: - Group verbal and written instruction to review types of environmental triggers and ways to prevent exacerbations. Discuss weather changes, air quality and the benefits of nasal washing. Review warning signs and symptoms to help prevent infections. Discuss techniques for effective airway clearance, coughing, and vibrations.   AED/CPR: - Group verbal and written instruction with the use of models to demonstrate the basic use of the AED with the basic ABC's of resuscitation.   Pulmonary Rehab from 04/19/2016 in [facility] Cardiac and Pulmonary Rehab  Date  01/21/16  Educator  CE  Instruction Review Code (retired)  2- Lawyer and Physiology of the Lungs: - Group verbal and written instruction with the use of models to provide basic lung anatomy and physiology related to function, structure and complications of lung disease.   Anatomy & Physiology of the Heart: - Group verbal and written instruction and models provide basic cardiac anatomy and physiology, with the coronary electrical and arterial systems. Review of Valvular disease and Heart Failure   Cardiac Medications: - Group verbal and written instruction to review commonly prescribed medications for heart disease. Reviews the medication, class of the drug, and side effects.   Pulmonary Rehab from 04/19/2016 in [facility] Cardiac and Pulmonary  Rehab  Date  12/24/15  Educator  CE  Instruction Review Code (retired)  2- meets goals/outcomes      Know Your Numbers and Risk Factors: -Group verbal and written instruction about important numbers in your health.  Discussion of what are risk factors and how they play a role in the disease process.  Review of Cholesterol, Blood Pressure, Diabetes, and BMI and the role they play in your overall health.   Sleep Hygiene: -Provides group verbal and written instruction about how sleep can affect your health.  Define sleep hygiene, discuss sleep cycles and impact of sleep habits. Review good sleep hygiene tips.    Other: -Provides group and verbal instruction on various topics (see comments)    Knowledge Questionnaire Score: Knowledge Questionnaire Score - 11/13/17 1113      Knowledge Questionnaire Score   Pre Score  15/18   Reviewed with patient       Core Components/Risk Factors/Patient Goals at Admission: Personal Goals and Risk Factors at Admission - 11/13/17 1119      Core Components/Risk Factors/Patient Goals on Admission    Weight Management  Yes;Weight Maintenance    Intervention  Weight Management/Obesity: Establish reasonable short term and long term weight goals.;Weight Management: Provide education and appropriate resources to help participant work on and attain dietary goals.;Weight Management: Develop a combined nutrition and exercise program designed to reach desired caloric intake, while maintaining appropriate intake of nutrient and fiber, sodium and fats, and appropriate energy expenditure required for the weight goal.    Admit Weight  197 lb 11.2 oz (89.7 kg)    Goal Weight: Short Term  195 lb (88.5 kg)    Goal Weight: Long Term  195 lb (88.5 kg)    Expected Outcomes  Short Term: Continue to assess and modify interventions until short term weight is achieved;Long Term: Adherence to nutrition and physical activity/exercise program aimed toward attainment of  established weight goal;Weight Maintenance: Understanding of the daily nutrition guidelines, which includes 25-35% calories from fat, 7% or less cal from saturated fats, less than 263m cholesterol, less than 1.5gm of sodium, &  5 or more servings of fruits and vegetables daily;Understanding recommendations for meals to include 15-35% energy as protein, 25-35% energy from fat, 35-60% energy from carbohydrates, less than 228m of dietary cholesterol, 20-35 gm of total fiber daily;Understanding of distribution of calorie intake throughout the day with the consumption of 4-5 meals/snacks    Improve shortness of breath with ADL's  Yes    Intervention  Provide education, individualized exercise plan and daily activity instruction to help decrease symptoms of SOB with activities of daily living.    Expected Outcomes  Short Term: Improve cardiorespiratory fitness to achieve a reduction of symptoms when performing ADLs;Long Term: Be able to perform more ADLs without symptoms or delay the onset of symptoms    Diabetes  Yes    Intervention  Provide education about signs/symptoms and action to take for hypo/hyperglycemia.;Provide education about proper nutrition, including hydration, and aerobic/resistive exercise prescription along with prescribed medications to achieve blood glucose in normal ranges: Fasting glucose 65-99 mg/dL    Expected Outcomes  Short Term: Participant verbalizes understanding of the signs/symptoms and immediate care of hyper/hypoglycemia, proper foot care and importance of medication, aerobic/resistive exercise and nutrition plan for blood glucose control.;Long Term: Attainment of HbA1C < 7%.    Hypertension  Yes    Intervention  Provide education on lifestyle modifcations including regular physical activity/exercise, weight management, moderate sodium restriction and increased consumption of fresh fruit, vegetables, and low fat dairy, alcohol moderation, and smoking cessation.;Monitor  prescription use compliance.    Expected Outcomes  Short Term: Continued assessment and intervention until BP is < 140/973mHG in hypertensive participants. < 130/8035mG in hypertensive participants with diabetes, heart failure or chronic kidney disease.;Long Term: Maintenance of blood pressure at goal levels.    Lipids  Yes    Intervention  Provide education and support for participant on nutrition & aerobic/resistive exercise along with prescribed medications to achieve LDL <76m74mDL >40mg61m Expected Outcomes  Short Term: Participant states understanding of desired cholesterol values and is compliant with medications prescribed. Participant is following exercise prescription and nutrition guidelines.;Long Term: Cholesterol controlled with medications as prescribed, with individualized exercise RX and with personalized nutrition plan. Value goals: LDL < 76mg,2m > 40 mg.       Core Components/Risk Factors/Patient Goals Review:    Core Components/Risk Factors/Patient Goals at Discharge (Final Review):    ITP Comments: ITP Comments    Row Name 11/13/17 1042           ITP Comments  Medical Evaluation completed. Chart sent for review and changes to Dr. Mark MEmily Filberttor of [facility]          Comments: Initial ITP

## 2017-11-13 NOTE — Patient Instructions (Signed)
Patient Instructions  Patient Details  Name: Jeremiah Gibson MRN: [id number] Date of Birth: December 26, 1935 Referring Provider:  Oretha Milch, MD  Below are your personal goals for exercise, nutrition, and risk factors. Our goal is to help you stay on track towards obtaining and maintaining these goals. We will be discussing your progress on these goals with you throughout the program.  Initial Exercise Prescription: Initial Exercise Prescription - 11/13/17 1200      Date of Initial Exercise RX and Referring Provider   Date  11/13/17    Referring Provider  Alba      Recumbant Bike   Level  2    RPM  60    Watts  10    Minutes  15    METs  2.4      T5 Nustep   Level  2    SPM  80    Minutes  15    METs  2.4      Track   Laps  30    Minutes  15    METs  2.4      Prescription Details   Frequency (times per week)  3    Duration  Progress to 45 minutes of aerobic exercise without signs/symptoms of physical distress      Intensity   THRR 40-80% of Max Heartrate  105-128    Ratings of Perceived Exertion  11-15    Perceived Dyspnea  0-4      Resistance Training   Training Prescription  Yes    Weight  3 lb    Reps  10-15       Exercise Goals: Frequency: Be able to perform aerobic exercise two to three times per week in program working toward 2-5 days per week of home exercise.  Intensity: Work with a perceived exertion of 11 (fairly light) - 15 (hard) while following your exercise prescription.  We will make changes to your prescription with you as you progress through the program.   Duration: Be able to do 30 to 45 minutes of continuous aerobic exercise in addition to a 5 minute warm-up and a 5 minute cool-down routine.   Nutrition Goals: Your personal nutrition goals will be established when you do your nutrition analysis with the dietician.  The following are general nutrition guidelines to follow: Cholesterol < 200mg /day Sodium < 1500mg /day Fiber: Men over 50 yrs  - 30 grams per day  Personal Goals: Personal Goals and Risk Factors at Admission - 11/13/17 1119      Core Components/Risk Factors/Patient Goals on Admission    Weight Management  Yes;Weight Maintenance    Intervention  Weight Management/Obesity: Establish reasonable short term and long term weight goals.;Weight Management: Provide education and appropriate resources to help participant work on and attain dietary goals.;Weight Management: Develop a combined nutrition and exercise program designed to reach desired caloric intake, while maintaining appropriate intake of nutrient and fiber, sodium and fats, and appropriate energy expenditure required for the weight goal.    Expected Outcomes  Short Term: Continue to assess and modify interventions until short term weight is achieved;Long Term: Adherence to nutrition and physical activity/exercise program aimed toward attainment of established weight goal;Weight Maintenance: Understanding of the daily nutrition guidelines, which includes 25-35% calories from fat, 7% or less cal from saturated fats, less than 200mg  cholesterol, less than 1.5gm of sodium, & 5 or more servings of fruits and vegetables daily;Understanding recommendations for meals to include 15-35% energy as protein, 25-35% energy from  fat, 35-60% energy from carbohydrates, less than 200mg  of dietary cholesterol, 20-35 gm of total fiber daily;Understanding of distribution of calorie intake throughout the day with the consumption of 4-5 meals/snacks    Improve shortness of breath with ADL's  Yes    Intervention  Provide education, individualized exercise plan and daily activity instruction to help decrease symptoms of SOB with activities of daily living.    Expected Outcomes  Short Term: Improve cardiorespiratory fitness to achieve a reduction of symptoms when performing ADLs;Long Term: Be able to perform more ADLs without symptoms or delay the onset of symptoms    Diabetes  Yes    Intervention   Provide education about signs/symptoms and action to take for hypo/hyperglycemia.;Provide education about proper nutrition, including hydration, and aerobic/resistive exercise prescription along with prescribed medications to achieve blood glucose in normal ranges: Fasting glucose 65-99 mg/dL    Expected Outcomes  Short Term: Participant verbalizes understanding of the signs/symptoms and immediate care of hyper/hypoglycemia, proper foot care and importance of medication, aerobic/resistive exercise and nutrition plan for blood glucose control.;Long Term: Attainment of HbA1C < 7%.    Hypertension  Yes    Intervention  Provide education on lifestyle modifcations including regular physical activity/exercise, weight management, moderate sodium restriction and increased consumption of fresh fruit, vegetables, and low fat dairy, alcohol moderation, and smoking cessation.;Monitor prescription use compliance.    Expected Outcomes  Short Term: Continued assessment and intervention until BP is < 140/91mm HG in hypertensive participants. < 130/81mm HG in hypertensive participants with diabetes, heart failure or chronic kidney disease.;Long Term: Maintenance of blood pressure at goal levels.    Lipids  Yes    Intervention  Provide education and support for participant on nutrition & aerobic/resistive exercise along with prescribed medications to achieve LDL 70mg , HDL >40mg .    Expected Outcomes  Short Term: Participant states understanding of desired cholesterol values and is compliant with medications prescribed. Participant is following exercise prescription and nutrition guidelines.;Long Term: Cholesterol controlled with medications as prescribed, with individualized exercise RX and with personalized nutrition plan. Value goals: LDL < 70mg , HDL > 40 mg.       Tobacco Use Initial Evaluation: Social History   Tobacco Use  Smoking Status Former Smoker  . Packs/day: 2.00  . Years: 20.00  . Pack years: 40.00  .  Types: Cigarettes, Pipe  . Last attempt to quit: 11/22/1999  . Years since quitting: 17.9  Smokeless Tobacco Never Used  Tobacco Comment   quit cigs in 72 and pipe in 01    Exercise Goals and Review: Exercise Goals    Row Name 11/13/17 1200             Exercise Goals   Increase Physical Activity  Yes       Intervention  Provide advice, education, support and counseling about physical activity/exercise needs.;Develop an individualized exercise prescription for aerobic and resistive training based on initial evaluation findings, risk stratification, comorbidities and participant's personal goals.       Expected Outcomes  Short Term: Attend rehab on a regular basis to increase amount of physical activity.;Long Term: Add in home exercise to make exercise part of routine and to increase amount of physical activity.;Long Term: Exercising regularly at least 3-5 days a week.       Increase Strength and Stamina  Yes       Intervention  Provide advice, education, support and counseling about physical activity/exercise needs.;Develop an individualized exercise prescription for aerobic and resistive training based  on initial evaluation findings, risk stratification, comorbidities and participant's personal goals.       Expected Outcomes  Short Term: Increase workloads from initial exercise prescription for resistance, speed, and METs.;Short Term: Perform resistance training exercises routinely during rehab and add in resistance training at home;Long Term: Improve cardiorespiratory fitness, muscular endurance and strength as measured by increased METs and functional capacity ( )       Able to understand and use rate of perceived exertion (RPE) scale  Yes       Intervention  Provide education and explanation on how to use RPE scale       Expected Outcomes  Short Term: Able to use RPE daily in rehab to express subjective intensity level;Long Term:  Able to use RPE to guide intensity level when exercising  independently       Able to understand and use Dyspnea scale  Yes       Intervention  Provide education and explanation on how to use Dyspnea scale       Expected Outcomes  Short Term: Able to use Dyspnea scale daily in rehab to express subjective sense of shortness of breath during exertion;Long Term: Able to use Dyspnea scale to guide intensity level when exercising independently       Knowledge and understanding of Target Heart Rate Range (THRR)  Yes       Intervention  Provide education and explanation of THRR including how the numbers were predicted and where they are located for reference       Expected Outcomes  Short Term: Able to state/look up THRR;Short Term: Able to use daily as guideline for intensity in rehab;Long Term: Able to use THRR to govern intensity when exercising independently       Able to check pulse independently  Yes       Intervention  Provide education and demonstration on how to check pulse in carotid and radial arteries.;Review the importance of being able to check your own pulse for safety during independent exercise       Expected Outcomes  Short Term: Able to explain why pulse checking is important during independent exercise;Long Term: Able to check pulse independently and accurately       Understanding of Exercise Prescription  Yes       Intervention  Provide education, explanation, and written materials on patient's individual exercise prescription       Expected Outcomes  Short Term: Able to explain program exercise prescription;Long Term: Able to explain home exercise prescription to exercise independently          Copy of goals given to participant.

## 2017-11-13 NOTE — Progress Notes (Signed)
Daily Session Note  Patient Details  Name: Jeremiah Gibson MRN: [id number] Date of Birth: 03-Jan-1936 Referring Provider:     Pulmonary Rehab from 11/13/2017 in [facility] Cardiac and Pulmonary Rehab  Referring Provider  Alba      Encounter Date: 11/13/2017  Check In: Session Check In - 11/13/17 1041      Check-In   Supervising physician immediately available to respond to emergencies  [facility] immediately available ER MD    Physician(s)  Dr. Jimmye Norman and Corky Downs    Location  [facility]-Cardiac & Pulmonary Rehab    Staff Present  Justin Mend RCP,RRT,BSRT;Amanda Oletta Darter, [place], ACSM CEP, Exercise Physiologist    Medication changes reported      No    Fall or balance concerns reported     No    Warm-up and Cool-down  Not performed (comment)   Medical Evaluation   Resistance Training Performed  No    VAD Patient?  No      Pain Assessment   Currently in Pain?  No/denies          Social History   Tobacco Use  Smoking Status Former Smoker  . Packs/day: 2.00  . Years: 20.00  . Pack years: 40.00  . Types: Cigarettes, Pipe  . Last attempt to quit: 11/22/1999  . Years since quitting: 17.9  Smokeless Tobacco Never Used  Tobacco Comment   quit cigs in 72 and pipe in 01    Goals Met:  Exercise tolerated well Personal goals reviewed No report of cardiac concerns or symptoms Strength training completed today  Goals Unmet:  Not Applicable  Comments:  6 Minute Walk    Row Name 11/13/17 1201         6 Minute Walk   Phase  Initial     Distance  1300 feet     Walk Time  6 minutes     # of Rest Breaks  0     MPH  2.46     METS  2.8     RPE  11     Perceived Dyspnea   1     VO2 Peak  9.8     Symptoms  No     Resting HR  82 bpm     Resting BP  124/64     Resting Oxygen Saturation   96 %     Exercise Oxygen Saturation  during 6 min walk  94 %     Max Ex. HR  108 bpm     Max Ex. BP  174/84     2 Minute Post BP  144/64       Interval HR   1 Minute HR  87     2 Minute HR  96     3 Minute HR  96     4 Minute HR  104     5 Minute HR  108     6 Minute HR  103     2 Minute Post HR  90     Interval Heart Rate?  Yes       Interval Oxygen   Interval Oxygen?  Yes     Baseline Oxygen Saturation %  96 %     1 Minute Oxygen Saturation %  94 %     1 Minute Liters of Oxygen  0 L     2 Minute Oxygen Saturation %  94 %     2 Minute Liters of Oxygen  0 L  3 Minute Oxygen Saturation %  94 %     3 Minute Liters of Oxygen  0 L     4 Minute Oxygen Saturation %  95 %     4 Minute Liters of Oxygen  0 L     5 Minute Oxygen Saturation %  94 %     5 Minute Liters of Oxygen  0 L     6 Minute Oxygen Saturation %  94 %     6 Minute Liters of Oxygen  0 L     2 Minute Post Oxygen Saturation %  97 %     2 Minute Post Liters of Oxygen  0 L      Service Time [id number]   Dr. Emily Filbert is Medical Director for [place] and [facility].

## 2017-11-16 ENCOUNTER — Other Ambulatory Visit: Payer: Self-pay | Admitting: Family Medicine

## 2017-11-16 DIAGNOSIS — I10 Essential (primary) hypertension: Secondary | ICD-10-CM

## 2017-11-19 ENCOUNTER — Encounter: Payer: Medicare Other | Admitting: *Deleted

## 2017-11-19 DIAGNOSIS — J8410 Pulmonary fibrosis, unspecified: Secondary | ICD-10-CM

## 2017-11-19 DIAGNOSIS — J849 Interstitial pulmonary disease, unspecified: Secondary | ICD-10-CM | POA: Diagnosis not present

## 2017-11-19 DIAGNOSIS — J84112 Idiopathic pulmonary fibrosis: Secondary | ICD-10-CM | POA: Diagnosis not present

## 2017-11-19 LAB — GLUCOSE, CAPILLARY
GLUCOSE-CAPILLARY: 188 mg/dL — AB (ref 70–99)
Glucose-Capillary: 189 mg/dL — ABNORMAL HIGH (ref 70–99)

## 2017-11-19 NOTE — Progress Notes (Signed)
Daily Session Note  Patient Details  Name: Jeremiah Gibson MRN: [id number] Date of Birth: [birth year] Referring Provider:     Pulmonary Rehab from 11/13/2017 in [facility] Cardiac and Pulmonary Rehab  Referring Provider  Alba      Encounter Date: 11/19/2017  Check In: Session Check In - 11/19/17 1014      Check-In   Supervising physician immediately available to respond to emergencies  [facility] immediately available ER MD    Staff Present  Earlean Shawl, BS, ACSM CEP, Exercise Physiologist;Amanda Oletta Darter, BA, ACSM CEP, Exercise Physiologist;Joseph Tessie Fass RCP,RRT,BSRT    Medication changes reported      No    Fall or balance concerns reported     No    Tobacco Cessation  No Change    Warm-up and Cool-down  Performed as group-led instruction    Resistance Training Performed  Yes    VAD Patient?  No    PAD/SET Patient?  No      Pain Assessment   Currently in Pain?  No/denies    Multiple Pain Sites  No          Social History   Tobacco Use  Smoking Status Former Smoker  . Packs/day: 2.00  . Years: 20.00  . Pack years: 40.00  . Types: Cigarettes, Pipe  . Last attempt to quit: 11/22/1999  . Years since quitting: 18.0  Smokeless Tobacco Never Used  Tobacco Comment   quit cigs in 72 and pipe in 01    Goals Met:  Proper associated with RPD/PD & O2 Sat Exercise tolerated well Personal goals reviewed No report of cardiac concerns or symptoms Strength training completed today  Goals Unmet:  Not Applicable  Comments: First full day of exercise!  Patient was oriented to gym and equipment including functions, settings, policies, and procedures.  Patient's individual exercise prescription and treatment plan were reviewed.  All starting workloads were established based on the results of the 6 minute walk test done at initial orientation visit.  The plan for exercise progression was also introduced and progression will be customized based on patient's performance and goals.    Dr. Emily Filbert is Medical Director for [place] and [facility].

## 2017-11-20 DIAGNOSIS — Z1211 Encounter for screening for malignant neoplasm of colon: Secondary | ICD-10-CM | POA: Diagnosis not present

## 2017-11-20 DIAGNOSIS — K529 Noninfective gastroenteritis and colitis, unspecified: Secondary | ICD-10-CM | POA: Diagnosis not present

## 2017-11-20 DIAGNOSIS — K219 Gastro-esophageal reflux disease without esophagitis: Secondary | ICD-10-CM | POA: Diagnosis not present

## 2017-11-21 ENCOUNTER — Encounter: Payer: Medicare Other | Admitting: *Deleted

## 2017-11-21 DIAGNOSIS — J8410 Pulmonary fibrosis, unspecified: Secondary | ICD-10-CM

## 2017-11-21 DIAGNOSIS — J849 Interstitial pulmonary disease, unspecified: Secondary | ICD-10-CM | POA: Diagnosis not present

## 2017-11-21 DIAGNOSIS — J84112 Idiopathic pulmonary fibrosis: Secondary | ICD-10-CM | POA: Diagnosis not present

## 2017-11-21 LAB — GLUCOSE, CAPILLARY
GLUCOSE-CAPILLARY: 125 mg/dL — AB (ref 70–99)
Glucose-Capillary: 175 mg/dL — ABNORMAL HIGH (ref 70–99)

## 2017-11-21 NOTE — Progress Notes (Signed)
Daily Session Note  Patient Details  Name: Jeremiah Gibson MRN: [id number] Date of Birth: [birth year] Referring Provider:     Pulmonary Rehab from 11/13/2017 in [facility] Cardiac and Pulmonary Rehab  Referring Provider  Alba      Encounter Date: 11/21/2017  Check In: Session Check In - 11/21/17 1130      Check-In   Supervising physician immediately available to respond to emergencies  See telemetry face sheet for immediately available ER MD    Location  [facility]-Cardiac & Pulmonary Rehab    Staff Present  Heath Lark, RN, BSN, CCRP;Kylon Philbrook [place], MA, RCEP, CCRP, Exercise Physiologist;Joseph Tessie Fass RCP,RRT,BSRT    Medication changes reported      No    Fall or balance concerns reported     No    Warm-up and Cool-down  Performed as group-led instruction    Resistance Training Performed  Yes    VAD Patient?  No      Pain Assessment   Currently in Pain?  No/denies          Social History   Tobacco Use  Smoking Status Former Smoker  . Packs/day: 2.00  . Years: 20.00  . Pack years: 40.00  . Types: Cigarettes, Pipe  . Last attempt to quit: 11/22/1999  . Years since quitting: 18.0  Smokeless Tobacco Never Used  Tobacco Comment   quit cigs in 72 and pipe in 01    Goals Met:  Proper associated with RPD/PD & O2 Sat Independence with exercise equipment Exercise tolerated well No report of cardiac concerns or symptoms Strength training completed today  Goals Unmet:  Not Applicable  Comments: Pt able to follow exercise prescription today without complaint.  Will continue to monitor for progression.    Dr. Emily Filbert is Medical Director for [place] and [facility].

## 2017-11-23 DIAGNOSIS — J84112 Idiopathic pulmonary fibrosis: Secondary | ICD-10-CM | POA: Diagnosis not present

## 2017-11-23 DIAGNOSIS — J849 Interstitial pulmonary disease, unspecified: Secondary | ICD-10-CM

## 2017-11-23 DIAGNOSIS — J8410 Pulmonary fibrosis, unspecified: Secondary | ICD-10-CM

## 2017-11-23 LAB — GLUCOSE, CAPILLARY
GLUCOSE-CAPILLARY: 213 mg/dL — AB (ref 70–99)
GLUCOSE-CAPILLARY: 147 mg/dL — AB (ref 70–99)

## 2017-11-23 NOTE — Progress Notes (Signed)
Daily Session Note  Patient Details  Name: Jeremiah Gibson MRN: [id number] Date of Birth: 03/27/1935 Referring Provider:     Pulmonary Rehab from 11/13/2017 in [facility] Cardiac and Pulmonary Rehab  Referring Provider  Alba      Encounter Date: 11/23/2017  Check In: Session Check In - 11/23/17 1017      Check-In   Supervising physician immediately available to respond to emergencies  [facility] immediately available ER MD    Physician(s)  Dr. Kerman Passey and [facility]    Location  [facility]-Cardiac & Pulmonary Rehab    Staff Present  Renita Papa, RN BSN;Savreen Gebhardt [facility], [place], ACSM CEP, Exercise Physiologist    Medication changes reported      No    Fall or balance concerns reported     No    Tobacco Cessation  No Change    Warm-up and Cool-down  Performed as group-led instruction    Resistance Training Performed  Yes    VAD Patient?  No    PAD/SET Patient?  No      Pain Assessment   Currently in Pain?  No/denies          Social History   Tobacco Use  Smoking Status Former Smoker  . Packs/day: 2.00  . Years: 20.00  . Pack years: 40.00  . Types: Cigarettes, Pipe  . Last attempt to quit: 11/22/1999  . Years since quitting: 18.0  Smokeless Tobacco Never Used  Tobacco Comment   quit cigs in 72 and pipe in 01    Goals Met:  Proper associated with RPD/PD & O2 Sat Independence with exercise equipment Using PLB without cueing & demonstrates good technique Exercise tolerated well No report of cardiac concerns or symptoms Strength training completed today  Goals Unmet:  Not Applicable  Comments: Reviewed home exercise with pt today.  Pt plans to go to [facility] for exercise.  Reviewed THR, pulse, RPE, sign and symptoms, NTG use, and when to call 911 or MD.  Also discussed weather considerations and indoor options.  Pt voiced understanding.    Dr. Emily Filbert is Medical Director for [place] and [facility].

## 2017-11-26 ENCOUNTER — Ambulatory Visit: Payer: Medicare Other | Admitting: Family Medicine

## 2017-11-26 ENCOUNTER — Encounter: Payer: Self-pay | Admitting: Family Medicine

## 2017-11-26 VITALS — BP 120/68 | HR 79 | Temp 97.9°F | Resp 16 | Ht 71.0 in | Wt 196.0 lb

## 2017-11-26 DIAGNOSIS — E1121 Type 2 diabetes mellitus with diabetic nephropathy: Secondary | ICD-10-CM

## 2017-11-26 DIAGNOSIS — I10 Essential (primary) hypertension: Secondary | ICD-10-CM

## 2017-11-26 DIAGNOSIS — E785 Hyperlipidemia, unspecified: Secondary | ICD-10-CM | POA: Diagnosis not present

## 2017-11-26 DIAGNOSIS — Z23 Encounter for immunization: Secondary | ICD-10-CM

## 2017-11-26 LAB — POCT GLYCOSYLATED HEMOGLOBIN (HGB A1C)
ESTIMATED AVERAGE GLUCOSE: 146
Hemoglobin A1C: 6.7 % — AB (ref 4.0–5.6)

## 2017-11-26 NOTE — Progress Notes (Signed)
Patient: Jeremiah Gibson Male    DOB: [birth year]   82 y.o.   MRN: [id number] Visit Date: 11/26/2017  Today's Provider: Mila Merry, MD   Chief Complaint  Patient presents with  . Follow-up  . Diabetes  . Hypertension   Subjective:    HPI   Diabetes Mellitus Type II, Follow-up:   Lab Results  Component Value Date   HGBA1C 6.7 (A) 11/26/2017   HGBA1C 7.0 07/23/2017   HGBA1C 6.7 03/27/2017   Last seen for diabetes 4 months ago.  Management since then includes; no changes. He reports good compliance with treatment. He is not having side effects. none Current symptoms include none and have been unchanged. Home blood sugar records: fasting range: 120-125  Episodes of hypoglycemia? no   Current Insulin Regimen: n/a Most Recent Eye Exam: 10/13/17 Weight trend: stable Prior visit with dietician: no Current diet: in general, an "unhealthy" diet Current exercise: group exercises  ---------------------------------------------------------------   Hypertension, follow-up:  BP Readings from Last 3 Encounters:  11/26/17 120/68  09/26/17 (!) 144/82  07/23/17 (!) 148/72    He was last seen for hypertension 4 months ago.  BP at that visit was 148/72. Management since that visit includes; no changes.He reports good compliance with treatment. He is not having side effects. none He is exercising. He is not adherent to low salt diet.   Outside blood pressures are not checking. He is experiencing none.  Patient denies none.   Cardiovascular risk factors include diabetes mellitus.  Use of agents associated with hypertension: none.   ---------------------------------------------------------------   Lipid/Cholesterol, Follow-up:   Last seen for this1 years ago.  Management changes since that visit include continuing atorvasttain. . . Last Lipid Panel:    Component Value Date/Time   CHOL 156 01/30/2017 1042   CHOL 146 04/05/2016 0932   TRIG 110 01/30/2017 1042     HDL 45 01/30/2017 1042   HDL 43 04/05/2016 0932   CHOLHDL 3.5 01/30/2017 1042   LDLCALC 90 01/30/2017 1042      He reports good compliance wit h treatment. He is not having side effects.   Weight trend: stable   Wt Readings from Last 3 Encounters:  11/26/17 196 lb (88.9 kg)  11/13/17 197 lb 11.2 oz (89.7 kg)  09/26/17 196 lb 3.2 oz (89 kg)    -------------------------------------------------------------------  No Known Allergies   Current Outpatient Medications:  .  albuterol (VENTOLIN HFA) 108 (90 Base) MCG/ACT inhaler, Inhale 2 puffs into the lungs every 4 (four) hours as needed for wheezing or shortness of breath., Disp: 3 Inhaler, Rfl: 3 .  allopurinol (ZYLOPRIM) 100 MG tablet, TAKE 2 TABLETS BY MOUTH TWICE DAILY. GENERIC EQUIVALENT FOR ZYLOPRIM, Disp: 360 tablet, Rfl: 3 .  amLODipine (NORVASC) 2.5 MG tablet, Take 1 tablet (2.5 mg total) by mouth daily., Disp: 90 tablet, Rfl: 4 .  aspirin 81 MG tablet, Take 81 mg by mouth every evening. , Disp: , Rfl:  .  clopidogrel (PLAVIX) 75 MG tablet, TAKE 1 TABLET BY MOUTH DAILY GENERIC EQUIVALENT FOR PLAVIX, Disp: 90 tablet, Rfl: 4 .  finasteride (PROSCAR) 5 MG tablet, TAKE 1 TABLET BY MOUTH EVERY DAY. GENERIC EQUIVALENT FOR PROSCAR, Disp: 90 tablet, Rfl: 4 .  fluticasone (FLONASE) 50 MCG/ACT nasal spray, Place 2 sprays into both nostrils daily., Disp: 16 g, Rfl: 1 .  Fluticasone-Salmeterol (ADVAIR DISKUS) 250-50 MCG/DOSE AEPB, Inhale 1 puff into the lungs 2 (two) times daily., Disp: 3 each, Rfl: 5 .  glipiZIDE (GLUCOTROL XL) 5 MG 24 hr tablet, Take 1 tablet (5 mg total) by mouth daily with breakfast., Disp: 90 tablet, Rfl: 2 .  glucose blood (ONE TOUCH ULTRA TEST) test strip, Use as instructed to check blood sugar daily. Dx: E11.21, Disp: 100 each, Rfl: 4 .  losartan (COZAAR) 100 MG tablet, TAKE 1 TABLET BY MOUTH EVERY DAY FOR BLOOD PRESSURE, Disp: 90 tablet, Rfl: 3 .  Magnesium 400 MG TABS, Take 400 mg by mouth 2 (two) times  daily. , Disp: , Rfl:  .  metFORMIN (GLUCOPHAGE) 500 MG tablet, TAKE 2 TABLETS BY MOUTH TWICE DAILY WITH A MEAL, Disp: 360 tablet, Rfl: 4 .  MULTIPLE VITAMIN PO, MULTIVITAMINS (Oral Tablet)  1 Every Day for 0 days  Quantity: 0.00;  Refills: 0   Ordered :06-Jan-2010  [name] ;  Started 22-Mar-2009 Active, Disp: , Rfl:  .  Nintedanib (OFEV) 100 MG CAPS, Take 100 mg by mouth 2 (two) times daily with a meal. , Disp: , Rfl:  .  pantoprazole (PROTONIX) 20 MG tablet, Take 20 mg by mouth 2 (two) times daily., Disp: , Rfl:  .  polyethylene glycol-electrolytes (NULYTELY/GOLYTELY) 420 g solution, Take 4,000 mLs by mouth once., Disp: , Rfl:  .  potassium chloride SA (K-DUR,KLOR-CON) 20 MEQ tablet, TAKE 1 TABLET BY MOUTH DAILY, Disp: 90 tablet, Rfl: 4 .  pravastatin (PRAVACHOL) 40 MG tablet, Take 1 tablet (40 mg total) by mouth daily., Disp: 90 tablet, Rfl: 3 .  terazosin (HYTRIN) 5 MG capsule, TAKE 1 CAPSULE BY MOUTH EVERY DAY FOR PROSTATE, Disp: 90 capsule, Rfl: 4 .  triamterene-hydrochlorothiazide (MAXZIDE-25) 37.5-25 MG tablet, TAKE 1 TABLET BY MOUTH EVERY DAY FOR BLOOD PRESSURE GENERIC EQUIVALENT FOR MAXZIDE-25, Disp: 90 tablet, Rfl: 4 .  fish oil-omega-3 fatty acids 1000 MG capsule, Take 1 g by mouth 2 (two) times daily. , Disp: , Rfl:   Review of Systems  Constitutional: Negative for appetite change, chills and fever.  Respiratory: Negative for chest tightness, shortness of breath and wheezing.   Cardiovascular: Negative for chest pain and palpitations.  Gastrointestinal: Negative for abdominal pain, nausea and vomiting.    Social History   Tobacco Use  . Smoking status: Former Smoker    Packs/day: 2.00    Years: 20.00    Pack years: 40.00    Types: Cigarettes, Pipe    Last attempt to quit: 11/22/1999    Years since quitting: 18.0  . Smokeless tobacco: Never Used  . Tobacco comment: quit cigs in 70 and pipe in 01  Substance Use Topics  . Alcohol use: Yes    Comment: socially,none  last 24hrs   Objective:   BP 120/68 (BP Location: Right Arm, Patient Position: Sitting, Cuff Size: Large)   Pulse 79   Temp 97.9 F (36.6 C) (Oral)   Resp 16   Ht 5\' 11"  (1.803 m)   Wt 196 lb (88.9 kg)   SpO2 98%   BMI 27.34 kg/m  Vitals:   11/26/17 0821  BP: 120/68  Pulse: 79  Resp: 16  Temp: 97.9 F (36.6 C)  TempSrc: Oral  SpO2: 98%  Weight: 196 lb (88.9 kg)  Height: 5\' 11"  (1.803 m)     Physical Exam   General Appearance:    Alert, cooperative, no distress  Eyes:    PERRL, conjunctiva/corneas clear, EOM's intact       Lungs:     Clear to auscultation bilaterally, respirations unlabored  Heart:    Regular rate and  rhythm  Neurologic:   Awake, alert, oriented x 3. No apparent focal neurological           defect.       Results for orders placed or performed in visit on 11/26/17  POCT glycosylated hemoglobin (Hb A1C)  Result Value Ref Range   Hemoglobin A1C 6.7 (A) 4.0 - 5.6 %   HbA1c POC (<> result, manual entry)     HbA1c, POC (prediabetic range)     HbA1c, POC (controlled diabetic range)     Est. average glucose Bld gHb Est-mCnc 146        Assessment & Plan:     1. Diabetes mellitus with nephropathy (HCC)  - POCT glycosylated hemoglobin (Hb A1C) - POCT UA - Microalbumin  2. Essential hypertension Well controlled.  Continue current medications.    3. Hyperlipidemia, unspecified hyperlipidemia type He is tolerating pravastatin well with no adverse effects.   - Comprehensive metabolic panel - Lipid panel - CBC  4. Need for influenza vaccination  - Flu vaccine HIGH DOSE PF       Mila Merryonald Fisher, MD  [facility]

## 2017-11-27 ENCOUNTER — Telehealth: Payer: Self-pay

## 2017-11-27 LAB — LIPID PANEL
Chol/HDL Ratio: 3.2 ratio (ref 0.0–5.0)
Cholesterol, Total: 146 mg/dL (ref 100–199)
HDL: 45 mg/dL (ref >39)
LDL Calculated: 80 mg/dL (ref 0–99)
Triglycerides: 104 mg/dL (ref 0–149)
VLDL CHOLESTEROL CAL: 21 mg/dL (ref 5–40)

## 2017-11-27 LAB — CBC
HEMOGLOBIN: 11.6 g/dL — AB (ref 13.0–17.7)
Hematocrit: 34.6 % — ABNORMAL LOW (ref 37.5–51.0)
MCH: 29.7 pg (ref 26.6–33.0)
MCHC: 33.5 g/dL (ref 31.5–35.7)
MCV: 89 fL (ref 79–97)
PLATELETS: 231 10*3/uL (ref 150–450)
RBC: 3.91 x10E6/uL — AB (ref 4.14–5.80)
RDW: 14.5 % (ref 12.3–15.4)
WBC: 8.7 10*3/uL (ref 3.4–10.8)

## 2017-11-27 LAB — COMPREHENSIVE METABOLIC PANEL
A/G RATIO: 1.8 (ref 1.2–2.2)
ALK PHOS: 83 IU/L (ref 39–117)
ALT: 16 IU/L (ref 0–44)
AST: 19 IU/L (ref 0–40)
Albumin: 4.2 g/dL (ref 3.5–4.7)
BILIRUBIN TOTAL: 0.3 mg/dL (ref 0.0–1.2)
BUN/Creatinine Ratio: 14 (ref 10–24)
BUN: 15 mg/dL (ref 8–27)
CO2: 28 mmol/L (ref 20–29)
Calcium: 9.5 mg/dL (ref 8.6–10.2)
Chloride: 104 mmol/L (ref 96–106)
Creatinine, Ser: 1.06 mg/dL (ref 0.76–1.27)
GFR calc non Af Amer: 66 mL/min/{1.73_m2} (ref >59)
GFR, EST AFRICAN AMERICAN: 76 mL/min/{1.73_m2} (ref >59)
GLUCOSE: 155 mg/dL — AB (ref 65–99)
Globulin, Total: 2.4 g/dL (ref 1.5–4.5)
Potassium: 3.5 mmol/L (ref 3.5–5.2)
Sodium: 144 mmol/L (ref 134–144)
TOTAL PROTEIN: 6.6 g/dL (ref 6.0–8.5)

## 2017-11-27 NOTE — Telephone Encounter (Signed)
Patient advised as below.  

## 2017-11-27 NOTE — Telephone Encounter (Signed)
-----   Message from Malva Limesonald E Fisher, MD sent at 11/27/2017  7:51 AM EDT ----- Labs good. Normal kidney and liver functions. Cholesterol is well controlled at 146. Continue current medications.  Follow up for diabetes in 6 months.

## 2017-11-28 DIAGNOSIS — J8410 Pulmonary fibrosis, unspecified: Secondary | ICD-10-CM

## 2017-11-28 DIAGNOSIS — J849 Interstitial pulmonary disease, unspecified: Secondary | ICD-10-CM

## 2017-11-28 DIAGNOSIS — J84112 Idiopathic pulmonary fibrosis: Secondary | ICD-10-CM | POA: Diagnosis not present

## 2017-11-28 NOTE — Progress Notes (Signed)
Daily Session Note  Patient Details  Name: Jeremiah Gibson MRN: [id number] Date of Birth: April 08, 1935 Referring Provider:     Pulmonary Rehab from 11/13/2017 in [facility] Cardiac and Pulmonary Rehab  Referring Provider  Alba      Encounter Date: 11/28/2017  Check In: Session Check In - 11/28/17 1023      Check-In   Supervising physician immediately available to respond to emergencies  [facility] immediately available ER MD    Physician(s)  Mariea Clonts and Alfred Levins    Location  [facility]-Cardiac & Pulmonary Rehab    Staff Present  Alberteen Sam, MA, RCEP, CCRP, Exercise Physiologist;Joseph Foy [place], [place], ACSM CEP, Exercise Physiologist    Medication changes reported      No    Fall or balance concerns reported     No    Warm-up and Cool-down  Performed as group-led instruction    Resistance Training Performed  Yes    VAD Patient?  No    PAD/SET Patient?  No      Pain Assessment   Currently in Pain?  No/denies    Multiple Pain Sites  No          Social History   Tobacco Use  Smoking Status Former Smoker  . Packs/day: 2.00  . Years: 20.00  . Pack years: 40.00  . Types: Cigarettes, Pipe  . Last attempt to quit: 11/22/1999  . Years since quitting: 18.0  Smokeless Tobacco Never Used  Tobacco Comment   quit cigs in 72 and pipe in 01    Goals Met:  Proper associated with RPD/PD & O2 Sat Independence with exercise equipment Exercise tolerated well Strength training completed today  Goals Unmet:  Not Applicable  Comments: Pt able to follow exercise prescription today without complaint.  Will continue to monitor for progression.    Dr. Emily Filbert is Medical Director for [employer] and [facility].

## 2017-11-29 DIAGNOSIS — L570 Actinic keratosis: Secondary | ICD-10-CM | POA: Diagnosis not present

## 2017-11-29 DIAGNOSIS — D225 Melanocytic nevi of trunk: Secondary | ICD-10-CM | POA: Diagnosis not present

## 2017-11-29 DIAGNOSIS — L578 Other skin changes due to chronic exposure to nonionizing radiation: Secondary | ICD-10-CM | POA: Diagnosis not present

## 2017-11-29 DIAGNOSIS — C44319 Basal cell carcinoma of skin of other parts of face: Secondary | ICD-10-CM | POA: Diagnosis not present

## 2017-11-30 DIAGNOSIS — J84112 Idiopathic pulmonary fibrosis: Secondary | ICD-10-CM | POA: Diagnosis not present

## 2017-11-30 DIAGNOSIS — J849 Interstitial pulmonary disease, unspecified: Secondary | ICD-10-CM | POA: Diagnosis not present

## 2017-11-30 NOTE — Progress Notes (Signed)
Daily Session Note  Patient Details  Name: Jeremiah Gibson MRN: [id number] Date of Birth: 02-Feb-1936 Referring Provider:     Pulmonary Rehab from 11/13/2017 in [facility] Cardiac and Pulmonary Rehab  Referring Provider  Alba      Encounter Date: 11/30/2017  Check In: Session Check In - 11/30/17 1019      Check-In   Supervising physician immediately available to respond to emergencies  [facility] immediately available ER MD    Physician(s)  Dr. Archie Balboa and Quentin Cornwall    Location  [facility]-Cardiac & Pulmonary Rehab    Staff Present  Justin Mend RCP,RRT,BSRT;Amanda Oletta Darter, BA, ACSM CEP, Exercise Physiologist;Meredith Sherryll Burger, RN BSN    Medication changes reported      No    Fall or balance concerns reported     No    Warm-up and Cool-down  Performed as group-led Higher education careers adviser Performed  Yes    VAD Patient?  No      Pain Assessment   Currently in Pain?  No/denies          Social History   Tobacco Use  Smoking Status Former Smoker  . Packs/day: 2.00  . Years: 20.00  . Pack years: 40.00  . Types: Cigarettes, Pipe  . Last attempt to quit: 11/22/1999  . Years since quitting: 18.0  Smokeless Tobacco Never Used  Tobacco Comment   quit cigs in 72 and pipe in 01    Goals Met:  Independence with exercise equipment Exercise tolerated well No report of cardiac concerns or symptoms Strength training completed today  Goals Unmet:  Not Applicable  Comments: Pt able to follow exercise prescription today without complaint.  Will continue to monitor for progression.    Dr. Emily Filbert is Medical Director for [place] and [facility].

## 2017-12-03 ENCOUNTER — Encounter: Payer: Medicare Other | Admitting: *Deleted

## 2017-12-03 DIAGNOSIS — J849 Interstitial pulmonary disease, unspecified: Secondary | ICD-10-CM

## 2017-12-03 DIAGNOSIS — J84112 Idiopathic pulmonary fibrosis: Secondary | ICD-10-CM | POA: Diagnosis not present

## 2017-12-03 DIAGNOSIS — J8410 Pulmonary fibrosis, unspecified: Secondary | ICD-10-CM

## 2017-12-03 NOTE — Progress Notes (Signed)
Daily Session Note  Patient Details  Name: Rickardo Brinegar MRN: [id number] Date of Birth: 1936/01/03 Referring Provider:     Pulmonary Rehab from 11/13/2017 in [facility] Cardiac and Pulmonary Rehab  Referring Provider  Alba      Encounter Date: 12/03/2017  Check In: Session Check In - 12/03/17 1026      Check-In   Supervising physician immediately available to respond to emergencies  [facility] immediately available ER MD    Physician(s)  Dr. Jimmye Norman and Dr. Corky Downs    Location  [facility]-Cardiac & Pulmonary Rehab    Staff Present  Nada Maclachlan, BA, ACSM CEP, Exercise Physiologist;[facility], [place], [place], ACSM CEP, Exercise Physiologist    Medication changes reported      No    Fall or balance concerns reported     No    Tobacco Cessation  No Change    Warm-up and Cool-down  Performed as group-led instruction    Resistance Training Performed  Yes    VAD Patient?  No    PAD/SET Patient?  No      Pain Assessment   Currently in Pain?  No/denies    Multiple Pain Sites  No          Social History   Tobacco Use  Smoking Status Former Smoker  . Packs/day: 2.00  . Years: 20.00  . Pack years: 40.00  . Types: Cigarettes, Pipe  . Last attempt to quit: 11/22/1999  . Years since quitting: 18.0  Smokeless Tobacco Never Used  Tobacco Comment   quit cigs in 72 and pipe in 01    Goals Met:  Proper associated with RPD/PD & O2 Sat Independence with exercise equipment Exercise tolerated well No report of cardiac concerns or symptoms Strength training completed today  Goals Unmet:  Not Applicable  Comments: Pt able to follow exercise prescription today without complaint.  Will continue to monitor for progression.    Dr. Emily Filbert is Medical Director for [place] and [facility].

## 2017-12-03 NOTE — Progress Notes (Signed)
Pulmonary Individual Treatment Plan  Patient Details  Name: Naser Schuld MRN: [id number] Date of Birth: [birth year] Referring Provider:     Pulmonary Rehab from 11/13/2017 in [facility] Cardiac and Pulmonary Rehab  Referring Provider  Alba      Initial Encounter Date:    Pulmonary Rehab from 11/13/2017 in [facility] Cardiac and Pulmonary Rehab  Date  11/13/17      Visit Diagnosis: Interstitial lung disease ([place])  Patient's Home Medications on Admission:  Current Outpatient Medications:  .  albuterol (VENTOLIN HFA) 108 (90 Base) MCG/ACT inhaler, Inhale 2 puffs into the lungs every 4 (four) hours as needed for wheezing or shortness of breath., Disp: 3 Inhaler, Rfl: 3 .  allopurinol (ZYLOPRIM) 100 MG tablet, TAKE 2 TABLETS BY MOUTH TWICE DAILY. GENERIC EQUIVALENT FOR ZYLOPRIM, Disp: 360 tablet, Rfl: 3 .  amLODipine (NORVASC) 2.5 MG tablet, Take 1 tablet (2.5 mg total) by mouth daily., Disp: 90 tablet, Rfl: 4 .  aspirin 81 MG tablet, Take 81 mg by mouth every evening. , Disp: , Rfl:  .  clopidogrel (PLAVIX) 75 MG tablet, TAKE 1 TABLET BY MOUTH DAILY GENERIC EQUIVALENT FOR PLAVIX, Disp: 90 tablet, Rfl: 4 .  finasteride (PROSCAR) 5 MG tablet, TAKE 1 TABLET BY MOUTH EVERY DAY. GENERIC EQUIVALENT FOR PROSCAR, Disp: 90 tablet, Rfl: 4 .  fish oil-omega-3 fatty acids 1000 MG capsule, Take 1 g by mouth 2 (two) times daily. , Disp: , Rfl:  .  fluticasone (FLONASE) 50 MCG/ACT nasal spray, Place 2 sprays into both nostrils daily., Disp: 16 g, Rfl: 1 .  Fluticasone-Salmeterol (ADVAIR DISKUS) 250-50 MCG/DOSE AEPB, Inhale 1 puff into the lungs 2 (two) times daily., Disp: 3 each, Rfl: 5 .  glipiZIDE (GLUCOTROL XL) 5 MG 24 hr tablet, Take 1 tablet (5 mg total) by mouth daily with breakfast., Disp: 90 tablet, Rfl: 2 .  glucose blood (ONE TOUCH ULTRA TEST) test strip, Use as instructed to check blood sugar daily. Dx: E11.21, Disp: 100 each, Rfl: 4 .  losartan (COZAAR) 100 MG tablet, TAKE 1 TABLET BY MOUTH EVERY DAY FOR  BLOOD PRESSURE, Disp: 90 tablet, Rfl: 3 .  Magnesium 400 MG TABS, Take 400 mg by mouth 2 (two) times daily. , Disp: , Rfl:  .  metFORMIN (GLUCOPHAGE) 500 MG tablet, TAKE 2 TABLETS BY MOUTH TWICE DAILY WITH A MEAL, Disp: 360 tablet, Rfl: 4 .  MULTIPLE VITAMIN PO, MULTIVITAMINS (Oral Tablet)  1 Every Day for 0 days  Quantity: 0.00;  Refills: 0   Ordered :06-Jan-2010  Doy Hutching ;  Started 22-Mar-2009 Active, Disp: , Rfl:  .  Nintedanib (OFEV) 100 MG CAPS, Take 100 mg by mouth 2 (two) times daily with a meal. , Disp: , Rfl:  .  pantoprazole (PROTONIX) 20 MG tablet, Take 20 mg by mouth 2 (two) times daily., Disp: , Rfl:  .  polyethylene glycol-electrolytes (NULYTELY/GOLYTELY) 420 g solution, Take 4,000 mLs by mouth once., Disp: , Rfl:  .  potassium chloride SA (K-DUR,KLOR-CON) 20 MEQ tablet, TAKE 1 TABLET BY MOUTH DAILY, Disp: 90 tablet, Rfl: 4 .  pravastatin (PRAVACHOL) 40 MG tablet, Take 1 tablet (40 mg total) by mouth daily., Disp: 90 tablet, Rfl: 3 .  terazosin (HYTRIN) 5 MG capsule, TAKE 1 CAPSULE BY MOUTH EVERY DAY FOR PROSTATE, Disp: 90 capsule, Rfl: 4 .  triamterene-hydrochlorothiazide (MAXZIDE-25) 37.5-25 MG tablet, TAKE 1 TABLET BY MOUTH EVERY DAY FOR BLOOD PRESSURE GENERIC EQUIVALENT FOR MAXZIDE-25, Disp: 90 tablet, Rfl: 4  Past Medical History: Past Medical  History:  Diagnosis Date  . Anemia   . Arthritis   . Asthma   . Colon polyps   . Cough    CHRONIC  . Diabetes mellitus without complication ([place])   . Diverticulosis of colon (without mention of hemorrhage)   . Dyspnea    pulmonary fibrosis  . Dysrhythmia   . Esophageal reflux   . Gout   . Hyperlipidemia   . Hypertension   . Impotence of organic origin   . Leg cramps   . Pulmonary fibrosis ([place])   . Pulmonary fibrosis ([place])   . Wheezing     Tobacco Use: Social History   Tobacco Use  Smoking Status Former Smoker  . Packs/day: 2.00  . Years: 20.00  . Pack years: 40.00  . Types: Cigarettes, Pipe  . Last  attempt to quit: 11/22/1999  . Years since quitting: 18.0  Smokeless Tobacco Never Used  Tobacco Comment   quit cigs in 80 and pipe in 01    Labs: Recent Review Flowsheet Data    Labs for ITP Cardiac and Pulmonary Rehab Latest Ref Rng & Units 11/21/2016 01/30/2017 03/27/2017 07/23/2017 11/26/2017   Cholestrol 100 - 199 mg/dL - 156 - - 146   LDLCALC 0 - 99 mg/dL - 90 - - 80   HDL >39 mg/dL - 45 - - 45   Trlycerides 0 - 149 mg/dL - 110 - - 104   Hemoglobin A1c 4.0 - 5.6 % 7.3 - 6.7 7.0 6.7(A)       Pulmonary Assessment Scores: Pulmonary Assessment Scores    Row Name 11/13/17 1058         ADL UCSD   ADL Phase  Entry     SOB Score total  10     Rest  0     Walk  0     Stairs  0     Bath  0     Dress  0     Shop  0       CAT Score   CAT Score  9       mMRC Score   mMRC Score  1        Pulmonary Function Assessment: Pulmonary Function Assessment - 11/13/17 1121      Breath   Bilateral Breath Sounds  Clear;Decreased    Shortness of Breath  Yes       Exercise Target Goals: Exercise Program Goal: Individual exercise prescription set using results from initial 6 min walk test and THRR while considering  patient's activity barriers and safety.   Exercise Prescription Goal: Initial exercise prescription builds to 30-45 minutes a day of aerobic activity, 2-3 days per week.  Home exercise guidelines will be given to patient during program as part of exercise prescription that the participant will acknowledge.  Activity Barriers & Risk Stratification:   6 Minute Walk: 6 Minute Walk    Row Name 11/13/17 1201         6 Minute Walk   Phase  Initial     Distance  1300 feet     Walk Time  6 minutes     # of Rest Breaks  0     MPH  2.46     METS  2.8     RPE  11     Perceived Dyspnea   1     VO2 Peak  9.8     Symptoms  No     Resting HR  82  bpm     Resting BP  124/64     Resting Oxygen Saturation   96 %     Exercise Oxygen Saturation  during 6 min walk  94 %      Max Ex. HR  108 bpm     Max Ex. BP  174/84     2 Minute Post BP  144/64       Interval HR   1 Minute HR  87     2 Minute HR  96     3 Minute HR  96     4 Minute HR  104     5 Minute HR  108     6 Minute HR  103     2 Minute Post HR  90     Interval Heart Rate?  Yes       Interval Oxygen   Interval Oxygen?  Yes     Baseline Oxygen Saturation %  96 %     1 Minute Oxygen Saturation %  94 %     1 Minute Liters of Oxygen  0 L     2 Minute Oxygen Saturation %  94 %     2 Minute Liters of Oxygen  0 L     3 Minute Oxygen Saturation %  94 %     3 Minute Liters of Oxygen  0 L     4 Minute Oxygen Saturation %  95 %     4 Minute Liters of Oxygen  0 L     5 Minute Oxygen Saturation %  94 %     5 Minute Liters of Oxygen  0 L     6 Minute Oxygen Saturation %  94 %     6 Minute Liters of Oxygen  0 L     2 Minute Post Oxygen Saturation %  97 %     2 Minute Post Liters of Oxygen  0 L       Oxygen Initial Assessment: Oxygen Initial Assessment - 11/13/17 1122      Home Oxygen   Home Oxygen Device  None    Sleep Oxygen Prescription  None    Home Exercise Oxygen Prescription  None    Home at Rest Exercise Oxygen Prescription  None      Initial 6 min Walk   Oxygen Used  None      Program Oxygen Prescription   Program Oxygen Prescription  None      Intervention   Short Term Goals  To learn and demonstrate proper use of respiratory medications;To learn and demonstrate proper pursed lip breathing techniques or other breathing techniques.;To learn and understand importance of maintaining oxygen saturations>88%;To learn and understand importance of monitoring SPO2 with pulse oximeter and demonstrate accurate use of the pulse oximeter.    Long  Term Goals  Maintenance of O2 saturations>88%;Exhibits proper breathing techniques, such as pursed lip breathing or other method taught during program session;Compliance with respiratory medication;Demonstrates proper use of MDI's;Verbalizes  importance of monitoring SPO2 with pulse oximeter and return demonstration       Oxygen Re-Evaluation: Oxygen Re-Evaluation    Row Name 11/19/17 1015             Program Oxygen Prescription   Program Oxygen Prescription  None         Home Oxygen   Home Oxygen Device  None       Sleep Oxygen Prescription  None  Home Exercise Oxygen Prescription  None       Home at Rest Exercise Oxygen Prescription  None         Goals/Expected Outcomes   Short Term Goals  To learn and demonstrate proper use of respiratory medications;To learn and demonstrate proper pursed lip breathing techniques or other breathing techniques.;To learn and understand importance of maintaining oxygen saturations>88%;To learn and understand importance of monitoring SPO2 with pulse oximeter and demonstrate accurate use of the pulse oximeter.       Long  Term Goals  Maintenance of O2 saturations>88%;Exhibits proper breathing techniques, such as pursed lip breathing or other method taught during program session;Compliance with respiratory medication;Demonstrates proper use of MDI's;Verbalizes importance of monitoring SPO2 with pulse oximeter and return demonstration       Comments  Reviewed PLB technique with pt.  Talked about how it work and it's important to maintaining his exercise saturations.         Goals/Expected Outcomes  Short: Become more profiecient at using PLB.   Long: Become independent at using PLB.          Oxygen Discharge (Final Oxygen Re-Evaluation): Oxygen Re-Evaluation - 11/19/17 1015      Program Oxygen Prescription   Program Oxygen Prescription  None      Home Oxygen   Home Oxygen Device  None    Sleep Oxygen Prescription  None    Home Exercise Oxygen Prescription  None    Home at Rest Exercise Oxygen Prescription  None      Goals/Expected Outcomes   Short Term Goals  To learn and demonstrate proper use of respiratory medications;To learn and demonstrate proper pursed lip breathing  techniques or other breathing techniques.;To learn and understand importance of maintaining oxygen saturations>88%;To learn and understand importance of monitoring SPO2 with pulse oximeter and demonstrate accurate use of the pulse oximeter.    Long  Term Goals  Maintenance of O2 saturations>88%;Exhibits proper breathing techniques, such as pursed lip breathing or other method taught during program session;Compliance with respiratory medication;Demonstrates proper use of MDI's;Verbalizes importance of monitoring SPO2 with pulse oximeter and return demonstration    Comments  Reviewed PLB technique with pt.  Talked about how it work and it's important to maintaining his exercise saturations.      Goals/Expected Outcomes  Short: Become more profiecient at using PLB.   Long: Become independent at using PLB.       Initial Exercise Prescription: Initial Exercise Prescription - 11/13/17 1200      Date of Initial Exercise RX and Referring Provider   Date  11/13/17    Referring Provider  Alba      Recumbant Bike   Level  2    RPM  60    Watts  10    Minutes  15    METs  2.4      T5 Nustep   Level  2    SPM  80    Minutes  15    METs  2.4      Track   Laps  30    Minutes  15    METs  2.4      Prescription Details   Frequency (times per week)  3    Duration  Progress to 45 minutes of aerobic exercise without signs/symptoms of physical distress      Intensity   THRR 40-80% of Max Heartrate  105-128    Ratings of Perceived Exertion  11-15    Perceived  Dyspnea  0-4      Resistance Training   Training Prescription  Yes    Weight  3 lb    Reps  10-15       Perform Capillary Blood Glucose checks as needed.  Exercise Prescription Changes: Exercise Prescription Changes    Row Name 11/21/17 1500 11/23/17 1000           Response to Exercise   Blood Pressure (Admit)  162/72  -      Blood Pressure (Exercise)  154/74  -      Blood Pressure (Exit)  120/70  -      Heart Rate  (Admit)  86 bpm  -      Heart Rate (Exercise)  91 bpm  -      Heart Rate (Exit)  75 bpm  -      Oxygen Saturation (Admit)  96 %  -      Oxygen Saturation (Exercise)  97 %  -      Oxygen Saturation (Exit)  97 %  -      Rating of Perceived Exertion (Exercise)  12  -      Perceived Dyspnea (Exercise)  2  -      Symptoms  none  -      Comments  second day  -      Duration  Progress to 45 minutes of aerobic exercise without signs/symptoms of physical distress  -      Intensity  THRR New  -        Progression   Progression  Continue to progress workloads to maintain intensity without signs/symptoms of physical distress.  -      Average METs  2  -        Resistance Training   Training Prescription  Yes  -      Weight  3 lb  -      Reps  10-15  -        T5 Nustep   Level  3  -      SPM  80  -      Minutes  15  -      METs  1.9  -        Track   Laps  34  -      Minutes  15  -      METs  2.6  -        Home Exercise Plan   Plans to continue exercise at  [facility] (comment) [employer]      Frequency  -  Add 2 additional days to program exercise sessions.      Initial Home Exercises Provided  -  11/23/17         Exercise Comments: Exercise Comments    Row Name 11/19/17 1018 11/23/17 1043         Exercise Comments   First full day of exercise!  Patient was oriented to gym and equipment including functions, settings, policies, and procedures.  Patient's individual exercise prescription and treatment plan were reviewed.  All starting workloads were established based on the results of the 6 minute walk test done at initial orientation visit.  The plan for exercise progression was also introduced and progression will be customized based on patient's performance and goals.  Reviewed home exercise with pt today.  Pt plans to go to [facility] for exercise.  Reviewed THR, pulse, RPE, sign and symptoms, NTG  use, and when to call 911 or MD.  Also discussed weather considerations and indoor  options.  Pt voiced understanding.         Exercise Goals and Review: Exercise Goals    Row Name 11/13/17 1200             Exercise Goals   Increase Physical Activity  Yes       Intervention  Provide advice, education, support and counseling about physical activity/exercise needs.;Develop an individualized exercise prescription for aerobic and resistive training based on initial evaluation findings, risk stratification, comorbidities and participant's personal goals.       Expected Outcomes  Short Term: Attend rehab on a regular basis to increase amount of physical activity.;Long Term: Add in home exercise to make exercise part of routine and to increase amount of physical activity.;Long Term: Exercising regularly at least 3-5 days a week.       Increase Strength and Stamina  Yes       Intervention  Provide advice, education, support and counseling about physical activity/exercise needs.;Develop an individualized exercise prescription for aerobic and resistive training based on initial evaluation findings, risk stratification, comorbidities and participant's personal goals.       Expected Outcomes  Short Term: Increase workloads from initial exercise prescription for resistance, speed, and METs.;Short Term: Perform resistance training exercises routinely during rehab and add in resistance training at home;Long Term: Improve cardiorespiratory fitness, muscular endurance and strength as measured by increased METs and functional capacity (6MWT)       Able to understand and use rate of perceived exertion (RPE) scale  Yes       Intervention  Provide education and explanation on how to use RPE scale       Expected Outcomes  Short Term: Able to use RPE daily in rehab to express subjective intensity level;Long Term:  Able to use RPE to guide intensity level when exercising independently       Able to understand and use Dyspnea scale  Yes       Intervention  Provide education and explanation on how to  use Dyspnea scale       Expected Outcomes  Short Term: Able to use Dyspnea scale daily in rehab to express subjective sense of shortness of breath during exertion;Long Term: Able to use Dyspnea scale to guide intensity level when exercising independently       Knowledge and understanding of Target Heart Rate Range (THRR)  Yes       Intervention  Provide education and explanation of THRR including how the numbers were predicted and where they are located for reference       Expected Outcomes  Short Term: Able to state/look up THRR;Short Term: Able to use daily as guideline for intensity in rehab;Long Term: Able to use THRR to govern intensity when exercising independently       Able to check pulse independently  Yes       Intervention  Provide education and demonstration on how to check pulse in carotid and radial arteries.;Review the importance of being able to check your own pulse for safety during independent exercise       Expected Outcomes  Short Term: Able to explain why pulse checking is important during independent exercise;Long Term: Able to check pulse independently and accurately       Understanding of Exercise Prescription  Yes       Intervention  Provide education, explanation, and written materials on patient's individual exercise prescription  Expected Outcomes  Short Term: Able to explain program exercise prescription;Long Term: Able to explain home exercise prescription to exercise independently          Exercise Goals Re-Evaluation : Exercise Goals Re-Evaluation    Row Name 11/19/17 1016 11/23/17 1044           Exercise Goal Re-Evaluation   Exercise Goals Review  Increase Physical Activity;Increase Strength and Stamina;Able to understand and use rate of perceived exertion (RPE) scale;Knowledge and understanding of Target Heart Rate Range (THRR);Understanding of Exercise Prescription;Able to understand and use Dyspnea scale  Increase Physical Activity;Able to understand and  use rate of perceived exertion (RPE) scale;Knowledge and understanding of Target Heart Rate Range (THRR);Increase Strength and Stamina;Able to understand and use Dyspnea scale      Comments  Reviewed RPE scale, THR and program prescription with pt today.  Pt voiced understanding and was given a copy of goals to take home.   Reviewed home exercise with pt today.  Pt plans to go to [facility] for exercise.  Reviewed THR, pulse, RPE, sign and symptoms, NTG use, and when to call 911 or MD.  Also discussed weather considerations and indoor options.  Pt voiced understanding.      Expected Outcomes  Short: Use RPE daily to regulate intensity. Long: Follow program prescription in THR.  Short - exercise 3 days per week between LW and home Long - exercise 3 days on his own          Discharge Exercise Prescription (Final Exercise Prescription Changes): Exercise Prescription Changes - 11/23/17 1000      Home Exercise Plan   Plans to continue exercise at  [facility] (comment)   [employer]   Frequency  Add 2 additional days to program exercise sessions.    Initial Home Exercises Provided  11/23/17       Nutrition:  Target Goals: Understanding of nutrition guidelines, daily intake of sodium '1500mg'$ , cholesterol '200mg'$ , calories 30% from fat and 7% or less from saturated fats, daily to have 5 or more servings of fruits and vegetables.  Biometrics: Pre Biometrics - 11/13/17 1157      Pre Biometrics   Height  5' 10.5" (1.791 m)    Weight  197 lb 11.2 oz (89.7 kg)    Waist Circumference  42.5 inches    Hip Circumference  43 inches    Waist to Hip Ratio  0.99 %    BMI (Calculated)  27.96        Nutrition Therapy Plan and Nutrition Goals: Nutrition Therapy & Goals - 11/19/17 1043      Nutrition Therapy   RD appointment deferred  Yes   reports his wife to be monitoring his diet      Nutrition Assessments: Nutrition Assessments - 11/13/17 1100      MEDFICTS Scores   Pre Score  33        Nutrition Goals Re-Evaluation:   Nutrition Goals Discharge (Final Nutrition Goals Re-Evaluation):   Psychosocial: Target Goals: Acknowledge presence or absence of significant depression and/or stress, maximize coping skills, provide positive support system. Participant is able to verbalize types and ability to use techniques and skills needed for reducing stress and depression.   Initial Review & Psychosocial Screening: Initial Psych Review & Screening - 11/13/17 1117      Initial Review   Current issues with  None Identified      Family Dynamics   Good Support System?  Yes    Comments  Wife  and Kids are good support      Screening Interventions   Interventions  Encouraged to exercise;Program counselor consult;Provide feedback about the scores to participant;To provide support and resources with identified psychosocial needs    Expected Outcomes  Short Term goal: Utilizing psychosocial counselor, staff and physician to assist with identification of specific Stressors or current issues interfering with healing process. Setting desired goal for each stressor or current issue identified.;Long Term Goal: Stressors or current issues are controlled or eliminated.;Short Term goal: Identification and review with participant of any Quality of Life or Depression concerns found by scoring the questionnaire.;Long Term goal: The participant improves quality of Life and PHQ9 Scores as seen by post scores and/or verbalization of changes       Quality of Life Scores:  Scores of 19 and below usually indicate a poorer quality of life in these areas.  A difference of  2-3 points is a clinically meaningful difference.  A difference of 2-3 points in the total score of the Quality of Life Index has been associated with significant improvement in overall quality of life, self-image, physical symptoms, and general health in studies assessing change in quality of life.  PHQ-9: Recent Review Flowsheet Data     Depression screen [facility] 2/9 11/13/2017 04/10/2016 12/28/2015 11/23/2015   Decreased Interest 0 0 0 0   Down, Depressed, Hopeless 0 0 0 0   PHQ - 2 Score 0 0 0 0   Altered sleeping 0 0 - 0   Tired, decreased energy 0 0 - 0   Change in appetite 0 0 - 0   Feeling bad or failure about yourself  0 0 - 0   Trouble concentrating 0 0 - 0   Moving slowly or fidgety/restless 0 0 - 0   Suicidal thoughts 0 0 - 0   PHQ-9 Score 0 0 - 0   Difficult doing work/chores Not difficult at all - - -     Interpretation of Total Score  Total Score Depression Severity:  1-4 = Minimal depression, 5-9 = Mild depression, 10-14 = Moderate depression, 15-19 = Moderately severe depression, 20-27 = Severe depression   Psychosocial Evaluation and Intervention: Psychosocial Evaluation - 11/19/17 1107      Psychosocial Evaluation & Interventions   Comments  Mr. Morten Stoudt)  has returned to this program after almost a year and counselor met with him for his initial psychosocial evaluation today. He is an 82 year old who has a strong support system with a spouse of 33 years and (2) adult children who live in other states.  Karron is also actively involved in his faith community.  He has multiple health issues with diabetes; high blood pressure and gout in addition to his pulmonary condition.  He reports sleeping "great" and having a good appetite.  He is typically in a positive mood and denies any history or current symptoms of depression or anxiety.  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  Fortunato's goals for this program are to be more consistent in exercise and increase his stamina and strength.  He plans to go to [place] with his spouse in October and would like to be more fit to enjoy this trip.  Staff will follow with Evette Doffing throughout the course of this program.       Expected Outcomes  Short:  Pratyush will get back into a routine of exercise to become more consistent and  to increase  his stamina and strength.   Long:  Jabriel will make exercise a healthy lifestyle for himself.    Continue Psychosocial Services   Follow up required by staff       Psychosocial Re-Evaluation:   Psychosocial Discharge (Final Psychosocial Re-Evaluation):   Education: Education Goals: Education classes will be provided on a weekly basis, covering required topics. Participant will state understanding/return demonstration of topics presented.  Learning Barriers/Preferences: Learning Barriers/Preferences - 11/13/17 1104      Learning Barriers/Preferences   Learning Barriers  None    Learning Preferences  Group Instruction;Individual Instruction;Pictoral;Skilled Demonstration;Verbal Instruction;Video;Written Material       Education Topics:  Initial Evaluation Education: - Verbal, written and demonstration of respiratory meds, oximetry and breathing techniques. Instruction on use of nebulizers and MDIs and importance of monitoring MDI activations.   Pulmonary Rehab from 11/28/2017 in [facility] Cardiac and Pulmonary Rehab  Date  11/13/17  Educator  [facility]  Instruction Review Code  1- Verbalizes Understanding      General Nutrition Guidelines/Fats and Fiber: -Group instruction provided by verbal, written material, models and posters to present the general guidelines for heart healthy nutrition. Gives an explanation and review of dietary fats and fiber.   Pulmonary Rehab from 11/28/2017 in [facility] Cardiac and Pulmonary Rehab  Date  11/21/17  Educator  LB  Instruction Review Code  5- Refused Teaching      Controlling Sodium/Reading Food Labels: -Group verbal and written material supporting the discussion of sodium use in heart healthy nutrition. Review and explanation with models, verbal and written materials for utilization of the food label.   Pulmonary Rehab from 11/28/2017 in [facility] Cardiac and Pulmonary Rehab  Date  11/28/17  Educator  LB  Instruction Review Code  1- Verbalizes  Understanding      Exercise Physiology & General Exercise Guidelines: - Group verbal and written instruction with models to review the exercise physiology of the cardiovascular system and associated critical values. Provides general exercise guidelines with specific guidelines to those with heart or lung disease.    Pulmonary Rehab from 04/19/2016 in [facility] Cardiac and Pulmonary Rehab  Date  01/26/16  Educator  AS  Instruction Review Code (retired)  2- meets goals/outcomes      Aerobic Exercise & Resistance Training: - Gives group verbal and written instruction on the various components of exercise. Focuses on aerobic and resistive training programs and the benefits of this training and how to safely progress through these programs.   Pulmonary Rehab from 04/19/2016 in [facility] Cardiac and Pulmonary Rehab  Date  12/01/15  Educator  Nada Maclachlan  Instruction Review Code (retired)  2- Statistician, Balance, Mind/Body Relaxation: Provides group verbal/written instruction on the benefits of flexibility and balance training, including mind/body exercise modes such as yoga, pilates and tai chi.  Demonstration and skill practice provided.   Stress and Anxiety: - Provides group verbal and written instruction about the health risks of elevated stress and causes of high stress.  Discuss the correlation between heart/lung disease and anxiety and treatment options. Review healthy ways to manage with stress and anxiety.   Depression: - Provides group verbal and written instruction on the correlation between heart/lung disease and depressed mood, treatment options, and the stigmas associated with seeking treatment.   Exercise & Equipment Safety: - Individual verbal instruction and demonstration of equipment use and safety with use of the equipment.   Pulmonary Rehab from 11/28/2017 in [facility] Cardiac and Pulmonary Rehab  Date  11/13/17  Educator  [place]  Instruction Review Code  1-  Verbalizes Understanding      Infection Prevention: - Provides verbal and written material to individual with discussion of infection control including proper hand washing and proper equipment cleaning during exercise session.   Pulmonary Rehab from 11/28/2017 in [facility] Cardiac and Pulmonary Rehab  Date  11/13/17  Educator  [facility]  Instruction Review Code  1- Verbalizes Understanding      Falls Prevention: - Provides verbal and written material to individual with discussion of falls prevention and safety.   Pulmonary Rehab from 11/28/2017 in [facility] Cardiac and Pulmonary Rehab  Date  11/13/17  Educator  [facility]  Instruction Review Code  1- Verbalizes Understanding      Diabetes: - Individual verbal and written instruction to review signs/symptoms of diabetes, desired ranges of glucose level fasting, after meals and with exercise. Advice that pre and post exercise glucose checks will be done for 3 sessions at entry of program.   Pulmonary Rehab from 04/19/2016 in [facility] Cardiac and Pulmonary Rehab  Date  11/29/15  Educator  AS  Instruction Review Code (retired)  2- meets goals/outcomes      Chronic Lung Diseases: - Group verbal and written instruction to review updates, respiratory medications, advancements in procedures and treatments. Discuss use of supplemental oxygen including available portable oxygen systems, continuous and intermittent flow rates, concentrators, personal use and safety guidelines. Review proper use of inhaler and spacers. Provide informative websites for self-education.    Pulmonary Rehab from 11/28/2017 in [facility] Cardiac and Pulmonary Rehab  Date  11/23/17  Educator  [facility]  Instruction Review Code  1- Verbalizes Understanding      Energy Conservation: - Provide group verbal and written instruction for methods to conserve energy, plan and organize activities. Instruct on pacing techniques, use of adaptive equipment and posture/positioning to relieve shortness of  breath.   Triggers and Exacerbations: - Group verbal and written instruction to review types of environmental triggers and ways to prevent exacerbations. Discuss weather changes, air quality and the benefits of nasal washing. Review warning signs and symptoms to help prevent infections. Discuss techniques for effective airway clearance, coughing, and vibrations.   AED/CPR: - Group verbal and written instruction with the use of models to demonstrate the basic use of the AED with the basic ABC's of resuscitation.   Pulmonary Rehab from 04/19/2016 in [facility] Cardiac and Pulmonary Rehab  Date  01/21/16  Educator  CE  Instruction Review Code (retired)  2- Lawyer and Physiology of the Lungs: - Group verbal and written instruction with the use of models to provide basic lung anatomy and physiology related to function, structure and complications of lung disease.   Anatomy & Physiology of the Heart: - Group verbal and written instruction and models provide basic cardiac anatomy and physiology, with the coronary electrical and arterial systems. Review of Valvular disease and Heart Failure   Cardiac Medications: - Group verbal and written instruction to review commonly prescribed medications for heart disease. Reviews the medication, class of the drug, and side effects.   Pulmonary Rehab from 04/19/2016 in [facility] Cardiac and Pulmonary Rehab  Date  12/24/15  Educator  CE  Instruction Review Code (retired)  2- meets goals/outcomes      Know Your Numbers and Risk Factors: -Group verbal and written instruction about important numbers in your health.  Discussion of what are risk factors and how they play a role in the disease  process.  Review of Cholesterol, Blood Pressure, Diabetes, and BMI and the role they play in your overall health.   Sleep Hygiene: -Provides group verbal and written instruction about how sleep can affect your health.  Define sleep hygiene, discuss sleep  cycles and impact of sleep habits. Review good sleep hygiene tips.    Other: -Provides group and verbal instruction on various topics (see comments)    Knowledge Questionnaire Score: Knowledge Questionnaire Score - 11/13/17 1113      Knowledge Questionnaire Score   Pre Score  15/18   Reviewed with patient       Core Components/Risk Factors/Patient Goals at Admission: Personal Goals and Risk Factors at Admission - 11/13/17 1119      Core Components/Risk Factors/Patient Goals on Admission    Weight Management  Yes;Weight Maintenance    Intervention  Weight Management/Obesity: Establish reasonable short term and long term weight goals.;Weight Management: Provide education and appropriate resources to help participant work on and attain dietary goals.;Weight Management: Develop a combined nutrition and exercise program designed to reach desired caloric intake, while maintaining appropriate intake of nutrient and fiber, sodium and fats, and appropriate energy expenditure required for the weight goal.    Admit Weight  197 lb 11.2 oz (89.7 kg)    Goal Weight: Short Term  195 lb (88.5 kg)    Goal Weight: Long Term  195 lb (88.5 kg)    Expected Outcomes  Short Term: Continue to assess and modify interventions until short term weight is achieved;Long Term: Adherence to nutrition and physical activity/exercise program aimed toward attainment of established weight goal;Weight Maintenance: Understanding of the daily nutrition guidelines, which includes 25-35% calories from fat, 7% or less cal from saturated fats, less than '200mg'$  cholesterol, less than 1.5gm of sodium, & 5 or more servings of fruits and vegetables daily;Understanding recommendations for meals to include 15-35% energy as protein, 25-35% energy from fat, 35-60% energy from carbohydrates, less than '200mg'$  of dietary cholesterol, 20-35 gm of total fiber daily;Understanding of distribution of calorie intake throughout the day with the  consumption of 4-5 meals/snacks    Improve shortness of breath with ADL's  Yes    Intervention  Provide education, individualized exercise plan and daily activity instruction to help decrease symptoms of SOB with activities of daily living.    Expected Outcomes  Short Term: Improve cardiorespiratory fitness to achieve a reduction of symptoms when performing ADLs;Long Term: Be able to perform more ADLs without symptoms or delay the onset of symptoms    Diabetes  Yes    Intervention  Provide education about signs/symptoms and action to take for hypo/hyperglycemia.;Provide education about proper nutrition, including hydration, and aerobic/resistive exercise prescription along with prescribed medications to achieve blood glucose in normal ranges: Fasting glucose 65-99 mg/dL    Expected Outcomes  Short Term: Participant verbalizes understanding of the signs/symptoms and immediate care of hyper/hypoglycemia, proper foot care and importance of medication, aerobic/resistive exercise and nutrition plan for blood glucose control.;Long Term: Attainment of HbA1C < 7%.    Hypertension  Yes    Intervention  Provide education on lifestyle modifcations including regular physical activity/exercise, weight management, moderate sodium restriction and increased consumption of fresh fruit, vegetables, and low fat dairy, alcohol moderation, and smoking cessation.;Monitor prescription use compliance.    Expected Outcomes  Short Term: Continued assessment and intervention until BP is < 140/12m HG in hypertensive participants. < 130/821mHG in hypertensive participants with diabetes, heart failure or chronic kidney disease.;Long Term: Maintenance of blood  pressure at goal levels.    Lipids  Yes    Intervention  Provide education and support for participant on nutrition & aerobic/resistive exercise along with prescribed medications to achieve LDL '70mg'$ , HDL >'40mg'$ .    Expected Outcomes  Short Term: Participant states  understanding of desired cholesterol values and is compliant with medications prescribed. Participant is following exercise prescription and nutrition guidelines.;Long Term: Cholesterol controlled with medications as prescribed, with individualized exercise RX and with personalized nutrition plan. Value goals: LDL < '70mg'$ , HDL > 40 mg.       Core Components/Risk Factors/Patient Goals Review:    Core Components/Risk Factors/Patient Goals at Discharge (Final Review):    ITP Comments: ITP Comments    Row Name 11/13/17 1042 12/03/17 0820         ITP Comments  Medical Evaluation completed. Chart sent for review and changes to Dr. Emily Filbert Director of [place]. Diagnosis can be found in [facility] encounter 09/26/17  30 day review completed. ITP sent to Dr. Emily Filbert Director of [place]. Continue with ITP unless changes are made by physician         Comments: 30 day review

## 2017-12-05 DIAGNOSIS — J8410 Pulmonary fibrosis, unspecified: Secondary | ICD-10-CM

## 2017-12-05 DIAGNOSIS — J849 Interstitial pulmonary disease, unspecified: Secondary | ICD-10-CM | POA: Diagnosis not present

## 2017-12-05 DIAGNOSIS — J84112 Idiopathic pulmonary fibrosis: Secondary | ICD-10-CM | POA: Diagnosis not present

## 2017-12-05 NOTE — Progress Notes (Signed)
Daily Session Note  Patient Details  Name: Jeremiah Gibson MRN: [id number] Date of Birth: 18-Aug-1935 Referring Provider:     Pulmonary Rehab from 11/13/2017 in [facility] Cardiac and Pulmonary Rehab  Referring Provider  Alba      Encounter Date: 12/05/2017  Check In: Session Check In - 12/05/17 1023      Check-In   Supervising physician immediately available to respond to emergencies  [facility] immediately available ER MD    Physician(s)  Jimmye Norman and Darl Householder    Location  [facility]-Cardiac & Pulmonary Rehab    Staff Present  Alberteen Sam, MA, RCEP, CCRP, Exercise Physiologist;Joseph Foy [place], [place], ACSM CEP, Exercise Physiologist    Medication changes reported      No    Fall or balance concerns reported     No    Warm-up and Cool-down  Performed as group-led instruction    Resistance Training Performed  Yes    VAD Patient?  No    PAD/SET Patient?  No      Pain Assessment   Currently in Pain?  No/denies    Multiple Pain Sites  No          Social History   Tobacco Use  Smoking Status Former Smoker  . Packs/day: 2.00  . Years: 20.00  . Pack years: 40.00  . Types: Cigarettes, Pipe  . Last attempt to quit: 11/22/1999  . Years since quitting: 18.0  Smokeless Tobacco Never Used  Tobacco Comment   quit cigs in 72 and pipe in 01    Goals Met:  Independence with exercise equipment Exercise tolerated well No report of cardiac concerns or symptoms Strength training completed today  Goals Unmet:  Not Applicable  Comments: Pt able to follow exercise prescription today without complaint.  Will continue to monitor for progression.    Dr. Emily Filbert is Medical Director for [place] and [facility].

## 2017-12-07 DIAGNOSIS — J849 Interstitial pulmonary disease, unspecified: Secondary | ICD-10-CM

## 2017-12-07 DIAGNOSIS — J84112 Idiopathic pulmonary fibrosis: Secondary | ICD-10-CM | POA: Diagnosis not present

## 2017-12-07 NOTE — Progress Notes (Signed)
Daily Session Note  Patient Details  Name: Jeremiah Gibson MRN: [id number] Date of Birth: 04/10/1935 Referring Provider:     Pulmonary Rehab from 11/13/2017 in [facility] Cardiac and Pulmonary Rehab  Referring Provider  Alba      Encounter Date: 12/07/2017  Check In: Session Check In - 12/07/17 1003      Check-In   Supervising physician immediately available to respond to emergencies  [facility] immediately available ER MD    Physician(s)  Dr. Corky Downs and Clearnce Hasten    Location  [facility]-Cardiac & Pulmonary Rehab    Staff Present  Nada Maclachlan, BA, ACSM CEP, Exercise Physiologist;Jessica Luan Pulling, MA, RCEP, CCRP, Exercise Physiologist;Meredith Sherryll Burger, RN BSN    Medication changes reported      No    Fall or balance concerns reported     No    Warm-up and Cool-down  Performed as group-led Higher education careers adviser Performed  Yes    VAD Patient?  No    PAD/SET Patient?  No      Pain Assessment   Currently in Pain?  No/denies          Social History   Tobacco Use  Smoking Status Former Smoker  . Packs/day: 2.00  . Years: 20.00  . Pack years: 40.00  . Types: Cigarettes, Pipe  . Last attempt to quit: 11/22/1999  . Years since quitting: 18.0  Smokeless Tobacco Never Used  Tobacco Comment   quit cigs in 72 and pipe in 01    Goals Met:  Independence with exercise equipment Exercise tolerated well No report of cardiac concerns or symptoms Strength training completed today  Goals Unmet:  Not Applicable  Comments: Pt able to follow exercise prescription today without complaint.  Will continue to monitor for progression.    Dr. Emily Filbert is Medical Director for [place] and [facility].

## 2017-12-10 ENCOUNTER — Encounter: Payer: Medicare Other | Admitting: *Deleted

## 2017-12-10 DIAGNOSIS — J84112 Idiopathic pulmonary fibrosis: Secondary | ICD-10-CM | POA: Diagnosis not present

## 2017-12-10 DIAGNOSIS — J849 Interstitial pulmonary disease, unspecified: Secondary | ICD-10-CM

## 2017-12-10 DIAGNOSIS — J8410 Pulmonary fibrosis, unspecified: Secondary | ICD-10-CM

## 2017-12-10 NOTE — Progress Notes (Signed)
Daily Session Note  Patient Details  Name: Jeremiah Gibson MRN: [id number] Date of Birth: Jan 02, 1936 Referring Provider:     Pulmonary Rehab from 11/13/2017 in [facility] Cardiac and Pulmonary Rehab  Referring Provider  Alba      Encounter Date: 12/10/2017  Check In: Session Check In - 12/10/17 1010      Check-In   Supervising physician immediately available to respond to emergencies  [facility] immediately available ER MD    Physician(s)  Drs. Dallas Breeding    Location  [facility]-Cardiac & Pulmonary Rehab    Staff Present  Earlean Shawl, BS, ACSM CEP, Exercise Physiologist;Joseph [employer], [place], ACSM CEP, Exercise Physiologist    Medication changes reported      No    Fall or balance concerns reported     No    Tobacco Cessation  No Change    Warm-up and Cool-down  Performed as group-led instruction    Resistance Training Performed  Yes    VAD Patient?  No    PAD/SET Patient?  No      Pain Assessment   Currently in Pain?  No/denies    Multiple Pain Sites  No          Social History   Tobacco Use  Smoking Status Former Smoker  . Packs/day: 2.00  . Years: 20.00  . Pack years: 40.00  . Types: Cigarettes, Pipe  . Last attempt to quit: 11/22/1999  . Years since quitting: 18.0  Smokeless Tobacco Never Used  Tobacco Comment   quit cigs in 72 and pipe in 01    Goals Met:  Proper associated with RPD/PD & O2 Sat Independence with exercise equipment Exercise tolerated well No report of cardiac concerns or symptoms Strength training completed today  Goals Unmet:  Not Applicable  Comments: Pt able to follow exercise prescription today without complaint.  Will continue to monitor for progression.    Dr. Emily Filbert is Medical Director for [place] and [facility].

## 2017-12-12 ENCOUNTER — Encounter: Payer: Medicare Other | Attending: Pulmonary Disease | Admitting: *Deleted

## 2017-12-12 DIAGNOSIS — J84112 Idiopathic pulmonary fibrosis: Secondary | ICD-10-CM | POA: Diagnosis not present

## 2017-12-12 DIAGNOSIS — J849 Interstitial pulmonary disease, unspecified: Secondary | ICD-10-CM | POA: Insufficient documentation

## 2017-12-12 NOTE — Progress Notes (Signed)
Daily Session Note  Patient Details  Name: Jeremiah Gibson MRN: [id number] Date of Birth: 22-Nov-1935 Referring Provider:     Pulmonary Rehab from 11/13/2017 in [facility] Cardiac and Pulmonary Rehab  Referring Provider  Alba      Encounter Date: 12/12/2017  Check In: Session Check In - 12/12/17 1016      Check-In   Supervising physician immediately available to respond to emergencies  [facility] immediately available ER MD    Physician(s)  Drs. Joni Fears and [place]    Location  [facility]-Cardiac & Pulmonary Rehab    Staff Present  Justin Mend [place], [place], RCEP, CCRP, Exercise Physiologist;Amanda Oletta Darter, [place], ACSM CEP, Exercise Physiologist    Medication changes reported      No    Fall or balance concerns reported     No    Warm-up and Cool-down  Performed as group-led instruction    Resistance Training Performed  Yes    VAD Patient?  No    PAD/SET Patient?  No      Pain Assessment   Currently in Pain?  No/denies          Social History   Tobacco Use  Smoking Status Former Smoker  . Packs/day: 2.00  . Years: 20.00  . Pack years: 40.00  . Types: Cigarettes, Pipe  . Last attempt to quit: 11/22/1999  . Years since quitting: 18.0  Smokeless Tobacco Never Used  Tobacco Comment   quit cigs in 72 and pipe in 01    Goals Met:  Independence with exercise equipment Exercise tolerated well No report of cardiac concerns or symptoms Strength training completed today  Goals Unmet:  Not Applicable  Comments: Pt able to follow exercise prescription today without complaint.  Will continue to monitor for progression.   Dr. Emily Filbert is Medical Director for [place] and [facility].

## 2017-12-14 ENCOUNTER — Encounter: Payer: Medicare Other | Admitting: *Deleted

## 2017-12-14 DIAGNOSIS — J849 Interstitial pulmonary disease, unspecified: Secondary | ICD-10-CM | POA: Diagnosis not present

## 2017-12-14 DIAGNOSIS — J84112 Idiopathic pulmonary fibrosis: Secondary | ICD-10-CM | POA: Diagnosis not present

## 2017-12-14 NOTE — Progress Notes (Signed)
Daily Session Note  Patient Details  Name: Jeremiah Gibson MRN: [id number] Date of Birth: 1935-09-17 Referring Provider:     Pulmonary Rehab from 11/13/2017 in [facility] Cardiac and Pulmonary Rehab  Referring Provider  Alba      Encounter Date: 12/14/2017  Check In: Session Check In - 12/14/17 1011      Check-In   Supervising physician immediately available to respond to emergencies  [facility] immediately available ER MD    Physician(s)  Dr. Reita Cliche and Alfred Levins    Location  [facility]-Cardiac & Pulmonary Rehab    Staff Present  Renita Papa, RN Vickki Hearing, BA, ACSM CEP, Exercise Physiologist;Nana Addai, RN BSN    Medication changes reported      No    Fall or balance concerns reported     No    Warm-up and Cool-down  Performed as group-led instruction    Resistance Training Performed  Yes    VAD Patient?  No    PAD/SET Patient?  No      Pain Assessment   Currently in Pain?  No/denies          Social History   Tobacco Use  Smoking Status Former Smoker  . Packs/day: 2.00  . Years: 20.00  . Pack years: 40.00  . Types: Cigarettes, Pipe  . Last attempt to quit: 11/22/1999  . Years since quitting: 18.0  Smokeless Tobacco Never Used  Tobacco Comment   quit cigs in 72 and pipe in 01    Goals Met:  Proper associated with RPD/PD & O2 Sat Independence with exercise equipment Using PLB without cueing & demonstrates good technique Exercise tolerated well No report of cardiac concerns or symptoms Strength training completed today  Goals Unmet:  Not Applicable  Comments: Pt able to follow exercise prescription today without complaint.  Will continue to monitor for progression.    Dr. Emily Filbert is Medical Director for [place] and [facility].

## 2017-12-17 ENCOUNTER — Encounter: Payer: Medicare Other | Admitting: *Deleted

## 2017-12-17 DIAGNOSIS — J8410 Pulmonary fibrosis, unspecified: Secondary | ICD-10-CM

## 2017-12-17 DIAGNOSIS — J849 Interstitial pulmonary disease, unspecified: Secondary | ICD-10-CM | POA: Diagnosis not present

## 2017-12-17 DIAGNOSIS — J84112 Idiopathic pulmonary fibrosis: Secondary | ICD-10-CM | POA: Diagnosis not present

## 2017-12-17 NOTE — Progress Notes (Signed)
Daily Session Note  Patient Details  Name: Derrian Poli MRN: [id number] Date of Birth: July 10, 1935 Referring Provider:     Pulmonary Rehab from 11/13/2017 in [facility] Cardiac and Pulmonary Rehab  Referring Provider  Alba      Encounter Date: 12/17/2017  Check In: Session Check In - 12/17/17 1015      Check-In   Supervising physician immediately available to respond to emergencies  [facility] immediately available ER MD    Physician(s)  Dr. Corky Downs and Dr. Quentin Cornwall    Location  [facility]-Cardiac & Pulmonary Rehab    Staff Present  Justin Mend RCP,RRT,BSRT;Amanda Oletta [place], [place], ACSM CEP, Exercise Physiologist;Gregorey Nabor Amedeo Plenty, BS, ACSM CEP, Exercise Physiologist    Medication changes reported      No    Fall or balance concerns reported     No    Tobacco Cessation  No Change    Warm-up and Cool-down  Performed as group-led instruction    Resistance Training Performed  Yes    VAD Patient?  No    PAD/SET Patient?  No      Pain Assessment   Currently in Pain?  No/denies    Multiple Pain Sites  No          Social History   Tobacco Use  Smoking Status Former Smoker  . Packs/day: 2.00  . Years: 20.00  . Pack years: 40.00  . Types: Cigarettes, Pipe  . Last attempt to quit: 11/22/1999  . Years since quitting: 18.0  Smokeless Tobacco Never Used  Tobacco Comment   quit cigs in 72 and pipe in 01    Goals Met:  Proper associated with RPD/PD & O2 Sat Independence with exercise equipment Exercise tolerated well No report of cardiac concerns or symptoms Strength training completed today  Goals Unmet:  Not Applicable  Comments: Pt able to follow exercise prescription today without complaint.  Will continue to monitor for progression.    Dr. Emily Filbert is Medical Director for [place] and [facility].

## 2017-12-18 DIAGNOSIS — M779 Enthesopathy, unspecified: Secondary | ICD-10-CM | POA: Diagnosis not present

## 2017-12-18 DIAGNOSIS — E1140 Type 2 diabetes mellitus with diabetic neuropathy, unspecified: Secondary | ICD-10-CM | POA: Diagnosis not present

## 2017-12-18 DIAGNOSIS — L851 Acquired keratosis [keratoderma] palmaris et plantaris: Secondary | ICD-10-CM | POA: Diagnosis not present

## 2017-12-18 DIAGNOSIS — B351 Tinea unguium: Secondary | ICD-10-CM | POA: Diagnosis not present

## 2017-12-19 DIAGNOSIS — J8410 Pulmonary fibrosis, unspecified: Secondary | ICD-10-CM

## 2017-12-19 DIAGNOSIS — J849 Interstitial pulmonary disease, unspecified: Secondary | ICD-10-CM

## 2017-12-19 DIAGNOSIS — J84112 Idiopathic pulmonary fibrosis: Secondary | ICD-10-CM | POA: Diagnosis not present

## 2017-12-19 NOTE — Progress Notes (Signed)
Daily Session Note  Patient Details  Name: Jeremiah Gibson MRN: [id number] Date of Birth: 08/06/1935 Referring Provider:     Pulmonary Rehab from 11/13/2017 in [facility]  Referring Provider  Alba      Encounter Date: 12/19/2017  Check In: Session Check In - 12/19/17 1042      Check-In   Supervising physician immediately available to respond to emergencies  [facility] immediately available ER MD    Physician(s)  Darl Householder and Jimmye Norman    Location  [facility]    Staff Present  Alberteen Sam, MA, RCEP, CCRP, Exercise Physiologist;Joseph Foy [place], [place], ACSM CEP, Exercise Physiologist    Medication changes reported      No    Fall or balance concerns reported     No    Warm-up and Cool-down  Performed as group-led instruction    Resistance Training Performed  Yes    VAD Patient?  No    PAD/SET Patient?  No      Pain Assessment   Currently in Pain?  No/denies    Multiple Pain Sites  No          Social History   Tobacco Use  Smoking Status Former Smoker  . Packs/day: 2.00  . Years: 20.00  . Pack years: 40.00  . Types: Cigarettes, Pipe  . Last attempt to quit: 11/22/1999  . Years since quitting: 18.0  Smokeless Tobacco Never Used  Tobacco Comment   quit cigs in 72 and pipe in 3    Goals Met:  Proper associated with RPD/PD & O2 Sat Independence with exercise equipment Exercise tolerated well Strength training completed today  Goals Unmet:  Not Applicable  Comments: Pt able to follow exercise prescription today without complaint.  Will continue to monitor for progression.    Dr. Emily Filbert is Medical Director for [place] and [facility].

## 2017-12-31 DIAGNOSIS — J849 Interstitial pulmonary disease, unspecified: Secondary | ICD-10-CM

## 2017-12-31 NOTE — Progress Notes (Signed)
Pulmonary Individual Treatment Plan  Patient Details  Name: Naser Schuld MRN: [id number] Date of Birth: [birth year] Referring Provider:     Pulmonary Rehab from 11/13/2017 in [facility] Cardiac and Pulmonary Rehab  Referring Provider  Alba      Initial Encounter Date:    Pulmonary Rehab from 11/13/2017 in [facility] Cardiac and Pulmonary Rehab  Date  11/13/17      Visit Diagnosis: Interstitial lung disease ([place])  Patient's Home Medications on Admission:  Current Outpatient Medications:  .  albuterol (VENTOLIN HFA) 108 (90 Base) MCG/ACT inhaler, Inhale 2 puffs into the lungs every 4 (four) hours as needed for wheezing or shortness of breath., Disp: 3 Inhaler, Rfl: 3 .  allopurinol (ZYLOPRIM) 100 MG tablet, TAKE 2 TABLETS BY MOUTH TWICE DAILY. GENERIC EQUIVALENT FOR ZYLOPRIM, Disp: 360 tablet, Rfl: 3 .  amLODipine (NORVASC) 2.5 MG tablet, Take 1 tablet (2.5 mg total) by mouth daily., Disp: 90 tablet, Rfl: 4 .  aspirin 81 MG tablet, Take 81 mg by mouth every evening. , Disp: , Rfl:  .  clopidogrel (PLAVIX) 75 MG tablet, TAKE 1 TABLET BY MOUTH DAILY GENERIC EQUIVALENT FOR PLAVIX, Disp: 90 tablet, Rfl: 4 .  finasteride (PROSCAR) 5 MG tablet, TAKE 1 TABLET BY MOUTH EVERY DAY. GENERIC EQUIVALENT FOR PROSCAR, Disp: 90 tablet, Rfl: 4 .  fish oil-omega-3 fatty acids 1000 MG capsule, Take 1 g by mouth 2 (two) times daily. , Disp: , Rfl:  .  fluticasone (FLONASE) 50 MCG/ACT nasal spray, Place 2 sprays into both nostrils daily., Disp: 16 g, Rfl: 1 .  Fluticasone-Salmeterol (ADVAIR DISKUS) 250-50 MCG/DOSE AEPB, Inhale 1 puff into the lungs 2 (two) times daily., Disp: 3 each, Rfl: 5 .  glipiZIDE (GLUCOTROL XL) 5 MG 24 hr tablet, Take 1 tablet (5 mg total) by mouth daily with breakfast., Disp: 90 tablet, Rfl: 2 .  glucose blood (ONE TOUCH ULTRA TEST) test strip, Use as instructed to check blood sugar daily. Dx: E11.21, Disp: 100 each, Rfl: 4 .  losartan (COZAAR) 100 MG tablet, TAKE 1 TABLET BY MOUTH EVERY DAY FOR  BLOOD PRESSURE, Disp: 90 tablet, Rfl: 3 .  Magnesium 400 MG TABS, Take 400 mg by mouth 2 (two) times daily. , Disp: , Rfl:  .  metFORMIN (GLUCOPHAGE) 500 MG tablet, TAKE 2 TABLETS BY MOUTH TWICE DAILY WITH A MEAL, Disp: 360 tablet, Rfl: 4 .  MULTIPLE VITAMIN PO, MULTIVITAMINS (Oral Tablet)  1 Every Day for 0 days  Quantity: 0.00;  Refills: 0   Ordered :06-Jan-2010  Doy Hutching ;  Started 22-Mar-2009 Active, Disp: , Rfl:  .  Nintedanib (OFEV) 100 MG CAPS, Take 100 mg by mouth 2 (two) times daily with a meal. , Disp: , Rfl:  .  pantoprazole (PROTONIX) 20 MG tablet, Take 20 mg by mouth 2 (two) times daily., Disp: , Rfl:  .  polyethylene glycol-electrolytes (NULYTELY/GOLYTELY) 420 g solution, Take 4,000 mLs by mouth once., Disp: , Rfl:  .  potassium chloride SA (K-DUR,KLOR-CON) 20 MEQ tablet, TAKE 1 TABLET BY MOUTH DAILY, Disp: 90 tablet, Rfl: 4 .  pravastatin (PRAVACHOL) 40 MG tablet, Take 1 tablet (40 mg total) by mouth daily., Disp: 90 tablet, Rfl: 3 .  terazosin (HYTRIN) 5 MG capsule, TAKE 1 CAPSULE BY MOUTH EVERY DAY FOR PROSTATE, Disp: 90 capsule, Rfl: 4 .  triamterene-hydrochlorothiazide (MAXZIDE-25) 37.5-25 MG tablet, TAKE 1 TABLET BY MOUTH EVERY DAY FOR BLOOD PRESSURE GENERIC EQUIVALENT FOR MAXZIDE-25, Disp: 90 tablet, Rfl: 4  Past Medical History: Past Medical  History:  Diagnosis Date  . Anemia   . Arthritis   . Asthma   . Colon polyps   . Cough    CHRONIC  . Diabetes mellitus without complication ([place])   . Diverticulosis of colon (without mention of hemorrhage)   . Dyspnea    pulmonary fibrosis  . Dysrhythmia   . Esophageal reflux   . Gout   . Hyperlipidemia   . Hypertension   . Impotence of organic origin   . Leg cramps   . Pulmonary fibrosis ([place])   . Pulmonary fibrosis ([place])   . Wheezing     Tobacco Use: Social History   Tobacco Use  Smoking Status Former Smoker  . Packs/day: 2.00  . Years: 20.00  . Pack years: 40.00  . Types: Cigarettes, Pipe  . Last  attempt to quit: 11/22/1999  . Years since quitting: 18.1  Smokeless Tobacco Never Used  Tobacco Comment   quit cigs in 72 and pipe in 01    Labs: Recent Review Flowsheet Data    Labs for ITP Cardiac and Pulmonary Rehab Latest Ref Rng & Units 11/21/2016 01/30/2017 03/27/2017 07/23/2017 11/26/2017   Cholestrol 100 - 199 mg/dL - 156 - - 146   LDLCALC 0 - 99 mg/dL - 90 - - 80   HDL >39 mg/dL - 45 - - 45   Trlycerides 0 - 149 mg/dL - 110 - - 104   Hemoglobin A1c 4.0 - 5.6 % 7.3 - 6.7 7.0 6.7(A)       Pulmonary Assessment Scores: Pulmonary Assessment Scores    Row Name 11/13/17 1058         ADL UCSD   ADL Phase  Entry     SOB Score total  10     Rest  0     Walk  0     Stairs  0     Bath  0     Dress  0     Shop  0       CAT Score   CAT Score  9       mMRC Score   mMRC Score  1        Pulmonary Function Assessment: Pulmonary Function Assessment - 11/13/17 1121      Breath   Bilateral Breath Sounds  Clear;Decreased    Shortness of Breath  Yes       Exercise Target Goals: Exercise Program Goal: Individual exercise prescription set using results from initial 6 min walk test and THRR while considering  patient's activity barriers and safety.   Exercise Prescription Goal: Initial exercise prescription builds to 30-45 minutes a day of aerobic activity, 2-3 days per week.  Home exercise guidelines will be given to patient during program as part of exercise prescription that the participant will acknowledge.  Activity Barriers & Risk Stratification:   6 Minute Walk: 6 Minute Walk    Row Name 11/13/17 1201         6 Minute Walk   Phase  Initial     Distance  1300 feet     Walk Time  6 minutes     # of Rest Breaks  0     MPH  2.46     METS  2.8     RPE  11     Perceived Dyspnea   1     VO2 Peak  9.8     Symptoms  No     Resting HR  82  bpm     Resting BP  124/64     Resting Oxygen Saturation   96 %     Exercise Oxygen Saturation  during 6 min walk  94 %      Max Ex. HR  108 bpm     Max Ex. BP  174/84     2 Minute Post BP  144/64       Interval HR   1 Minute HR  87     2 Minute HR  96     3 Minute HR  96     4 Minute HR  104     5 Minute HR  108     6 Minute HR  103     2 Minute Post HR  90     Interval Heart Rate?  Yes       Interval Oxygen   Interval Oxygen?  Yes     Baseline Oxygen Saturation %  96 %     1 Minute Oxygen Saturation %  94 %     1 Minute Liters of Oxygen  0 L     2 Minute Oxygen Saturation %  94 %     2 Minute Liters of Oxygen  0 L     3 Minute Oxygen Saturation %  94 %     3 Minute Liters of Oxygen  0 L     4 Minute Oxygen Saturation %  95 %     4 Minute Liters of Oxygen  0 L     5 Minute Oxygen Saturation %  94 %     5 Minute Liters of Oxygen  0 L     6 Minute Oxygen Saturation %  94 %     6 Minute Liters of Oxygen  0 L     2 Minute Post Oxygen Saturation %  97 %     2 Minute Post Liters of Oxygen  0 L       Oxygen Initial Assessment: Oxygen Initial Assessment - 11/13/17 1122      Home Oxygen   Home Oxygen Device  None    Sleep Oxygen Prescription  None    Home Exercise Oxygen Prescription  None    Home at Rest Exercise Oxygen Prescription  None      Initial 6 min Walk   Oxygen Used  None      Program Oxygen Prescription   Program Oxygen Prescription  None      Intervention   Short Term Goals  To learn and demonstrate proper use of respiratory medications;To learn and demonstrate proper pursed lip breathing techniques or other breathing techniques.;To learn and understand importance of maintaining oxygen saturations>88%;To learn and understand importance of monitoring SPO2 with pulse oximeter and demonstrate accurate use of the pulse oximeter.    Long  Term Goals  Maintenance of O2 saturations>88%;Exhibits proper breathing techniques, such as pursed lip breathing or other method taught during program session;Compliance with respiratory medication;Demonstrates proper use of MDI's;Verbalizes  importance of monitoring SPO2 with pulse oximeter and return demonstration       Oxygen Re-Evaluation: Oxygen Re-Evaluation    Row Name 11/19/17 1015 12/17/17 1636           Program Oxygen Prescription   Program Oxygen Prescription  None  None        Home Oxygen   Home Oxygen Device  None  None      Sleep Oxygen Prescription  None  None      Home Exercise Oxygen Prescription  None  None      Home at Rest Exercise Oxygen Prescription  None  None        Goals/Expected Outcomes   Short Term Goals  To learn and demonstrate proper use of respiratory medications;To learn and demonstrate proper pursed lip breathing techniques or other breathing techniques.;To learn and understand importance of maintaining oxygen saturations>88%;To learn and understand importance of monitoring SPO2 with pulse oximeter and demonstrate accurate use of the pulse oximeter.  To learn and demonstrate proper use of respiratory medications;To learn and demonstrate proper pursed lip breathing techniques or other breathing techniques.;To learn and understand importance of maintaining oxygen saturations>88%;To learn and understand importance of monitoring SPO2 with pulse oximeter and demonstrate accurate use of the pulse oximeter.      Long  Term Goals  Maintenance of O2 saturations>88%;Exhibits proper breathing techniques, such as pursed lip breathing or other method taught during program session;Compliance with respiratory medication;Demonstrates proper use of MDI's;Verbalizes importance of monitoring SPO2 with pulse oximeter and return demonstration  Maintenance of O2 saturations>88%;Exhibits proper breathing techniques, such as pursed lip breathing or other method taught during program session;Compliance with respiratory medication;Demonstrates proper use of MDI's;Verbalizes importance of monitoring SPO2 with pulse oximeter and return demonstration      Comments  Reviewed PLB technique with pt.  Talked about how it work  and it's important to maintaining his exercise saturations.    Patient states he gets short of breath mostly when he goes up hills and inclines. He does have a pulse oximeter to check his oxygen saturation at home. Informed him why it is important to check when he is exerting himself.      Goals/Expected Outcomes  Short: Become more profiecient at using PLB.   Long: Become independent at using PLB.  Short: monitor oxygen at home with exertion. Long: maintain oxygen saturations above 88 percent independently.         Oxygen Discharge (Final Oxygen Re-Evaluation): Oxygen Re-Evaluation - 12/17/17 1636      Program Oxygen Prescription   Program Oxygen Prescription  None      Home Oxygen   Home Oxygen Device  None    Sleep Oxygen Prescription  None    Home Exercise Oxygen Prescription  None    Home at Rest Exercise Oxygen Prescription  None      Goals/Expected Outcomes   Short Term Goals  To learn and demonstrate proper use of respiratory medications;To learn and demonstrate proper pursed lip breathing techniques or other breathing techniques.;To learn and understand importance of maintaining oxygen saturations>88%;To learn and understand importance of monitoring SPO2 with pulse oximeter and demonstrate accurate use of the pulse oximeter.    Long  Term Goals  Maintenance of O2 saturations>88%;Exhibits proper breathing techniques, such as pursed lip breathing or other method taught during program session;Compliance with respiratory medication;Demonstrates proper use of MDI's;Verbalizes importance of monitoring SPO2 with pulse oximeter and return demonstration    Comments  Patient states he gets short of breath mostly when he goes up hills and inclines. He does have a pulse oximeter to check his oxygen saturation at home. Informed him why it is important to check when he is exerting himself.    Goals/Expected Outcomes  Short: monitor oxygen at home with exertion. Long: maintain oxygen saturations  above 88 percent independently.       Initial Exercise Prescription: Initial Exercise Prescription - 11/13/17 1200      Date  of Initial Exercise RX and Referring Provider   Date  11/13/17    Referring Provider  Alba      Recumbant Bike   Level  2    RPM  60    Watts  10    Minutes  15    METs  2.4      T5 Nustep   Level  2    SPM  80    Minutes  15    METs  2.4      Track   Laps  30    Minutes  15    METs  2.4      Prescription Details   Frequency (times per week)  3    Duration  Progress to 45 minutes of aerobic exercise without signs/symptoms of physical distress      Intensity   THRR 40-80% of Max Heartrate  105-128    Ratings of Perceived Exertion  11-15    Perceived Dyspnea  0-4      Resistance Training   Training Prescription  Yes    Weight  3 lb    Reps  10-15       Perform Capillary Blood Glucose checks as needed.  Exercise Prescription Changes: Exercise Prescription Changes    Row Name 11/21/17 1500 11/23/17 1000 12/05/17 1200 12/19/17 1100       Response to Exercise   Blood Pressure (Admit)  162/72  -  106/58  130/58    Blood Pressure (Exercise)  154/74  -  -  -    Blood Pressure (Exit)  120/70  -  108/70  122/66    Heart Rate (Admit)  86 bpm  -  54 bpm  80 bpm    Heart Rate (Exercise)  91 bpm  -  88 bpm  111 bpm    Heart Rate (Exit)  75 bpm  -  70 bpm  84 bpm    Oxygen Saturation (Admit)  96 %  -  94 %  94 %    Oxygen Saturation (Exercise)  97 %  -  95 %  96 %    Oxygen Saturation (Exit)  97 %  -  98 %  92 %    Rating of Perceived Exertion (Exercise)  12  -  12  12    Perceived Dyspnea (Exercise)  2  -  2  2    Symptoms  none  -  none  none    Comments  second day  -  -  -    Duration  Progress to 45 minutes of aerobic exercise without signs/symptoms of physical distress  -  Continue with 45 min of aerobic exercise without signs/symptoms of physical distress.  Continue with 45 min of aerobic exercise without signs/symptoms of physical  distress.    Intensity  THRR New  -  THRR unchanged  THRR unchanged      Progression   Progression  Continue to progress workloads to maintain intensity without signs/symptoms of physical distress.  -  Continue to progress workloads to maintain intensity without signs/symptoms of physical distress.  Continue to progress workloads to maintain intensity without signs/symptoms of physical distress.    Average METs  2  -  2.25  2.4      Resistance Training   Training Prescription  Yes  -  Yes  Yes    Weight  3 lb  -  3 lb  3 lb    Reps  10-15  -  10-15  10-15      Interval Training   Interval Training  -  -  No  No      T5 Nustep   Level  3  -  3  4    SPM  80  -  80  80    Minutes  15  -  15  15    METs  1.9  -  1.9  2      Track   Laps  34  -  34  39    Minutes  15  -  15  15    METs  2.6  -  2.6  2.9      Home Exercise Plan   Plans to continue exercise at  [facility] (comment[facility] [employer]  -  -    Frequency  -  Add 2 additional days to program exercise sessions.  -  -    Initial Home Exercises Provided  -  11/23/17  -  -       Exercise Comments: Exercise Comments    Row Name 11/19/17 1018 11/23/17 1043         Exercise Comments   First full day of exercise!  Patient was oriented to gym and equipment including functions, settings, policies, and procedures.  Patient's individual exercise prescription and treatment plan were reviewed.  All starting workloads were established based on the results of the 6 minute walk test done at initial orientation visit.  The plan for exercise progression was also introduced and progression will be customized based on patient's performance and goals.  Reviewed home exercise with pt today.  Pt plans to go to [facility] for exercise.  Reviewed THR, pulse, RPE, sign and symptoms, NTG use, and when to call 911 or MD.  Also discussed weather considerations and indoor options.  Pt voiced understanding.         Exercise Goals and Review: Exercise  Goals    Row Name 11/13/17 1200             Exercise Goals   Increase Physical Activity  Yes       Intervention  Provide advice, education, support and counseling about physical activity/exercise needs.;Develop an individualized exercise prescription for aerobic and resistive training based on initial evaluation findings, risk stratification, comorbidities and participant's personal goals.       Expected Outcomes  Short Term: Attend rehab on a regular basis to increase amount of physical activity.;Long Term: Add in home exercise to make exercise part of routine and to increase amount of physical activity.;Long Term: Exercising regularly at least 3-5 days a week.       Increase Strength and Stamina  Yes       Intervention  Provide advice, education, support and counseling about physical activity/exercise needs.;Develop an individualized exercise prescription for aerobic and resistive training based on initial evaluation findings, risk stratification, comorbidities and participant's personal goals.       Expected Outcomes  Short Term: Increase workloads from initial exercise prescription for resistance, speed, and METs.;Short Term: Perform resistance training exercises routinely during rehab and add in resistance training at home;Long Term: Improve cardiorespiratory fitness, muscular endurance and strength as measured by increased METs and functional capacity (6MWT)       Able to understand and use rate of perceived exertion (RPE) scale  Yes       Intervention  Provide education and explanation on how to use RPE  scale       Expected Outcomes  Short Term: Able to use RPE daily in rehab to express subjective intensity level;Long Term:  Able to use RPE to guide intensity level when exercising independently       Able to understand and use Dyspnea scale  Yes       Intervention  Provide education and explanation on how to use Dyspnea scale       Expected Outcomes  Short Term: Able to use Dyspnea scale  daily in rehab to express subjective sense of shortness of breath during exertion;Long Term: Able to use Dyspnea scale to guide intensity level when exercising independently       Knowledge and understanding of Target Heart Rate Range (THRR)  Yes       Intervention  Provide education and explanation of THRR including how the numbers were predicted and where they are located for reference       Expected Outcomes  Short Term: Able to state/look up THRR;Short Term: Able to use daily as guideline for intensity in rehab;Long Term: Able to use THRR to govern intensity when exercising independently       Able to check pulse independently  Yes       Intervention  Provide education and demonstration on how to check pulse in carotid and radial arteries.;Review the importance of being able to check your own pulse for safety during independent exercise       Expected Outcomes  Short Term: Able to explain why pulse checking is important during independent exercise;Long Term: Able to check pulse independently and accurately       Understanding of Exercise Prescription  Yes       Intervention  Provide education, explanation, and written materials on patient's individual exercise prescription       Expected Outcomes  Short Term: Able to explain program exercise prescription;Long Term: Able to explain home exercise prescription to exercise independently          Exercise Goals Re-Evaluation : Exercise Goals Re-Evaluation    Row Name 11/19/17 1016 11/23/17 1044 12/05/17 1224 12/19/17 1136       Exercise Goal Re-Evaluation   Exercise Goals Review  Increase Physical Activity;Increase Strength and Stamina;Able to understand and use rate of perceived exertion (RPE) scale;Knowledge and understanding of Target Heart Rate Range (THRR);Understanding of Exercise Prescription;Able to understand and use Dyspnea scale  Increase Physical Activity;Able to understand and use rate of perceived exertion (RPE) scale;Knowledge and  understanding of Target Heart Rate Range (THRR);Increase Strength and Stamina;Able to understand and use Dyspnea scale  Increase Physical Activity;Increase Strength and Stamina;Able to understand and use rate of perceived exertion (RPE) scale;Able to understand and use Dyspnea scale  Increase Physical Activity;Increase Strength and Stamina;Able to understand and use rate of perceived exertion (RPE) scale;Able to understand and use Dyspnea scale    Comments  Reviewed RPE scale, THR and program prescription with pt today.  Pt voiced understanding and was given a copy of goals to take home.   Reviewed home exercise with pt today.  Pt plans to go to [facility] for exercise.  Reviewed THR, pulse, RPE, sign and symptoms, NTG use, and when to call 911 or MD.  Also discussed weather considerations and indoor options.  Pt voiced understanding.  Raydan tolerates exercise well.  He walks consistently 34-37 laps around the track.  Dashun is graudally increasing laps walked and overall METs.  He will miss a couple weeks going to [place] for his [place].  Expected Outcomes  Short: Use RPE daily to regulate intensity. Long: Follow program prescription in THR.  Short - exercise 3 days per week between LW and home Long - exercise 3 days on his own   Short - continue to attend consistently, consider 4 lb for strength work Long - increase from current MET level  Short - exercise while on vacation Long - increase MET level       Discharge Exercise Prescription (Final Exercise Prescription Changes): Exercise Prescription Changes - 12/19/17 1100      Response to Exercise   Blood Pressure (Admit)  130/58    Blood Pressure (Exit)  122/66    Heart Rate (Admit)  80 bpm    Heart Rate (Exercise)  111 bpm    Heart Rate (Exit)  84 bpm    Oxygen Saturation (Admit)  94 %    Oxygen Saturation (Exercise)  96 %    Oxygen Saturation (Exit)  92 %    Rating of Perceived Exertion (Exercise)  12    Perceived Dyspnea (Exercise)  2     Symptoms  none    Duration  Continue with 45 min of aerobic exercise without signs/symptoms of physical distress.    Intensity  THRR unchanged      Progression   Progression  Continue to progress workloads to maintain intensity without signs/symptoms of physical distress.    Average METs  2.4      Resistance Training   Training Prescription  Yes    Weight  3 lb    Reps  10-15      Interval Training   Interval Training  No      T5 Nustep   Level  4    SPM  80    Minutes  15    METs  2      Track   Laps  39    Minutes  15    METs  2.9       Nutrition:  Target Goals: Understanding of nutrition guidelines, daily intake of sodium <1520m, cholesterol <2082m calories 30% from fat and 7% or less from saturated fats, daily to have 5 or more servings of fruits and vegetables.  Biometrics: Pre Biometrics - 11/13/17 1157      Pre Biometrics   Height  5' 10.5" (1.791 m)    Weight  197 lb 11.2 oz (89.7 kg)    Waist Circumference  42.5 inches    Hip Circumference  43 inches    Waist to Hip Ratio  0.99 %    BMI (Calculated)  27.96        Nutrition Therapy Plan and Nutrition Goals: Nutrition Therapy & Goals - 11/19/17 1043      Nutrition Therapy   RD appointment deferred  Yes   reports his wife to be monitoring his diet      Nutrition Assessments: Nutrition Assessments - 11/13/17 1100      MEDFICTS Scores   Pre Score  33       Nutrition Goals Re-Evaluation:   Nutrition Goals Discharge (Final Nutrition Goals Re-Evaluation):   Psychosocial: Target Goals: Acknowledge presence or absence of significant depression and/or stress, maximize coping skills, provide positive support system. Participant is able to verbalize types and ability to use techniques and skills needed for reducing stress and depression.   Initial Review & Psychosocial Screening: Initial Psych Review & Screening - 11/13/17 1117      Initial Review   Current issues with  None  Identified       Family Dynamics   Good Support System?  Yes    Comments  Wife and Kids are good support      Screening Interventions   Interventions  Encouraged to exercise;Program counselor consult;Provide feedback about the scores to participant;To provide support and resources with identified psychosocial needs    Expected Outcomes  Short Term goal: Utilizing psychosocial counselor, staff and physician to assist with identification of specific Stressors or current issues interfering with healing process. Setting desired goal for each stressor or current issue identified.;Long Term Goal: Stressors or current issues are controlled or eliminated.;Short Term goal: Identification and review with participant of any Quality of Life or Depression concerns found by scoring the questionnaire.;Long Term goal: The participant improves quality of Life and PHQ9 Scores as seen by post scores and/or verbalization of changes       Quality of Life Scores:  Scores of 19 and below usually indicate a poorer quality of life in these areas.  A difference of  2-3 points is a clinically meaningful difference.  A difference of 2-3 points in the total score of the Quality of Life Index has been associated with significant improvement in overall quality of life, self-image, physical symptoms, and general health in studies assessing change in quality of life.  PHQ-9: Recent Review Flowsheet Data    Depression screen [facility] 2/9 11/13/2017 04/10/2016 12/28/2015 11/23/2015   Decreased Interest 0 0 0 0   Down, Depressed, Hopeless 0 0 0 0   PHQ - 2 Score 0 0 0 0   Altered sleeping 0 0 - 0   Tired, decreased energy 0 0 - 0   Change in appetite 0 0 - 0   Feeling bad or failure about yourself  0 0 - 0   Trouble concentrating 0 0 - 0   Moving slowly or fidgety/restless 0 0 - 0   Suicidal thoughts 0 0 - 0   PHQ-9 Score 0 0 - 0   Difficult doing work/chores Not difficult at all - - -     Interpretation of Total Score  Total Score Depression  Severity:  1-4 = Minimal depression, 5-9 = Mild depression, 10-14 = Moderate depression, 15-19 = Moderately severe depression, 20-27 = Severe depression   Psychosocial Evaluation and Intervention: Psychosocial Evaluation - 11/19/17 1107      Psychosocial Evaluation & Interventions   Comments  Mr. Tith Dirocco)  has returned to this program after almost a year and counselor met with him for his initial psychosocial evaluation today. He is an 82 year old who has a strong support system with a spouse of 39 years and (2) adult children who live in other states.  Vince is also actively involved in his faith community.  He has multiple health issues with diabetes; high blood pressure and gout in addition to his pulmonary condition.  He reports sleeping "great" and having a good appetite.  He is typically in a positive mood and denies any history or current symptoms of depression or anxiety.  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  Nicolai's goals for this program are to be more consistent in exercise and increase his stamina and strength.  He plans to go to [place] with his spouse in October and would like to be more fit to enjoy this trip.  Staff will follow with Coreon throughout the course of this program.  Expected Outcomes  Short:  Ulisses will get back into a routine of exercise to become more consistent and to increase his stamina and strength.   Long:  Kenshin will make exercise a healthy lifestyle for himself.    Continue Psychosocial Services   Follow up required by staff       Psychosocial Re-Evaluation: Psychosocial Re-Evaluation    [place] Name 12/17/17 1639             Psychosocial Re-Evaluation   Current issues with  Current Stress Concerns       Comments  Patient states that the only stress he is is when he gets short of breath. He has issues with going up hills and inclines. His wife of 28 years is doing well and they have a great  marraige.       Expected Outcomes  Short: Attend [facility] stress management education to decrease stress. Long: Maintain exercise Post [facility] to keep stress at a minimum.       Interventions  Encouraged to attend Pulmonary Rehabilitation for the exercise       Continue Psychosocial Services   Follow up required by staff          Psychosocial Discharge (Final Psychosocial Re-Evaluation): Psychosocial Re-Evaluation - 12/17/17 1639      Psychosocial Re-Evaluation   Current issues with  Current Stress Concerns    Comments  Patient states that the only stress he is is when he gets short of breath. He has issues with going up hills and inclines. His wife of 68 years is doing well and they have a great marraige.    Expected Outcomes  Short: Attend [facility] stress management education to decrease stress. Long: Maintain exercise Post [facility] to keep stress at a minimum.    Interventions  Encouraged to attend Pulmonary Rehabilitation for the exercise    Continue Psychosocial Services   Follow up required by staff       Education: Education Goals: Education classes will be provided on a weekly basis, covering required topics. Participant will state understanding/return demonstration of topics presented.  Learning Barriers/Preferences: Learning Barriers/Preferences - 11/13/17 1104      Learning Barriers/Preferences   Learning Barriers  None    Learning Preferences  Group Instruction;Individual Instruction;Pictoral;Skilled Demonstration;Verbal Instruction;Video;Written Material       Education Topics:  Initial Evaluation Education: - Verbal, written and demonstration of respiratory meds, oximetry and breathing techniques. Instruction on use of nebulizers and MDIs and importance of monitoring MDI activations.   Pulmonary Rehab from 12/19/2017 in [facility] Cardiac and Pulmonary Rehab  Date  11/13/17  Educator  [facility]  Instruction Review Code  1- Verbalizes Understanding      General Nutrition  Guidelines/Fats and Fiber: -Group instruction provided by verbal, written material, models and posters to present the general guidelines for heart healthy nutrition. Gives an explanation and review of dietary fats and fiber.   Pulmonary Rehab from 12/19/2017 in [facility] Cardiac and Pulmonary Rehab  Date  11/21/17  Educator  [facility]  Instruction Review Code  5- Refused Teaching      Controlling Sodium/Reading Food Labels: -Group verbal and written material supporting the discussion of sodium use in heart healthy nutrition. Review and explanation with models, verbal and written materials for utilization of the food label.   Pulmonary Rehab from 12/19/2017 in [facility] Cardiac and Pulmonary Rehab  Date  11/28/17  Educator  [facility]  Instruction Review Code  1- Verbalizes Understanding      Exercise Physiology & General  Exercise Guidelines: - Group verbal and written instruction with models to review the exercise physiology of the cardiovascular system and associated critical values. Provides general exercise guidelines with specific guidelines to those with heart or lung disease.    Pulmonary Rehab from 12/19/2017 in [facility] Cardiac and Pulmonary Rehab  Date  12/05/17  Educator  [facility]  Instruction Review Code  5- Refused Teaching      Aerobic Exercise & Resistance Training: - Gives group verbal and written instruction on the various components of exercise. Focuses on aerobic and resistive training programs and the benefits of this training and how to safely progress through these programs.   Pulmonary Rehab from 12/19/2017 in [facility] Cardiac and Pulmonary Rehab  Date  12/19/17  Educator  [facility]  Instruction Review Code  1- Verbalizes Understanding      Flexibility, Balance, Mind/Body Relaxation: Provides group verbal/written instruction on the benefits of flexibility and balance training, including mind/body exercise modes such as yoga, pilates and tai chi.  Demonstration and skill practice provided.   Stress and  Anxiety: - Provides group verbal and written instruction about the health risks of elevated stress and causes of high stress.  Discuss the correlation between heart/lung disease and anxiety and treatment options. Review healthy ways to manage with stress and anxiety.   Depression: - Provides group verbal and written instruction on the correlation between heart/lung disease and depressed mood, treatment options, and the stigmas associated with seeking treatment.   Exercise & Equipment Safety: - Individual verbal instruction and demonstration of equipment use and safety with use of the equipment.   Pulmonary Rehab from 12/19/2017 in [facility] Cardiac and Pulmonary Rehab  Date  11/13/17  Educator  [facility]  Instruction Review Code  1- Verbalizes Understanding      Infection Prevention: - Provides verbal and written material to individual with discussion of infection control including proper hand washing and proper equipment cleaning during exercise session.   Pulmonary Rehab from 12/19/2017 in [facility] Cardiac and Pulmonary Rehab  Date  11/13/17  Educator  [facility]  Instruction Review Code  1- Verbalizes Understanding      Falls Prevention: - Provides verbal and written material to individual with discussion of falls prevention and safety.   Pulmonary Rehab from 12/19/2017 in [facility] Cardiac and Pulmonary Rehab  Date  11/13/17  Educator  [facility]  Instruction Review Code  1- Verbalizes Understanding      Diabetes: - Individual verbal and written instruction to review signs/symptoms of diabetes, desired ranges of glucose level fasting, after meals and with exercise. Advice that pre and post exercise glucose checks will be done for 3 sessions at entry of program.   Pulmonary Rehab from 04/19/2016 in [facility] Cardiac and Pulmonary Rehab  Date  11/29/15  Educator  [facility]  Instruction Review Code (retired)  2- meets goals/outcomes      Chronic Lung Diseases: - Group verbal and written instruction to review updates,  respiratory medications, advancements in procedures and treatments. Discuss use of supplemental oxygen including available portable oxygen systems, continuous and intermittent flow rates, concentrators, personal use and safety guidelines. Review proper use of inhaler and spacers. Provide informative websites for self-education.    Pulmonary Rehab from 12/19/2017 in [facility] Cardiac and Pulmonary Rehab  Date  11/23/17  Educator  [facility]  Instruction Review Code  1- Verbalizes Understanding      Energy Conservation: - Provide group verbal and written instruction for methods to conserve energy, plan and organize activities. Instruct on pacing techniques, use of  adaptive equipment and posture/positioning to relieve shortness of breath.   Triggers and Exacerbations: - Group verbal and written instruction to review types of environmental triggers and ways to prevent exacerbations. Discuss weather changes, air quality and the benefits of nasal washing. Review warning signs and symptoms to help prevent infections. Discuss techniques for effective airway clearance, coughing, and vibrations.   Pulmonary Rehab from 12/19/2017 in [facility] Cardiac and Pulmonary Rehab  Date  12/12/17  Educator  [facility]  Instruction Review Code  1- Verbalizes Understanding      AED/CPR: - Group verbal and written instruction with the use of models to demonstrate the basic use of the AED with the basic ABC's of resuscitation.   Pulmonary Rehab from 04/19/2016 in [facility] Cardiac and Pulmonary Rehab  Date  01/21/16  Educator  CE  Instruction Review Code (retired)  2- Lawyer and Physiology of the Lungs: - Group verbal and written instruction with the use of models to provide basic lung anatomy and physiology related to function, structure and complications of lung disease.   Anatomy & Physiology of the Heart: - Group verbal and written instruction and models provide basic cardiac anatomy and physiology, with the  coronary electrical and arterial systems. Review of Valvular disease and Heart Failure   Cardiac Medications: - Group verbal and written instruction to review commonly prescribed medications for heart disease. Reviews the medication, class of the drug, and side effects.   Pulmonary Rehab from 04/19/2016 in [facility] Cardiac and Pulmonary Rehab  Date  12/24/15  Educator  CE  Instruction Review Code (retired)  2- meets goals/outcomes      Know Your Numbers and Risk Factors: -Group verbal and written instruction about important numbers in your health.  Discussion of what are risk factors and how they play a role in the disease process.  Review of Cholesterol, Blood Pressure, Diabetes, and BMI and the role they play in your overall health.   Pulmonary Rehab from 12/19/2017 in [facility] Cardiac and Pulmonary Rehab  Date  12/07/17  Educator  [facility]  Instruction Review Code  1- Verbalizes Understanding      Sleep Hygiene: -Provides group verbal and written instruction about how sleep can affect your health.  Define sleep hygiene, discuss sleep cycles and impact of sleep habits. Review good sleep hygiene tips.    Other: -Provides group and verbal instruction on various topics (see comments)    Knowledge Questionnaire Score: Knowledge Questionnaire Score - 11/13/17 1113      Knowledge Questionnaire Score   Pre Score  15/18   Reviewed with patient       Core Components/Risk Factors/Patient Goals at Admission: Personal Goals and Risk Factors at Admission - 11/13/17 1119      Core Components/Risk Factors/Patient Goals on Admission    Weight Management  Yes;Weight Maintenance    Intervention  Weight Management/Obesity: Establish reasonable short term and long term weight goals.;Weight Management: Provide education and appropriate resources to help participant work on and attain dietary goals.;Weight Management: Develop a combined nutrition and exercise program designed to reach desired caloric intake,  while maintaining appropriate intake of nutrient and fiber, sodium and fats, and appropriate energy expenditure required for the weight goal.    Admit Weight  197 lb 11.2 oz (89.7 kg)    Goal Weight: Short Term  195 lb (88.5 kg)    Goal Weight: Long Term  195 lb (88.5 kg)    Expected Outcomes  Short Term: Continue to  assess and modify interventions until short term weight is achieved;Long Term: Adherence to nutrition and physical activity/exercise program aimed toward attainment of established weight goal;Weight Maintenance: Understanding of the daily nutrition guidelines, which includes 25-35% calories from fat, 7% or less cal from saturated fats, less than 23m cholesterol, less than 1.5gm of sodium, & 5 or more servings of fruits and vegetables daily;Understanding recommendations for meals to include 15-35% energy as protein, 25-35% energy from fat, 35-60% energy from carbohydrates, less than 2029mof dietary cholesterol, 20-35 gm of total fiber daily;Understanding of distribution of calorie intake throughout the day with the consumption of 4-5 meals/snacks    Improve shortness of breath with ADL's  Yes    Intervention  Provide education, individualized exercise plan and daily activity instruction to help decrease symptoms of SOB with activities of daily living.    Expected Outcomes  Short Term: Improve cardiorespiratory fitness to achieve a reduction of symptoms when performing ADLs;Long Term: Be able to perform more ADLs without symptoms or delay the onset of symptoms    Diabetes  Yes    Intervention  Provide education about signs/symptoms and action to take for hypo/hyperglycemia.;Provide education about proper nutrition, including hydration, and aerobic/resistive exercise prescription along with prescribed medications to achieve blood glucose in normal ranges: Fasting glucose 65-99 mg/dL    Expected Outcomes  Short Term: Participant verbalizes understanding of the signs/symptoms and immediate care  of hyper/hypoglycemia, proper foot care and importance of medication, aerobic/resistive exercise and nutrition plan for blood glucose control.;Long Term: Attainment of HbA1C < 7%.    Hypertension  Yes    Intervention  Provide education on lifestyle modifcations including regular physical activity/exercise, weight management, moderate sodium restriction and increased consumption of fresh fruit, vegetables, and low fat dairy, alcohol moderation, and smoking cessation.;Monitor prescription use compliance.    Expected Outcomes  Short Term: Continued assessment and intervention until BP is < 140/9052mG in hypertensive participants. < 130/49m44m in hypertensive participants with diabetes, heart failure or chronic kidney disease.;Long Term: Maintenance of blood pressure at goal levels.    Lipids  Yes    Intervention  Provide education and support for participant on nutrition & aerobic/resistive exercise along with prescribed medications to achieve LDL <70mg67mL >40mg.38mExpected Outcomes  Short Term: Participant states understanding of desired cholesterol values and is compliant with medications prescribed. Participant is following exercise prescription and nutrition guidelines.;Long Term: Cholesterol controlled with medications as prescribed, with individualized exercise RX and with personalized nutrition plan. Value goals: LDL < 70mg, 30m> 40 mg.       Core Components/Risk Factors/Patient Goals Review:  Goals and Risk Factor Review    Row Name 12/17/17 1647             Core Components/Risk Factors/Patient Goals Review   Personal Goals Review  Weight Management/Obesity;Improve shortness of breath with ADL's;Lipids;Hypertension;Diabetes       Review  Patient is doing well in [facility] willing to work hard. His blood sugar has been within normal limits. He wants to "keep moving" as he gets older and wants to be able to get around the house and do things with his wife. His blood pressure has  been under control with it being 130s/70s.       Expected Outcomes  Short: Attend [facility] regularly to improve shortness of breath with ADL's. Long: maintain independence with ADL's           Core Components/Risk Factors/Patient Goals at Discharge (Final  Review):  Goals and Risk Factor Review - 12/17/17 1647      Core Components/Risk Factors/Patient Goals Review   Personal Goals Review  Weight Management/Obesity;Improve shortness of breath with ADL's;Lipids;Hypertension;Diabetes    Review  Patient is doing well in [place] and is willing to work hard. His blood sugar has been within normal limits. He wants to "keep moving" as he gets older and wants to be able to get around the house and do things with his wife. His blood pressure has been under control with it being 130s/70s.    Expected Outcomes  Short: Attend [facility] regularly to improve shortness of breath with ADL's. Long: maintain independence with ADL's        ITP Comments: ITP Comments    Row Name 11/13/17 1042 12/03/17 0820 12/31/17 0824       ITP Comments  Medical Evaluation completed. Chart sent for review and changes to Dr. Emily Filbert Director of [place]. Diagnosis can be found in [facility] encounter 09/26/17  30 day review completed. ITP sent to Dr. Emily Filbert Director of [place]. Continue with ITP unless changes are made by physician  30 day review completed. ITP sent to Dr. Emily Filbert Director of [place]. Continue with ITP unless changes are made by physician.        Comments: 30 day review

## 2018-01-03 ENCOUNTER — Other Ambulatory Visit: Payer: Self-pay | Admitting: Family Medicine

## 2018-01-07 ENCOUNTER — Encounter: Payer: Medicare Other | Admitting: *Deleted

## 2018-01-07 DIAGNOSIS — J849 Interstitial pulmonary disease, unspecified: Secondary | ICD-10-CM | POA: Diagnosis not present

## 2018-01-07 DIAGNOSIS — J8410 Pulmonary fibrosis, unspecified: Secondary | ICD-10-CM

## 2018-01-07 DIAGNOSIS — J84112 Idiopathic pulmonary fibrosis: Secondary | ICD-10-CM | POA: Diagnosis not present

## 2018-01-07 NOTE — Progress Notes (Signed)
Daily Session Note  Patient Details  Name: Jeremiah Gibson MRN: [id number] Date of Birth: 25-Oct-1935 Referring Provider:     Pulmonary Rehab from 11/13/2017 in [facility] Cardiac and Pulmonary Rehab  Referring Provider  Alba      Encounter Date: 01/07/2018  Check In: Session Check In - 01/07/18 1011      Check-In   Supervising physician immediately available to respond to emergencies  [facility] immediately available ER MD    Physician(s)  Jimmye Norman and [facility]    Location  [facility]-Cardiac & Pulmonary Rehab    Staff Present  Earlean Shawl, BS, ACSM CEP, Exercise Physiologist;Joseph Darrin Nipper, [place], RCEP, CCRP, Exercise Physiologist    Medication changes reported      No    Fall or balance concerns reported     No    Tobacco Cessation  No Change    Warm-up and Cool-down  Performed as group-led instruction    Resistance Training Performed  Yes    VAD Patient?  No    PAD/SET Patient?  No      Pain Assessment   Currently in Pain?  No/denies    Multiple Pain Sites  No          Social History   Tobacco Use  Smoking Status Former Smoker  . Packs/day: 2.00  . Years: 20.00  . Pack years: 40.00  . Types: Cigarettes, Pipe  . Last attempt to quit: 11/22/1999  . Years since quitting: 18.1  Smokeless Tobacco Never Used  Tobacco Comment   quit cigs in 72 and pipe in 01    Goals Met:  Proper associated with RPD/PD & O2 Sat Independence with exercise equipment Exercise tolerated well No report of cardiac concerns or symptoms Strength training completed today  Goals Unmet:  Not Applicable  Comments: Pt able to follow exercise prescription today without complaint.  Will continue to monitor for progression.    Dr. Emily Filbert is Medical Director for [place] and [facility].

## 2018-01-09 DIAGNOSIS — J84112 Idiopathic pulmonary fibrosis: Secondary | ICD-10-CM | POA: Diagnosis not present

## 2018-01-09 DIAGNOSIS — J8410 Pulmonary fibrosis, unspecified: Secondary | ICD-10-CM

## 2018-01-09 DIAGNOSIS — J849 Interstitial pulmonary disease, unspecified: Secondary | ICD-10-CM

## 2018-01-09 NOTE — Progress Notes (Signed)
Daily Session Note  Patient Details  Name: Jeremiah Gibson MRN: [id number] Date of Birth: [birth year] Referring Provider:     Pulmonary Rehab from 11/13/2017 in [facility] Cardiac and Pulmonary Rehab  Referring Provider  [facility]      Encounter Date: 01/09/2018  Check In: Session Check In - 01/09/18 1136      Check-In   Supervising physician immediately available to respond to emergencies  [facility] immediately available ER MD    Physician(s)  Reita Cliche and Quentin Cornwall    Location  [facility]-Cardiac & Pulmonary Rehab    Staff Present  Alberteen Sam, MA, RCEP, CCRP, Exercise Physiologist;Joseph Foy [place], [place], ACSM CEP, Exercise Physiologist    Medication changes reported      No    Fall or balance concerns reported     No    Warm-up and Cool-down  Performed as group-led instruction    Resistance Training Performed  Yes    VAD Patient?  No    PAD/SET Patient?  No      Pain Assessment   Currently in Pain?  No/denies    Multiple Pain Sites  No          Social History   Tobacco Use  Smoking Status Former Smoker  . Packs/day: 2.00  . Years: 20.00  . Pack years: 40.00  . Types: Cigarettes, Pipe  . Last attempt to quit: 11/22/1999  . Years since quitting: 18.1  Smokeless Tobacco Never Used  Tobacco Comment   quit cigs in 72 and pipe in 01    Goals Met:  Proper associated with RPD/PD & O2 Sat Independence with exercise equipment Exercise tolerated well Strength training completed today  Goals Unmet:  Not Applicable  Comments: Pt able to follow exercise prescription today without complaint.  Will continue to monitor for progression.    Dr. Emily Filbert is Medical Director for [place] and [facility]monary [facility].

## 2018-01-11 ENCOUNTER — Encounter: Payer: Medicare Other | Attending: Pulmonary Disease

## 2018-01-11 DIAGNOSIS — J84112 Idiopathic pulmonary fibrosis: Secondary | ICD-10-CM | POA: Diagnosis not present

## 2018-01-11 DIAGNOSIS — J849 Interstitial pulmonary disease, unspecified: Secondary | ICD-10-CM | POA: Insufficient documentation

## 2018-01-11 NOTE — Progress Notes (Signed)
Daily Session Note  Patient Details  Name: Jeremiah Gibson MRN: [id number] Date of Birth: 25-May-1935 Referring Provider:     Pulmonary Rehab from 11/13/2017 in [facility] Cardiac and Pulmonary Rehab  Referring Provider  Alba      Encounter Date: 01/11/2018  Check In: Session Check In - 01/11/18 1003      Check-In   Supervising physician immediately available to respond to emergencies  [facility] immediately available ER MD    Physician(s)  Dr. Cherylann Banas    Location  [facility]-Cardiac & Pulmonary Rehab    Staff Present  Justin Mend Lorre Nick, MA, RCEP, CCRP, Exercise Physiologist;Meredith Sherryll Burger, RN BSN    Medication changes reported      No    Fall or balance concerns reported     No    Warm-up and Cool-down  Performed as group-led Higher education careers adviser Performed  Yes    VAD Patient?  No    PAD/SET Patient?  No      Pain Assessment   Currently in Pain?  No/denies          Social History   Tobacco Use  Smoking Status Former Smoker  . Packs/day: 2.00  . Years: 20.00  . Pack years: 40.00  . Types: Cigarettes, Pipe  . Last attempt to quit: 11/22/1999  . Years since quitting: 18.1  Smokeless Tobacco Never Used  Tobacco Comment   quit cigs in 72 and pipe in 01    Goals Met:  Independence with exercise equipment Exercise tolerated well No report of cardiac concerns or symptoms Strength training completed today  Goals Unmet:  Not Applicable  Comments: Pt able to follow exercise prescription today without complaint.  Will continue to monitor for progression.    Dr. Emily Filbert is Medical Director for [place] and [facility]monary [facility].

## 2018-01-12 ENCOUNTER — Other Ambulatory Visit: Payer: Self-pay | Admitting: Family Medicine

## 2018-01-12 DIAGNOSIS — E1121 Type 2 diabetes mellitus with diabetic nephropathy: Secondary | ICD-10-CM

## 2018-01-16 DIAGNOSIS — J84112 Idiopathic pulmonary fibrosis: Secondary | ICD-10-CM | POA: Diagnosis not present

## 2018-01-16 DIAGNOSIS — J8410 Pulmonary fibrosis, unspecified: Secondary | ICD-10-CM

## 2018-01-16 DIAGNOSIS — J849 Interstitial pulmonary disease, unspecified: Secondary | ICD-10-CM

## 2018-01-16 NOTE — Progress Notes (Signed)
Daily Session Note  Patient Details  Name: Marshawn Normoyle MRN: [id number] Date of Birth: 1935-12-09 Referring Provider:     Pulmonary Rehab from 11/13/2017 in [facility] Cardiac and Pulmonary Rehab  Referring Provider  Alba      Encounter Date: 01/16/2018  Check In: Session Check In - 01/16/18 1017      Check-In   Supervising physician immediately available to respond to emergencies  [facility] immediately available ER MD    Physician(s)  Mariea Clonts and Alfred Levins    Location  [facility]-Cardiac & Pulmonary Rehab    Staff Present  Alberteen Sam, MA, RCEP, CCRP, Exercise Physiologist;Joseph Foy [place], [place], ACSM CEP, Exercise Physiologist    Medication changes reported      No    Fall or balance concerns reported     No    Warm-up and Cool-down  Performed as group-led instruction    Resistance Training Performed  Yes    VAD Patient?  No    PAD/SET Patient?  No      Pain Assessment   Currently in Pain?  No/denies    Multiple Pain Sites  No          Social History   Tobacco Use  Smoking Status Former Smoker  . Packs/day: 2.00  . Years: 20.00  . Pack years: 40.00  . Types: Cigarettes, Pipe  . Last attempt to quit: 11/22/1999  . Years since quitting: 18.1  Smokeless Tobacco Never Used  Tobacco Comment   quit cigs in 72 and pipe in 01    Goals Met:  Proper associated with RPD/PD & O2 Sat Independence with exercise equipment Exercise tolerated well Strength training completed today  Goals Unmet:  Not Applicable  Comments: Pt able to follow exercise prescription today without complaint.  Will continue to monitor for progression.    Dr. Emily Filbert is Medical Director for [place] and [facility].

## 2018-01-18 DIAGNOSIS — J849 Interstitial pulmonary disease, unspecified: Secondary | ICD-10-CM

## 2018-01-18 DIAGNOSIS — J84112 Idiopathic pulmonary fibrosis: Secondary | ICD-10-CM | POA: Diagnosis not present

## 2018-01-18 NOTE — Progress Notes (Signed)
Daily Session Note  Patient Details  Name: Jeremiah Gibson MRN: [id number] Date of Birth: [birth year] Referring Provider:     Pulmonary Rehab from 11/13/2017 in [facility] Cardiac and Pulmonary Rehab  Referring Provider  Alba      Encounter Date: 01/18/2018  Check In: Session Check In - 01/18/18 0956      Check-In   Supervising physician immediately available to respond to emergencies  [facility] immediately available ER MD    Physician(s)  Dr. Cinda Quest and Corky Downs    Location  [facility]Cardiac [facility] Pulmonary Rehab    Staff Present  Renita Papa, RN BSN;Darianny Momon RCP,RRT,BSRT;Amanda Oletta Darter, [place], ACSM CEP, Exercise Physiologist    Medication changes reported      No    Fall or balance concerns reported     No    Warm-up and Cool-down  Performed as group-led instruction    Resistance Training Performed  Yes    VAD Patient?  No    PAD/SET Patient?  No      Pain Assessment   Currently in Pain?  No/denies          Social History   Tobacco Use  Smoking Status Former Smoker  . Packs/day: 2.00  . Years: 20.00  . Pack years: 40.00  . Types: Cigarettes, Pipe  . Last attempt to quit: 11/22/1999  . Years since quitting: 18.1  Smokeless Tobacco Never Used  Tobacco Comment   quit cigs in 72 and pipe in 01    Goals Met:  Independence with exercise equipment Exercise tolerated well No report of cardiac concerns or symptoms Strength training completed today  Goals Unmet:  Not Applicable  Comments: Pt able to follow exercise prescription today without complaint.  Will continue to monitor for progression.    Dr. Emily Filbert is Medical Director for [place] and [facility].

## 2018-01-22 ENCOUNTER — Ambulatory Visit: Payer: Medicare Other | Admitting: Pulmonary Disease

## 2018-01-22 ENCOUNTER — Other Ambulatory Visit (INDEPENDENT_AMBULATORY_CARE_PROVIDER_SITE_OTHER): Payer: Medicare Other

## 2018-01-22 ENCOUNTER — Other Ambulatory Visit: Payer: Self-pay | Admitting: Family Medicine

## 2018-01-22 ENCOUNTER — Encounter: Payer: Self-pay | Admitting: Pulmonary Disease

## 2018-01-22 VITALS — BP 140/82 | HR 69 | Ht 71.0 in | Wt 198.2 lb

## 2018-01-22 DIAGNOSIS — E1121 Type 2 diabetes mellitus with diabetic nephropathy: Secondary | ICD-10-CM

## 2018-01-22 DIAGNOSIS — J84112 Idiopathic pulmonary fibrosis: Secondary | ICD-10-CM

## 2018-01-22 LAB — HEPATIC FUNCTION PANEL
ALK PHOS: 77 U/L (ref 39–117)
ALT: 16 U/L (ref 0–53)
AST: 19 U/L (ref 0–37)
Albumin: 4.2 g/dL (ref 3.5–5.2)
BILIRUBIN DIRECT: 0.1 mg/dL (ref 0.0–0.3)
Total Bilirubin: 0.5 mg/dL (ref 0.2–1.2)
Total Protein: 7.4 g/dL (ref 6.0–8.3)

## 2018-01-22 MED ORDER — FLUTICASONE-SALMETEROL 250-50 MCG/DOSE IN AEPB: 1.0000 | INHALATION_SPRAY | Freq: Two times a day (BID) | RESPIRATORY_TRACT | 5 refills | Status: AC

## 2018-01-22 NOTE — Assessment & Plan Note (Addendum)
Check LFTs today.  Lung function slightly lower today. Increase Ofev to 150 twice daily -we will send in prescription.  Call us back if diarrhea worsens.   Follow-up in 3 months after  PFTs - spiro + DLCO

## 2018-01-22 NOTE — Progress Notes (Signed)
   Subjective:    Patient ID: Jeremiah Gibson, male    DOB: 11-06-1935, 82 y.o.   MRN: [id number]  HPI  82 y.o remote ex smoker for FU of cough variant asthma &ILD  He was evaluated by Pulmonology in [place] in 2008 for cough &wheezing &diagnosed with asthma. Review of his spirometry records essentially show mild restriction with FEV1 in the 72% range and normal FEV1 ratio. However spirometry from 08/2006 does show significant bronchodilator response which could be consistent with some reversible disease. On this basis, he is maintained on a regimen of Advair 250/50 twice a day and albuterol as needed for rescue . Allergy testing was negative.   He remains on reduced dose of ofev 100 twice daily.  He complains of diarrhea especially in the afternoons, he is also on metformin thousand milligrams twice daily. LFTs last checked 11/2017 were normal, he is also on pravastatin 40 mg.  Overall breathing is stable, he is undergoing pulmonary rehab.  He was able to go to [place] on a two-week vacation for his 57th anniversary and walk there  He reports to me that ILD is being considered as a sequelae of [facility]  Dust exposure On his last visit 03/2017 FVC was noted to drop slightly from 89% to 81% Spirometry today shows a further drop in FVC 73% on his best effort   Significant tests/ events  2013 HRCT >>Mild subpleural reticulation/fibrosis, nonspecific. No definite honeycombing . Mild bilateral lower lobe bronchiectasis   HRCT 07/2015 >> UIP pattern, worse  RAST -neg   PFTs 09/2011 - no airway obstruction, nml lung volumes, DLCO 64%   PFTs 11/2012 -slight drop in FVC from 3.8 to 3.5, DLCO increased to 17.9 (57%), TLC dropped to 4.95(76%)  PFT 07/2015 FVC 86%, TLC 67%, DLCO 57% (stable)  PFT 09/2016 FVC 87%, DLCO 59%-stable Spirometry 03/2017 shows FVC of 81%  Past Medical History:  Diagnosis Date  . Anemia   . Arthritis   . Asthma   . Colon polyps   . Cough    CHRONIC  . Diabetes  mellitus without complication (HCC)   . Diverticulosis of colon (without mention of hemorrhage)   . Dyspnea    pulmonary fibrosis  . Dysrhythmia   . Esophageal reflux   . Gout   . Hyperlipidemia   . Hypertension   . Impotence of organic origin   . Leg cramps   . Pulmonary fibrosis (HCC)   . Pulmonary fibrosis (HCC)   . Wheezing      Review of Systems neg for any significant sore throat, dysphagia, itching, sneezing, nasal congestion or excess/ purulent secretions, fever, chills, sweats, unintended wt loss, pleuritic or exertional cp, hempoptysis, orthopnea pnd or change in chronic leg swelling. Also denies presyncope, palpitations, heartburn, abdominal pain, nausea, vomiting, diarrhea or change in bowel or urinary habits, dysuria,hematuria, rash, arthralgias, visual complaints, headache, numbness weakness or ataxia.     Objective:   Physical Exam  Gen. Pleasant, well-nourished, in no distress ENT - no thrush, no post nasal drip Neck: No JVD, no thyromegaly, no carotid bruits Lungs: no use of accessory muscles, no dullness to percussion, bibasAL rales no rhonchi  Cardiovascular: Rhythm regular, heart sounds  normal, no murmurs or gallops, no peripheral edema Musculoskeletal: No deformities, no cyanosis or clubbing        Assessment & Plan:

## 2018-01-22 NOTE — Telephone Encounter (Signed)
[facility]-  Sending a faxed request for refills of: Fluticasone-Salmeterol (ADVAIR DISKUS) 250-50 MCG/DOSE AEPB  Thanks, [facility]

## 2018-01-22 NOTE — Assessment & Plan Note (Signed)
Discuss with PCP about decreasing metformin to 1500/day since this may be contributing to diarrhea

## 2018-01-22 NOTE — Patient Instructions (Addendum)
Check LFTs today.  Lung function slightly lower today. Increase Ofev to 150 twice daily -we will send in prescription.  Call us back if diarrhea worsens. Discussed with PCP about decreasing metformin to 1500/day  Follow-up in 3 months after  PFTs - spiro + DLCO

## 2018-01-23 DIAGNOSIS — J84112 Idiopathic pulmonary fibrosis: Secondary | ICD-10-CM | POA: Diagnosis not present

## 2018-01-23 DIAGNOSIS — J849 Interstitial pulmonary disease, unspecified: Secondary | ICD-10-CM | POA: Diagnosis not present

## 2018-01-23 DIAGNOSIS — J8410 Pulmonary fibrosis, unspecified: Secondary | ICD-10-CM

## 2018-01-23 NOTE — Progress Notes (Signed)
Daily Session Note  Patient Details  Name: Jeremiah Gibson MRN: [id number] Date of Birth: 1935-09-26 Referring Provider:     Pulmonary Rehab from 11/13/2017 in [facility] Cardiac and Pulmonary Rehab  Referring Provider  Alba      Encounter Date: 01/23/2018  Check In: Session Check In - 01/23/18 1014      Check-In   Supervising physician immediately available to respond to emergencies  [facility] immediately available ER MD    Physician(s)  Reita Cliche and Darl Householder    Location  [facility]iac [facility] Pulmonary Rehab    Staff Present  Alberteen Sam, MA, RCEP, CCRP, Exercise Physiologist;Joseph Foy [place], [place], ACSM CEP, Exercise Physiologist    Medication changes reported      No    Fall or balance concerns reported     No    Warm-up and Cool-down  Performed as group-led instruction    Resistance Training Performed  Yes    VAD Patient?  No    PAD/SET Patient?  No      Pain Assessment   Currently in Pain?  No/denies    Multiple Pain Sites  No          Social History   Tobacco Use  Smoking Status Former Smoker  . Packs/day: 2.00  . Years: 20.00  . Pack years: 40.00  . Types: Cigarettes, Pipe  . Last attempt to quit: 11/22/1999  . Years since quitting: 18.1  Smokeless Tobacco Never Used  Tobacco Comment   quit cigs in 72 and pipe in 01    Goals Met:  Proper associated with RPD/PD & O2 Sat Independence with exercise equipment Exercise tolerated well Strength training completed today  Goals Unmet:  Not Applicable  Comments: Pt able to follow exercise prescription today without complaint.  Will continue to monitor for progression.    Dr. Emily Filbert is Medical Director for [place] and [facility].

## 2018-01-25 DIAGNOSIS — J849 Interstitial pulmonary disease, unspecified: Secondary | ICD-10-CM

## 2018-01-25 DIAGNOSIS — J84112 Idiopathic pulmonary fibrosis: Secondary | ICD-10-CM | POA: Diagnosis not present

## 2018-01-25 NOTE — Progress Notes (Signed)
Daily Session Note  Patient Details  Name: Jeremiah Gibson MRN: [id number] Date of Birth: 1935-10-16 Referring Provider:     Pulmonary Rehab from 11/13/2017 in [facility] Cardiac and Pulmonary Rehab  Referring Provider  Alba      Encounter Date: 01/25/2018  Check In: Session Check In - 01/25/18 [place]      Check-In   Supervising physician immediately available to respond to emergencies  [facility] immediately available ER MD    Physician(s)  Dr. Reita Cliche and [facility]    Location  [facility] Pul[facility] Re[facility]    Staff Present  Justin Mend RCP,RRT,BSRT;Amanda Oletta Darter, BA, ACSM CEP, Exercise Physiologist;Meredith Sherryll Burger, RN BSN    Medication changes reported      No    Fall or balance concerns reported     No    Warm-up and Cool-down  Performed as group-led Higher education careers adviser Performed  Yes    VAD Patient?  No    PAD/SET Patient?  No      Pain Assessment   Currently in Pain?  No/denies          Social History   Tobacco Use  Smoking Status Former Smoker  . Packs/day: 2.00  . Years: 20.00  . Pack years: 40.00  . Types: Cigarettes, Pipe  . Last attempt to quit: 11/22/1999  . Years since quitting: 18.1  Smokeless Tobacco Never Used  Tobacco Comment   quit cigs in 72 and pipe in 01    Goals Met:  Independence with exercise equipment Exercise tolerated well No report of cardiac concerns or symptoms Strength training completed today  Goals Unmet:  Not Applicable  Comments: Pt able to follow exercise prescription today without complaint.  Will continue to monitor for progression.    Dr. Emily Filbert is Medical Director for [place] and [facility].

## 2018-01-28 ENCOUNTER — Encounter: Payer: Medicare Other | Admitting: *Deleted

## 2018-01-28 DIAGNOSIS — J849 Interstitial pulmonary disease, unspecified: Secondary | ICD-10-CM

## 2018-01-28 DIAGNOSIS — J84112 Idiopathic pulmonary fibrosis: Secondary | ICD-10-CM | POA: Diagnosis not present

## 2018-01-28 DIAGNOSIS — J8410 Pulmonary fibrosis, unspecified: Secondary | ICD-10-CM

## 2018-01-28 NOTE — Progress Notes (Signed)
Daily Session Note  Patient Details  Name: Jeremiah Gibson MRN: [id number] Date of Birth: [birth year] Referring Provider:     Pulmonary Rehab from 11/13/2017 in [facility] Cardiac and Pulmonary Rehab  Referring Provider  Alba      Encounter Date: 01/28/2018  Check In: Session Check In - 01/28/18 1008      Check-In   Supervising physician immediately available to respond to emergencies  [facility] immediately available ER MD    Physician(s)  Dr. Mariea Clonts and Dr. Cherylann Banas    Location  [facility]iac [facility] Pulmonary Re[facility]    Staff Present  Justin Mend RCP,RRT,BSRT;Amanda Oletta Darter, [place], ACSM CEP, Exercise Physiologist;Jenisa Monty Amedeo Plenty, BS, ACSM CEP, Exercise Physiologist    Medication changes reported      No    Fall or balance concerns reported     No    Tobacco Cessation  No Change    Warm-up and Cool-down  Performed as group-led instruction    Resistance Training Performed  Yes    VAD Patient?  No    PAD/SET Patient?  No      Pain Assessment   Currently in Pain?  No/denies    Multiple Pain Sites  No          Social History   Tobacco Use  Smoking Status Former Smoker  . Packs/day: 2.00  . Years: 20.00  . Pack years: 40.00  . Types: Cigarettes, Pipe  . Last attempt to quit: 11/22/1999  . Years since quitting: 18.1  Smokeless Tobacco Never Used  Tobacco Comment   quit cigs in 72 and pipe in 01    Goals Met:  Proper associated with RPD/PD & O2 Sat Independence with exercise equipment Exercise tolerated well No report of cardiac concerns or symptoms Strength training completed today  Goals Unmet:  Not Applicable  Comments: Pt able to follow exercise prescription today without complaint.  Will continue to monitor for progression.    Dr. Emily Filbert is Medical Director for [place] and [facility].

## 2018-01-30 ENCOUNTER — Encounter: Payer: Medicare Other | Admitting: *Deleted

## 2018-01-30 DIAGNOSIS — J8410 Pulmonary fibrosis, unspecified: Secondary | ICD-10-CM

## 2018-01-30 DIAGNOSIS — J849 Interstitial pulmonary disease, unspecified: Secondary | ICD-10-CM

## 2018-01-30 DIAGNOSIS — J84112 Idiopathic pulmonary fibrosis: Secondary | ICD-10-CM | POA: Diagnosis not present

## 2018-01-30 NOTE — Progress Notes (Signed)
Daily Session Note  Patient Details  Name: Alpheus Stiff MRN: [id number] Date of Birth: 04-17-1935 Referring Provider:     Pulmonary Rehab from 11/13/2017 in [facility] Cardiac and Pulmonary Rehab  Referring Provider  Alba      Encounter Date: 01/30/2018  Check In: Session Check In - 01/30/18 1011      Check-In   Supervising physician immediately available to respond to emergencies  [facility] immediately available ER MD    Physician(s)  Drs. Malinda and [facility]    Location  [facility]Cardiac & Pulmonary Rehab    Staff Present  Alberteen Sam, MA, RCEP, CCRP, Exercise Physiologist;Joseph [employer], [place], ACSM CEP, Exercise Physiologist    Medication changes reported      No    Fall or balance concerns reported     No    Warm-up and Cool-down  Performed as group-led instruction    Resistance Training Performed  Yes    VAD Patient?  No    PAD/SET Patient?  No      Pain Assessment   Currently in Pain?  No/denies          Social History   Tobacco Use  Smoking Status Former Smoker  . Packs/day: 2.00  . Years: 20.00  . Pack years: 40.00  . Types: Cigarettes, Pipe  . Last attempt to quit: 11/22/1999  . Years since quitting: 18.2  Smokeless Tobacco Never Used  Tobacco Comment   quit cigs in 72 and pipe in 01    Goals Met:  Proper associated with RPD/PD & O2 Sat Independence with exercise equipment Using PLB without cueing & demonstrates good technique Exercise tolerated well No report of cardiac concerns or symptoms Strength training completed today  Goals Unmet:  Not Applicable  Comments: Pt able to follow exercise prescription today without complaint.  Will continue to monitor for progression.    Dr. Emily Filbert is Medical Director for [place] and [facility].

## 2018-01-31 ENCOUNTER — Telehealth: Payer: Self-pay | Admitting: Pulmonary Disease

## 2018-01-31 DIAGNOSIS — J4530 Mild persistent asthma, uncomplicated: Secondary | ICD-10-CM

## 2018-01-31 DIAGNOSIS — J849 Interstitial pulmonary disease, unspecified: Secondary | ICD-10-CM

## 2018-01-31 MED ORDER — NINTEDANIB ESYLATE 100 MG PO CAPS: 150.0000 mg | ORAL_CAPSULE | Freq: Two times a day (BID) | ORAL | 1 refills | Status: DC

## 2018-01-31 NOTE — Telephone Encounter (Signed)
Called and spoke with patient, he stated that he needed a refill of OFEV but he was under the impression that RA was to make changes. Looked back at patients AVS and RA wanted to increase the medication to 150 mg BID. Called and [facility] and called in the refill of OFEV. Patient is aware. Nothing further needed.

## 2018-02-04 DIAGNOSIS — J849 Interstitial pulmonary disease, unspecified: Secondary | ICD-10-CM | POA: Diagnosis not present

## 2018-02-04 DIAGNOSIS — J84112 Idiopathic pulmonary fibrosis: Secondary | ICD-10-CM | POA: Diagnosis not present

## 2018-02-04 NOTE — Progress Notes (Signed)
Daily Session Note  Patient Details  Name: Jeremiah Gibson MRN: [id number] Date of Birth: 11/10/1935 Referring Provider:     Pulmonary Rehab from 11/13/2017 in [facility] Cardiac and Pulmonary Rehab  Referring Provider  Alba      Encounter Date: 02/04/2018  Check In: Session Check In - 02/04/18 1411      Check-In   Supervising physician immediately available to respond to emergencies  [facility] immediately available ER MD    Physician(s)  Dr. Joni Fears and Jimmye Norman    Location  [facility] Pul[facility] Re[facility]    Staff Present  Justin Mend RCP,RRT,BSRT;Amanda Oletta [place], [place], ACSM CEP, Exercise Physiologist;Kelly Amedeo Plenty, BS, ACSM CEP, Exercise Physiologist    Medication changes reported      No    Fall or balance concerns reported     No    Warm-up and Cool-down  Performed as group-led instruction    Resistance Training Performed  Yes    VAD Patient?  No    PAD/SET Patient?  No      Pain Assessment   Currently in Pain?  No/denies          Social History   Tobacco Use  Smoking Status Former Smoker  . Packs/day: 2.00  . Years: 20.00  . Pack years: 40.00  . Types: Cigarettes, Pipe  . Last attempt to quit: 11/22/1999  . Years since quitting: 18.2  Smokeless Tobacco Never Used  Tobacco Comment   quit cigs in 72 and pipe in 01    Goals Met:  Independence with exercise equipment Exercise tolerated well No report of cardiac concerns or symptoms Strength training completed today  Goals Unmet:  Not Applicable  Comments: Pt able to follow exercise prescription today without complaint.  Will continue to monitor for progression.    Dr. Emily Filbert is Medical Director for [place] and [facility].

## 2018-02-11 ENCOUNTER — Encounter: Payer: Medicare Other | Attending: Pulmonary Disease | Admitting: *Deleted

## 2018-02-11 DIAGNOSIS — J849 Interstitial pulmonary disease, unspecified: Secondary | ICD-10-CM | POA: Insufficient documentation

## 2018-02-11 DIAGNOSIS — J8410 Pulmonary fibrosis, unspecified: Secondary | ICD-10-CM

## 2018-02-11 DIAGNOSIS — J84112 Idiopathic pulmonary fibrosis: Secondary | ICD-10-CM | POA: Insufficient documentation

## 2018-02-11 NOTE — Progress Notes (Signed)
Daily Session Note  Patient Details  Name: Turner Baillie MRN: [id number] Date of Birth: 10/28/1935 Referring Provider:     Pulmonary Rehab from 11/13/2017 in [facility] Cardiac and Pulmonary Rehab  Referring Provider  Alba      Encounter Date: 02/11/2018  Check In: Session Check In - 02/11/18 1011      Check-In   Supervising physician immediately available to respond to emergencies  [facility] immediately available ER MD    Physician(s)  Dr. Jimmye Norman and Joni Fears    Location  [facility] Pul[facility] Re[facility]    Staff Present  Earlean Shawl, BS, ACSM CEP, Exercise Physiologist;Joseph [facility], BA, ACSM CEP, Exercise Physiologist    Medication changes reported      No    Fall or balance concerns reported     No    Tobacco Cessation  No Change    Warm-up and Cool-down  Performed as group-led instruction    Resistance Training Performed  Yes    VAD Patient?  No    PAD/SET Patient?  No      Pain Assessment   Currently in Pain?  No/denies    Multiple Pain Sites  No          Social History   Tobacco Use  Smoking Status Former Smoker  . Packs/day: 2.00  . Years: 20.00  . Pack years: 40.00  . Types: Cigarettes, Pipe  . Last attempt to quit: 11/22/1999  . Years since quitting: 18.2  Smokeless Tobacco Never Used  Tobacco Comment   quit cigs in 72 and pipe in 01    Goals Met:  Proper associated with RPD/PD & O2 Sat Independence with exercise equipment Exercise tolerated well No report of cardiac concerns or symptoms Strength training completed today  Goals Unmet:  Not Applicable  Comments: Pt able to follow exercise prescription today without complaint.  Will continue to monitor for progression.    Dr. Emily Filbert is Medical Director for [place] and [facility].

## 2018-02-13 DIAGNOSIS — J849 Interstitial pulmonary disease, unspecified: Secondary | ICD-10-CM | POA: Diagnosis not present

## 2018-02-13 DIAGNOSIS — J84112 Idiopathic pulmonary fibrosis: Secondary | ICD-10-CM | POA: Diagnosis not present

## 2018-02-13 NOTE — Progress Notes (Signed)
Daily Session Note  Patient Details  Name: Jeremiah Gibson MRN: [id number] Date of Birth: [birth year] Referring Provider:     Pulmonary Rehab from 11/13/2017 in [facility] Cardiac and Pulmonary Rehab  Referring Provider  Alba      Encounter Date: 02/13/2018  Check In: Session Check In - 02/13/18 1024      Check-In   Supervising physician immediately available to respond to emergencies  [facility] immediately available ER MD    Physician(s)  Dr. Jimmye Norman and Quentin Cornwall    Location  [facility]    Staff Present  Alberteen Sam, MA, RCEP, CCRP, Exercise Physiologist;Keygan Dumond [facility], [place], ACSM CEP, Exercise Physiologist    Medication changes reported      No    Fall or balance concerns reported     No    Tobacco Cessation  No Change    Warm-up and Cool-down  Performed as group-led instruction    Resistance Training Performed  Yes    VAD Patient?  No    PAD/SET Patient?  No      Pain Assessment   Currently in Pain?  No/denies          Social History   Tobacco Use  Smoking Status Former Smoker  . Packs/day: 2.00  . Years: 20.00  . Pack years: 40.00  . Types: Cigarettes, Pipe  . Last attempt to quit: 11/22/1999  . Years since quitting: 18.2  Smokeless Tobacco Never Used  Tobacco Comment   quit cigs in 72 and pipe in 01    Goals Met:  Independence with exercise equipment Exercise tolerated well No report of cardiac concerns or symptoms Strength training completed today  Goals Unmet:  Not Applicable  Comments: Pt able to follow exercise prescription today without complaint.  Will continue to monitor for progression.    Dr. Emily Filbert is Medical Director for [place] and [facility].

## 2018-02-14 ENCOUNTER — Other Ambulatory Visit: Payer: Self-pay | Admitting: Family Medicine

## 2018-02-14 DIAGNOSIS — I10 Essential (primary) hypertension: Secondary | ICD-10-CM

## 2018-02-14 MED ORDER — AMLODIPINE BESYLATE 2.5 MG PO TABS: 2.5000 mg | ORAL_TABLET | Freq: Every day | ORAL | 4 refills | Status: AC

## 2018-02-14 NOTE — Telephone Encounter (Signed)
[facility] fax[facility] refill request for the following medications:  amLODipine (NORVASC) 2.5 MG tablet  Last Rx: 11/21/16 LOV: 11/26/17 Please advise. Thanks TNP

## 2018-02-15 DIAGNOSIS — J849 Interstitial pulmonary disease, unspecified: Secondary | ICD-10-CM | POA: Diagnosis not present

## 2018-02-15 DIAGNOSIS — J84112 Idiopathic pulmonary fibrosis: Secondary | ICD-10-CM | POA: Diagnosis not present

## 2018-02-15 NOTE — Progress Notes (Signed)
Daily Session Note  Patient Details  Name: Jeremiah Gibson MRN: [id number] Date of Birth: 02/13/1936 Referring Provider:     Pulmonary Rehab from 11/13/2017 in [facility] Cardiac and Pulmonary Rehab  Referring Provider  Alba      Encounter Date: 02/15/2018  Check In: Session Check In - 02/15/18 1014      Check-In   Supervising physician immediately available to respond to emergencies  [facility] immediately available ER MD    Physician(s)  Dr. Joni Fears and [facility]    Location  [facility]Cardiac [facility] Pulmonary Rehab    Staff Present  Justin Mend RCP,RRT,BSRT;Amanda Oletta Darter, [place], ACSM CEP, Exercise Physiologist;Meredith Sherryll Burger, RN BSN    Medication changes reported      No    Fall or balance concerns reported     No    Warm-up and Cool-down  Performed as group-led Higher education careers adviser Performed  Yes    VAD Patient?  No    PAD/SET Patient?  No      Pain Assessment   Currently in Pain?  No/denies          Social History   Tobacco Use  Smoking Status Former Smoker  . Packs/day: 2.00  . Years: 20.00  . Pack years: 40.00  . Types: Cigarettes, Pipe  . Last attempt to quit: 11/22/1999  . Years since quitting: 18.2  Smokeless Tobacco Never Used  Tobacco Comment   quit cigs in 72 and pipe in 01    Goals Met:  Independence with exercise equipment Exercise tolerated well No report of cardiac concerns or symptoms Strength training completed today  Goals Unmet:  Not Applicable  Comments: Pt able to follow exercise prescription today without complaint.  Will continue to monitor for progression.    Dr. Emily Filbert is Medical Director for [place] and [facility].

## 2018-02-18 DIAGNOSIS — J849 Interstitial pulmonary disease, unspecified: Secondary | ICD-10-CM | POA: Diagnosis not present

## 2018-02-18 DIAGNOSIS — J84112 Idiopathic pulmonary fibrosis: Secondary | ICD-10-CM | POA: Diagnosis not present

## 2018-02-18 NOTE — Progress Notes (Signed)
Daily Session Note  Patient Details  Name: Jeremiah Gibson MRN: [id number] Date of Birth: 09-19-1935 Referring Provider:     Pulmonary Rehab from 11/13/2017 in [facility] Cardiac and Pulmonary Rehab  Referring Provider  Alba      Encounter Date: 02/18/2018  Check In: Session Check In - 02/18/18 1009      Check-In   Supervising physician immediately available to respond to emergencies  [facility] immediately available ER MD    Physician(s)  Dr. Kerman Passey and Quentin Cornwall    Location  [facility]-Cardiac & Pulmonary Rehab    Staff Present  Justin Mend Jaci Carrel, BS, ACSM CEP, Exercise Physiologist;Laureen Owens Shark, BS, RRT, Respiratory Therapist    Medication changes reported      No    Fall or balance concerns reported     No    Warm-up and Cool-down  Performed as group-led instruction    Resistance Training Performed  Yes    VAD Patient?  No    PAD/SET Patient?  No      Pain Assessment   Currently in Pain?  No/denies          Social History   Tobacco Use  Smoking Status Former Smoker  . Packs/day: 2.00  . Years: 20.00  . Pack years: 40.00  . Types: Cigarettes, Pipe  . Last attempt to quit: 11/22/1999  . Years since quitting: 18.2  Smokeless Tobacco Never Used  Tobacco Comment   quit cigs in 72 and pipe in 01    Goals Met:  Independence with exercise equipment Exercise tolerated well No report of cardiac concerns or symptoms Strength training completed today  Goals Unmet:  Not Applicable  Comments: Pt able to follow exercise prescription today without complaint.  Will continue to monitor for progression.    Dr. Emily Filbert is Medical Director for [facility] and [facility].

## 2018-02-20 DIAGNOSIS — J84112 Idiopathic pulmonary fibrosis: Secondary | ICD-10-CM | POA: Diagnosis not present

## 2018-02-20 DIAGNOSIS — J849 Interstitial pulmonary disease, unspecified: Secondary | ICD-10-CM | POA: Diagnosis not present

## 2018-02-20 DIAGNOSIS — J8410 Pulmonary fibrosis, unspecified: Secondary | ICD-10-CM

## 2018-02-20 NOTE — Progress Notes (Signed)
Daily Session Note  Patient Details  Name: Jeremiah Gibson MRN: [id number] Date of Birth: 1935-06-08 Referring Provider:     Pulmonary Rehab from 11/13/2017 in [facility] Rehab  Referring Provider  Alba      Encounter Date: 02/20/2018  Check In:      Social History   Tobacco Use  Smoking Status Former Smoker  . Packs/day: 2.00  . Years: 20.00  . Pack years: 40.00  . Types: Cigarettes, Pipe  . Last attempt to quit: 11/22/1999  . Years since quitting: 18.2  Smokeless Tobacco Never Used  Tobacco Comment   quit cigs in 72 and pipe in 01    Goals Met:  Proper associated with RPD/PD & O2 Sat Independence with exercise equipment Exercise tolerated well Strength training completed today  Goals Unmet:  Not Applicable  Comments: Pt able to follow exercise prescription today without complaint.  Will continue to monitor for progression.    Dr. Emily Filbert is Medical Director for [place] and [facility].

## 2018-02-22 ENCOUNTER — Encounter: Payer: Medicare Other | Admitting: *Deleted

## 2018-02-22 DIAGNOSIS — J84112 Idiopathic pulmonary fibrosis: Secondary | ICD-10-CM | POA: Diagnosis not present

## 2018-02-22 DIAGNOSIS — J849 Interstitial pulmonary disease, unspecified: Secondary | ICD-10-CM | POA: Diagnosis not present

## 2018-02-22 NOTE — Progress Notes (Signed)
Daily Session Note  Patient Details  Name: Jeremiah Gibson MRN: [id number] Date of Birth: 12/29/1935 Referring Provider:     Pulmonary Rehab from 11/13/2017 in [facility] Cardiac and Pulmonary Rehab  Referring Provider  Alba      Encounter Date: 02/22/2018  Check In: Session Check In - 02/22/18 1014      Check-In   Supervising physician immediately available to respond to emergencies  [facility] immediately available ER MD    Physician(s)  Dr. Siadecki and McShane    Location  [facility]-Cardiac & Pulmonary Rehab    Staff Present  Amanda Sommer, BA, ACSM CEP, Exercise Physiologist;Joseph Hood RCP,RRT,BSRT;Meredith Craven, RN BSN    Medication changes reported      No    Fall or balance concerns reported     No    Warm-up and Cool-down  Performed as group-led instruction    Resistance Training Performed  Yes    VAD Patient?  No    PAD/SET Patient?  No      Pain Assessment   Currently in Pain?  No/denies          Social History   Tobacco Use  Smoking Status Former Smoker  . Packs/day: 2.00  . Years: 20.00  . Pack years: 40.00  . Types: Cigarettes, Pipe  . Last attempt to quit: 11/22/1999  . Years since quitting: 18.2  Smokeless Tobacco Never Used  Tobacco Comment   quit cigs in 72 and pipe in 01    Goals Met:  Proper associated with RPD/PD & O2 Sat Independence with exercise equipment Using PLB without cueing & demonstrates good technique Exercise tolerated well No report of cardiac concerns or symptoms Strength training completed today  Goals Unmet:  Not Applicable  Comments: Pt able to follow exercise prescription today without complaint.  Will continue to monitor for progression.    Dr. Mark Miller is Medical Director for [facility] and [facility]. 

## 2018-02-25 ENCOUNTER — Encounter: Payer: Medicare Other | Admitting: *Deleted

## 2018-02-25 DIAGNOSIS — J84112 Idiopathic pulmonary fibrosis: Secondary | ICD-10-CM | POA: Diagnosis not present

## 2018-02-25 DIAGNOSIS — J849 Interstitial pulmonary disease, unspecified: Secondary | ICD-10-CM

## 2018-02-25 DIAGNOSIS — J8410 Pulmonary fibrosis, unspecified: Secondary | ICD-10-CM

## 2018-02-25 NOTE — Progress Notes (Signed)
Daily Session Note  Patient Details  Name: Jeremiah Gibson MRN: [id number] Date of Birth: 04-26-1935 Referring Provider:     Pulmonary Rehab from 11/13/2017 in [facility] Card[facility] and Pulmonary Rehab  Referring Provider  Alba      Encounter Date: 02/25/2018  Check In: Session Check In - 02/25/18 1005      Check-In   Supervising physician immediately available to respond to emergencies  [facility] immediately available ER MD    Physician(s)  Dr. Joni Fears and Dr. Alfred Levins    Location  [facility] & Pul[facility] Rehab    Staff Present  Earlean Shawl, BS, ACSM CEP, Exercise Physiologist;Amanda Oletta Darter, BA, ACSM CEP, Exercise Physiologist;Joseph Tessie Fass RCP,RRT,BSRT    Medication changes reported      No    Fall or balance concerns reported     No    Tobacco Cessation  No Change    Warm-up and Cool-down  Performed as group-led instruction    Resistance Training Performed  Yes    VAD Patient?  No    PAD/SET Patient?  No      Pain Assessment   Currently in Pain?  No/denies    Multiple Pain Sites  No          Social History   Tobacco Use  Smoking Status Former Smoker  . Packs/day: 2.00  . Years: 20.00  . Pack years: 40.00  . Types: Cigarettes, Pipe  . Last attempt to quit: 11/22/1999  . Years since quitting: 18.2  Smokeless Tobacco Never Used  Tobacco Comment   quit cigs in 72 and pipe in 01    Goals Met:  Proper associated with RPD/PD & O2 Sat Independence with exercise equipment Exercise tolerated well No report of cardiac concerns or symptoms Strength training completed today  Goals Unmet:  Not Applicable  Comments: Pt able to follow exercise prescription today without complaint.  Will continue to monitor for progression.    Dr. Emily Filbert is Medical Director for [place] and [facility].

## 2018-02-25 NOTE — Progress Notes (Signed)
Pulmonary Individual Treatment Plan  Patient Details  Name: Jeremiah Gibson MRN: [id number] Date of Birth: 1935-05-29 Referring Provider:     Pulmonary Rehab from 11/13/2017 in [facility] Cardiac and Pulmonary Rehab  Referring Provider  Alba      Initial Encounter Date:    Pulmonary Rehab from 11/13/2017 in [facility] Cardiac and Pulmonary Rehab  Date  11/13/17      Visit Diagnosis: Interstitial lung disease ([place])  Patient's Home Medications on Admission:  Current Outpatient Medications:  .  albuterol (VENTOLIN HFA) 108 (90 Base) MCG/ACT inhaler, Inhale 2 puffs into the lungs every 4 (four) hours as needed for wheezing or shortness of breath., Disp: 3 Inhaler, Rfl: 3 .  allopurinol (ZYLOPRIM) 100 MG tablet, TAKE 2 TABLETS BY MOUTH TWICE DAILY. GENERIC EQUIVALENT FOR ZYLOPRIM, Disp: 360 tablet, Rfl: 3 .  amLODipine (NORVASC) 2.5 MG tablet, Take 1 tablet (2.5 mg total) by mouth daily., Disp: 90 tablet, Rfl: 4 .  aspirin 81 MG tablet, Take 81 mg by mouth every evening. , Disp: , Rfl:  .  clopidogrel (PLAVIX) 75 MG tablet, TAKE 1 TABLET BY MOUTH DAILY GENERIC EQUIVALENT FOR PLAVIX, Disp: 90 tablet, Rfl: 4 .  finasteride (PROSCAR) 5 MG tablet, TAKE 1 TABLET BY MOUTH EVERY DAY. GENERIC EQUIVALENT FOR PROSCAR, Disp: 90 tablet, Rfl: 4 .  fish oil-omega-3 fatty acids 1000 MG capsule, Take 1 g by mouth 2 (two) times daily. , Disp: , Rfl:  .  fluticasone (FLONASE) 50 MCG/ACT nasal spray, Place 2 sprays into both nostrils daily., Disp: 16 g, Rfl: 1 .  Fluticasone-Salmeterol (ADVAIR DISKUS) 250-50 MCG/DOSE AEPB, Inhale 1 puff into the lungs 2 (two) times daily., Disp: 3 each, Rfl: 5 .  glipiZIDE (GLUCOTROL XL) 5 MG 24 hr tablet, TAKE 1 TABLET BY MOUTH DAILY WITH BREAKFAST, Disp: 90 tablet, Rfl: 4 .  glucose blood (ONE TOUCH ULTRA TEST) test strip, Use as instructed to check blood sugar daily. Dx: E11.21, Disp: 100 each, Rfl: 4 .  losartan (COZAAR) 100 MG tablet, TAKE 1 TABLET BY MOUTH EVERY DAY FOR BLOOD PRESSURE,  Disp: 90 tablet, Rfl: 3 .  Magnesium 400 MG TABS, Take 400 mg by mouth 2 (two) times daily. , Disp: , Rfl:  .  metFORMIN (GLUCOPHAGE) 500 MG tablet, TAKE 2 TABLETS BY MOUTH TWICE DAILY WITH A MEAL, Disp: 360 tablet, Rfl: 4 .  MULTIPLE VITAMIN PO, MULTIVITAMINS (Oral Tablet)  1 Every Day for 0 days  Quantity: 0.00;  Refills: 0   Ordered :06-Jan-2010  Jeremiah Gibson ;  Started 22-Mar-2009 Active, Disp: , Rfl:  .  Nintedanib (OFEV) 100 MG CAPS, Take 2 capsules (200 mg total) by mouth 2 (two) times daily with a meal., Disp: 180 capsule, Rfl: 1 .  pantoprazole (PROTONIX) 20 MG tablet, Take 20 mg by mouth 2 (two) times daily., Disp: , Rfl:  .  polyethylene glycol-electrolytes (NULYTELY/GOLYTELY) 420 g solution, Take 4,000 mLs by mouth once., Disp: , Rfl:  .  potassium chloride SA (K-DUR,KLOR-CON) 20 MEQ tablet, TAKE 1 TABLET BY MOUTH DAILY, Disp: 90 tablet, Rfl: 4 .  pravastatin (PRAVACHOL) 40 MG tablet, TAKE 1 TABLET BY MOUTH DAILY, Disp: 90 tablet, Rfl: 4 .  terazosin (HYTRIN) 5 MG capsule, TAKE 1 CAPSULE BY MOUTH EVERY DAY FOR PROSTATE, Disp: 90 capsule, Rfl: 4 .  triamterene-hydrochlorothiazide (MAXZIDE-25) 37.5-25 MG tablet, TAKE 1 TABLET BY MOUTH EVERY DAY FOR BLOOD PRESSURE GENERIC EQUIVALENT FOR MAXZIDE-25, Disp: 90 tablet, Rfl: 4  Past Medical History: Past Medical History:  Diagnosis  Date  . Anemia   . Arthritis   . Asthma   . Colon polyps   . Cough    CHRONIC  . Diabetes mellitus without complication ([place])   . Diverticulosis of colon (without mention of hemorrhage)   . Dyspnea    pulmonary fibrosis  . Dysrhythmia   . Esophageal reflux   . Gout   . Hyperlipidemia   . Hypertension   . Impotence of organic origin   . Leg cramps   . Pulmonary fibrosis ([place])   . Pulmonary fibrosis ([place])   . Wheezing     Tobacco Use: Social History   Tobacco Use  Smoking Status Former Smoker  . Packs/day: 2.00  . Years: 20.00  . Pack years: 40.00  . Types: Cigarettes, Pipe  . Last  attempt to quit: 11/22/1999  . Years since quitting: 18.2  Smokeless Tobacco Never Used  Tobacco Comment   quit cigs in 72 and pipe in 01    Labs: Recent Review Flowsheet Data    Labs for ITP Cardiac and Pulmonary Rehab Latest Ref Rng & Units 11/21/2016 01/30/2017 03/27/2017 07/23/2017 11/26/2017   Cholestrol 100 - 199 mg/dL - 156 - - 146   LDLCALC 0 - 99 mg/dL - 90 - - 80   HDL >39 mg/dL - 45 - - 45   Trlycerides 0 - 149 mg/dL - 110 - - 104   Hemoglobin A1c 4.0 - 5.6 % 7.3 - 6.7 7.0 6.7(A)       Pulmonary Assessment Scores: Pulmonary Assessment Scores    Row Name 11/13/17 1058 02/18/18 1044       ADL UCSD   ADL Phase  Entry  Exit    SOB Score total  10  18    Rest  0  0    Walk  0  1    Stairs  0  3    Bath  0  0    Dress  0  1    Shop  0  0      CAT Score   CAT Score  9  13      mMRC Score   mMRC Score  1  -       Pulmonary Function Assessment: Pulmonary Function Assessment - 11/13/17 1121      Breath   Bilateral Breath Sounds  Clear;Decreased    Shortness of Breath  Yes       Exercise Target Goals: Exercise Program Goal: Individual exercise prescription set using results from initial 6 min walk test and THRR while considering  patient's activity barriers and safety.   Exercise Prescription Goal: Initial exercise prescription builds to 30-45 minutes a day of aerobic activity, 2-3 days per week.  Home exercise guidelines will be given to patient during program as part of exercise prescription that the participant will acknowledge.  Activity Barriers & Risk Stratification:   6 Minute Walk: 6 Minute Walk    Row Name 11/13/17 1201         6 Minute Walk   Phase  Initial     Distance  1300 feet     Walk Time  6 minutes     # of Rest Breaks  0     MPH  2.46     METS  2.8     RPE  11     Perceived Dyspnea   1     VO2 Peak  9.8     Symptoms  No  Resting HR  82 bpm     Resting BP  124/64     Resting Oxygen Saturation   96 %     Exercise Oxygen  Saturation  during 6 min walk  94 %     Max Ex. HR  108 bpm     Max Ex. BP  174/84     2 Minute Post BP  144/64       Interval HR   1 Minute HR  87     2 Minute HR  96     3 Minute HR  96     4 Minute HR  104     5 Minute HR  108     6 Minute HR  103     2 Minute Post HR  90     Interval Heart Rate?  Yes       Interval Oxygen   Interval Oxygen?  Yes     Baseline Oxygen Saturation %  96 %     1 Minute Oxygen Saturation %  94 %     1 Minute Liters of Oxygen  0 L     2 Minute Oxygen Saturation %  94 %     2 Minute Liters of Oxygen  0 L     3 Minute Oxygen Saturation %  94 %     3 Minute Liters of Oxygen  0 L     4 Minute Oxygen Saturation %  95 %     4 Minute Liters of Oxygen  0 L     5 Minute Oxygen Saturation %  94 %     5 Minute Liters of Oxygen  0 L     6 Minute Oxygen Saturation %  94 %     6 Minute Liters of Oxygen  0 L     2 Minute Post Oxygen Saturation %  97 %     2 Minute Post Liters of Oxygen  0 L       Oxygen Initial Assessment: Oxygen Initial Assessment - 11/13/17 1122      Home Oxygen   Home Oxygen Device  None    Sleep Oxygen Prescription  None    Home Exercise Oxygen Prescription  None    Home at Rest Exercise Oxygen Prescription  None      Initial 6 min Walk   Oxygen Used  None      Program Oxygen Prescription   Program Oxygen Prescription  None      Intervention   Short Term Goals  To learn and demonstrate proper use of respiratory medications;To learn and demonstrate proper pursed lip breathing techniques or other breathing techniques.;To learn and understand importance of maintaining oxygen saturations>88%;To learn and understand importance of monitoring SPO2 with pulse oximeter and demonstrate accurate use of the pulse oximeter.    Long  Term Goals  Maintenance of O2 saturations>88%;Exhibits proper breathing techniques, such as pursed lip breathing or other method taught during program session;Compliance with respiratory medication;Demonstrates  proper use of MDI's;Verbalizes importance of monitoring SPO2 with pulse oximeter and return demonstration       Oxygen Re-Evaluation: Oxygen Re-Evaluation    Row Name 11/19/17 1015 12/17/17 1636 01/07/18 1042 02/13/18 1425       Program Oxygen Prescription   Program Oxygen Prescription  None  None  None  None      Home Oxygen   Home Oxygen Device  None  None  None  None  Sleep Oxygen Prescription  None  None  None  None    Home Exercise Oxygen Prescription  None  None  None  None    Home at Rest Exercise Oxygen Prescription  None  None  None  None      Goals/Expected Outcomes   Short Term Goals  To learn and demonstrate proper use of respiratory medications;To learn and demonstrate proper pursed lip breathing techniques or other breathing techniques.;To learn and understand importance of maintaining oxygen saturations>88%;To learn and understand importance of monitoring SPO2 with pulse oximeter and demonstrate accurate use of the pulse oximeter.  To learn and demonstrate proper use of respiratory medications;To learn and demonstrate proper pursed lip breathing techniques or other breathing techniques.;To learn and understand importance of maintaining oxygen saturations>88%;To learn and understand importance of monitoring SPO2 with pulse oximeter and demonstrate accurate use of the pulse oximeter.  To learn and demonstrate proper use of respiratory medications;To learn and demonstrate proper pursed lip breathing techniques or other breathing techniques.;To learn and understand importance of maintaining oxygen saturations>88%;To learn and understand importance of monitoring SPO2 with pulse oximeter and demonstrate accurate use of the pulse oximeter.  To learn and demonstrate proper use of respiratory medications;To learn and demonstrate proper pursed lip breathing techniques or other breathing techniques.;To learn and understand importance of maintaining oxygen saturations>88%;To learn and  understand importance of monitoring SPO2 with pulse oximeter and demonstrate accurate use of the pulse oximeter.    Long  Term Goals  Maintenance of O2 saturations>88%;Exhibits proper breathing techniques, such as pursed lip breathing or other method taught during program session;Compliance with respiratory medication;Demonstrates proper use of MDI's;Verbalizes importance of monitoring SPO2 with pulse oximeter and return demonstration  Maintenance of O2 saturations>88%;Exhibits proper breathing techniques, such as pursed lip breathing or other method taught during program session;Compliance with respiratory medication;Demonstrates proper use of MDI's;Verbalizes importance of monitoring SPO2 with pulse oximeter and return demonstration  Maintenance of O2 saturations>88%;Exhibits proper breathing techniques, such as pursed lip breathing or other method taught during program session;Compliance with respiratory medication;Demonstrates proper use of MDI's;Verbalizes importance of monitoring SPO2 with pulse oximeter and return demonstration  Maintenance of O2 saturations>88%;Exhibits proper breathing techniques, such as pursed lip breathing or other method taught during program session;Compliance with respiratory medication;Demonstrates proper use of MDI's;Verbalizes importance of monitoring SPO2 with pulse oximeter and return demonstration    Comments  Reviewed PLB technique with pt.  Talked about how it work and it's important to maintaining his exercise saturations.    Patient states he gets short of breath mostly when he goes up hills and inclines. He does have a pulse oximeter to check his oxygen saturation at home. Informed him why it is important to check when he is exerting himself.  Patient states that his oxygen at home is stable around 95 percent. He checks it when he is at rest and on exertion. He has no questions about his home medications. He is taking medications for his fibrosis and exercising so he does  not get more short of breath down the road.  [facility].  Practiced PLB with patient. Patient understands why PLB is important and to use it when he is short of breath. Reviewed that oxygen saturations should be 88 percent and above. Patient has a pulse oximeter at home to check his oxygen.    Goals/Expected Outcomes  Short: Become more profiecient at using PLB.  Long: Become independent at using PLB.  Short: monitor oxygen at home with exertion. Long: maintain oxygen saturations above 88 percent independently.  Short: continue exercising at home and taking medications. Long: maintain exercise post [facility] to decrease oxygen needs.  Short: use PLB with exertion. Long: use PLB on exertion proficiently and independently.       Oxygen Discharge (Final Oxygen Re-Evaluation): Oxygen Re-Evaluation - 02/13/18 1425      Program Oxygen Prescription   Program Oxygen Prescription  None      Home Oxygen   Home Oxygen Device  None    Sleep Oxygen Prescription  None    Home Exercise Oxygen Prescription  None    Home at Rest Exercise Oxygen Prescription  None      Goals/Expected Outcomes   Short Term Goals  To learn and demonstrate proper use of respiratory medications;To learn and demonstrate proper pursed lip breathing techniques or other breathing techniques.;To learn and understand importance of maintaining oxygen saturations>88%;To learn and understand importance of monitoring SPO2 with pulse oximeter and demonstrate accurate use of the pulse oximeter.    Long  Term Goals  Maintenance of O2 saturations>88%;Exhibits proper breathing techniques, such as pursed lip breathing or other method taught during program session;Compliance with respiratory medication;Demonstrates proper use of MDI's;Verbalizes importance of monitoring SPO2 with pulse oximeter and return demonstration    Comments  Practiced PLB with patient. Patient understands  why PLB is important and to use it when he is short of breath. Reviewed that oxygen saturations should be 88 percent and above. Patient has a pulse oximeter at home to check his oxygen.    Goals/Expected Outcomes  Short: use PLB with exertion. Long: use PLB on exertion proficiently and independently.       Initial Exercise Prescription: Initial Exercise Prescription - 11/13/17 1200      Date of Initial Exercise RX and Referring Provider   Date  11/13/17    Referring Provider  Alba      Recumbant Bike   Level  2    RPM  60    Watts  10    Minutes  15    METs  2.4      T5 Nustep   Level  2    SPM  80    Minutes  15    METs  2.4      Track   Laps  30    Minutes  15    METs  2.4      Prescription Details   Frequency (times per week)  3    Duration  Progress to 45 minutes of aerobic exercise without signs/symptoms of physical distress      Intensity   THRR 40-80% of Max Heartrate  105-128    Ratings of Perceived Exertion  11-15    Perceived Dyspnea  0-4      Resistance Training   Training Prescription  Yes    Weight  3 lb    Reps  10-15       Perform Capillary Blood Glucose checks as needed.  Exercise Prescription Changes: Exercise Prescription Changes    Row Name 11/21/17 1500 11/23/17 1000 12/05/17 1200 12/19/17 1100 01/16/18 1200     Response to Exercise   Blood Pressure (Admit)  162/72  -  106/58  130/58  126/62   Blood Pressure (Exercise)  154/74  -  -  -  -   Blood Pressure (Exit)  120/70  -  108/70  122/66  134/70   Heart Rate (Admit)  86 bpm  -  54 bpm  80 bpm  82 bpm   Heart Rate (Exercise)  91 bpm  -  88 bpm  111 bpm  94 bpm   Heart Rate (Exit)  75 bpm  -  70 bpm  84 bpm  72 bpm   Oxygen Saturation (Admit)  96 %  -  94 %  94 %  96 %   Oxygen Saturation (Exercise)  97 %  -  95 %  96 %  96 %   Oxygen Saturation (Exit)  97 %  -  98 %  92 %  98 %   Rating of Perceived Exertion (Exercise)  12  -  _0 Perceived Dyspnea (Exercise)  2  -  _1 Symptoms  none  -  none  none  none   Comments  second day  -  -  -  -   Duration  Progress to 45 minutes of aerobic exercise without signs/symptoms of physical distress  -  Continue with 45 min of aerobic exercise without signs/symptoms of physical distress.  Continue with 45 min of aerobic exercise without signs/symptoms of physical distress.  Continue with 45 min of aerobic exercise without signs/symptoms of physical distress.   Intensity  THRR New  -  THRR unchanged  THRR unchanged  THRR unchanged     Progression   Progression  Continue to progress workloads to maintain intensity without signs/symptoms of physical distress.  -  Continue to progress workloads to maintain intensity without signs/symptoms of physical distress.  Continue to progress workloads to maintain intensity without signs/symptoms of physical distress.  Continue to progress workloads to maintain intensity without signs/symptoms of physical distress.   Average METs  2  -  2.25  2.4  2.3     Resistance Training   Training Prescription  Yes  -  Yes  Yes  Yes   Weight  3 lb  -  3 lb  3 lb  3 lb   Reps  10-15  -  10-15  10-15  10-15     Interval Training   Interval Training  -  -  No  No  No     T5 Nustep   Level  3  -  _2 SPM  80  -  80  80  80   Minutes  15  -  _3 METs  1.9  -  1._4 Track   Laps  34  -  34  39  37   Minutes  15  -  _5 METs  2.6  -  2.6  2.9  2.76     Home Exercise Plan   Plans to continue exercise at  [facility] ([facility] [employer]  -  -  -   Frequency  -  Add 2 additional days to program exercise sessions.  -  -  -   Initial Home Exercises Provided  -  11/23/17  -  -  -   Row Name 01/30/18 1100 02/13/18 1200           Response to Exercise   Blood Pressure (Admit)  120/74  128/72      Blood Pressure (Exit)  124/60  122/66      Heart Rate (Admit)  61 bpm  88 bpm      Heart Rate (Exercise)  88 bpm  91 bpm      Heart Rate (Exit)  75 bpm  75 bpm       Oxygen Saturation (Admit)  97 %  94 %      Oxygen Saturation (Exercise)  94 %  94 %      Oxygen Saturation (Exit)  98 %  98 %      Rating of Perceived Exertion (Exercise)  13  13      Perceived Dyspnea (Exercise)  2  2      Symptoms  none  none      Duration  Continue with 45 min of aerobic exercise without signs/symptoms of physical distress.  Continue with 45 min of aerobic exercise without signs/symptoms of physical distress.      Intensity  THRR unchanged  THRR unchanged        Progression   Progression  Continue to progress workloads to maintain intensity without signs/symptoms of physical distress.  Continue to progress workloads to maintain intensity without signs/symptoms of physical distress.      Average METs  2.4  2.46        Resistance Training   Training Prescription  Yes  Yes      Weight  3  3 lb      Reps  10-15  10-15        Interval Training   Interval Training  No  No        Recumbant Bike   Level  2  2      RPM  60  60      Watts  13  25      Minutes  15  15      METs  1.9  2.88        T5 Nustep   Level  5  5      SPM  80  80      Minutes  15  15      METs  1.9  2        Track   Laps  -  33      Minutes  -  15      METs  -  2.5         Exercise Comments: Exercise Comments    Row Name 11/19/17 1018 11/23/17 1043         Exercise Comments   First full day of exercise!  Patient was oriented to gym and equipment including functions, settings, policies, and procedures.  Patient's individual exercise prescription and treatment plan were reviewed.  All starting workloads were established based on the results of the 6 minute walk test done at initial orientation visit.  The plan for exercise progression was also introduced and progression will be customized based on patient's performance and goals.  Reviewed home exercise with pt today.  Pt plans to go to [facility] for exercise.  Reviewed THR, pulse, RPE, sign and symptoms, NTG use, and when to call 911 or MD.   Also discussed weather considerations and indoor options.  Pt voiced understanding.         Exercise Goals and Review: Exercise Goals    Row Name 11/13/17 1200             Exercise Goals   Increase Physical Activity  Yes  Intervention  Provide advice, education, support and counseling about physical activity/exercise needs.;Develop an individualized exercise prescription for aerobic and resistive training based on initial evaluation findings, risk stratification, comorbidities and participant's personal goals.       Expected Outcomes  Short Term: Attend rehab on a regular basis to increase amount of physical activity.;Long Term: Add in home exercise to make exercise part of routine and to increase amount of physical activity.;Long Term: Exercising regularly at least 3-5 days a week.       Increase Strength and Stamina  Yes       Intervention  Provide advice, education, support and counseling about physical activity/exercise needs.;Develop an individualized exercise prescription for aerobic and resistive training based on initial evaluation findings, risk stratification, comorbidities and participant's personal goals.       Expected Outcomes  Short Term: Increase workloads from initial exercise prescription for resistance, speed, and METs.;Short Term: Perform resistance training exercises routinely during rehab and add in resistance training at home;Long Term: Improve cardiorespiratory fitness, muscular endurance and strength as measured by increased METs and functional capacity (6MWT)       Able to understand and use rate of perceived exertion (RPE) scale  Yes       Intervention  Provide education and explanation on how to use RPE scale       Expected Outcomes  Short Term: Able to use RPE daily in rehab to express subjective intensity level;Long Term:  Able to use RPE to guide intensity level when exercising independently       Able to understand and use Dyspnea scale  Yes        Intervention  Provide education and explanation on how to use Dyspnea scale       Expected Outcomes  Short Term: Able to use Dyspnea scale daily in rehab to express subjective sense of shortness of breath during exertion;Long Term: Able to use Dyspnea scale to guide intensity level when exercising independently       Knowledge and understanding of Target Heart Rate Range (THRR)  Yes       Intervention  Provide education and explanation of THRR including how the numbers were predicted and where they are located for reference       Expected Outcomes  Short Term: Able to state/look up THRR;Short Term: Able to use daily as guideline for intensity in rehab;Long Term: Able to use THRR to govern intensity when exercising independently       Able to check pulse independently  Yes       Intervention  Provide education and demonstration on how to check pulse in carotid and radial arteries.;Review the importance of being able to check your own pulse for safety during independent exercise       Expected Outcomes  Short Term: Able to explain why pulse checking is important during independent exercise;Long Term: Able to check pulse independently and accurately       Understanding of Exercise Prescription  Yes       Intervention  Provide education, explanation, and written materials on patient's individual exercise prescription       Expected Outcomes  Short Term: Able to explain program exercise prescription;Long Term: Able to explain home exercise prescription to exercise independently          Exercise Goals Re-Evaluation : Exercise Goals Re-Evaluation    Row Name 11/19/17 1016 11/23/17 1044 12/05/17 1224 12/19/17 1136 01/16/18 1239     Exercise Goal Re-Evaluation   Exercise Goals Review  Increase Physical Activity;Increase Strength and Stamina;Able to understand and use rate of perceived exertion (RPE) scale;Knowledge and understanding of Target Heart Rate Range (THRR);Understanding of Exercise  Prescription;Able to understand and use Dyspnea scale  Increase Physical Activity;Able to understand and use rate of perceived exertion (RPE) scale;Knowledge and understanding of Target Heart Rate Range (THRR);Increase Strength and Stamina;Able to understand and use Dyspnea scale  Increase Physical Activity;Increase Strength and Stamina;Able to understand and use rate of perceived exertion (RPE) scale;Able to understand and use Dyspnea scale  Increase Physical Activity;Increase Strength and Stamina;Able to understand and use rate of perceived exertion (RPE) scale;Able to understand and use Dyspnea scale  Increase Physical Activity;Increase Strength and Stamina;Able to understand and use rate of perceived exertion (RPE) scale;Able to understand and use Dyspnea scale;Knowledge and understanding of Target Heart Rate Range (THRR);Understanding of Exercise Prescription   Comments  Reviewed RPE scale, THR and program prescription with pt today.  Pt voiced understanding and was given a copy of goals to take home.   Reviewed home exercise with pt today.  Pt plans to go to [facility] for exercise.  Reviewed THR, pulse, RPE, sign and symptoms, NTG use, and when to call 911 or MD.  Also discussed weather considerations and indoor options.  Pt voiced understanding.  Jeremiah Gibson tolerates exercise well.  He walks consistently 34-37 laps around the track.  Jeremiah Gibson is graudally increasing laps walked and overall METs.  He will miss a couple weeks going to [place] for his anniversary.  Galvin has maintained fitness during his vacation in October. Staff will encourage increasing weight strength training and on machines.    Expected Outcomes  Short: Use RPE daily to regulate intensity. Long: Follow program prescription in THR.  Short - exercise 3 days per week between LW and home Long - exercise 3 days on his own   Short - continue to attend consistently, consider 4 lb for strength work Long - increase from current MET level  Short - exercise  while on vacation Long - increase MET level  Short - inc to 4 lb for strength work Long - increase MET level   [place] Name 01/30/18 1153 02/13/18 1220           Exercise Goal Re-Evaluation   Exercise Goals Review  Increase Physical Activity;Increase Strength and Stamina;Able to understand and use rate of perceived exertion (RPE) scale;Able to understand and use Dyspnea scale;Knowledge and understanding of Target Heart Rate Range (THRR);Understanding of Exercise Prescription  Increase Physical Activity;Increase Strength and Stamina;Able to understand and use rate of perceived exertion (RPE) scale;Able to understand and use Dyspnea scale;Knowledge and understanding of Target Heart Rate Range (THRR);Understanding of Exercise Prescription      Comments  Yassine has increased level on T5 NS.  Overall METS slightly increased.  Staff will monitor progress.  Eliab works consistently at [employer].  He has increased endurance walking.  Staff will encourage increase of weight to 4 lb      Expected Outcomes  Short - attend consistently Long - increase MET level  SHort - increase to 4 lb strength training Long - increase MET level         Discharge Exercise Prescription (Final Exercise Prescription Changes): Exercise Prescription Changes - 02/13/18 1200      Response to Exercise   Blood Pressure (Admit)  128/72    Blood Pressure (Exit)  122/66    Heart Rate (Admit)  88 bpm    Heart Rate (Exercise)  91 bpm  Heart Rate (Exit)  75 bpm    Oxygen Saturation (Admit)  94 %    Oxygen Saturation (Exercise)  94 %    Oxygen Saturation (Exit)  98 %    Rating of Perceived Exertion (Exercise)  13    Perceived Dyspnea (Exercise)  2    Symptoms  none    Duration  Continue with 45 min of aerobic exercise without signs/symptoms of physical distress.    Intensity  THRR unchanged      Progression   Progression  Continue to progress workloads to maintain intensity without signs/symptoms of physical distress.    Average  METs  2.46      Resistance Training   Training Prescription  Yes    Weight  3 lb    Reps  10-15      Interval Training   Interval Training  No      Recumbant Bike   Level  2    RPM  60    Watts  25    Minutes  15    METs  2.88      T5 Nustep   Level  5    SPM  80    Minutes  15    METs  2      Track   Laps  33    Minutes  15    METs  2.5       Nutrition:  Target Goals: Understanding of nutrition guidelines, daily intake of sodium <1587m, cholesterol <2047m calories 30% from fat and 7% or less from saturated fats, daily to have 5 or more servings of fruits and vegetables.  Biometrics: Pre Biometrics - 11/13/17 1157      Pre Biometrics   Height  5' 10.5" (1.791 m)    Weight  197 lb 11.2 oz (89.7 kg)    Waist Circumference  42.5 inches    Hip Circumference  43 inches    Waist to Hip Ratio  0.99 %    BMI (Calculated)  27.96        Nutrition Therapy Plan and Nutrition Goals: Nutrition Therapy & Goals - 11/19/17 1043      Nutrition Therapy   RD appointment deferred  Yes   reports his wife to be monitoring his diet      Nutrition Assessments: Nutrition Assessments - 11/13/17 1100      MEDFICTS Scores   Pre Score  33       Nutrition Goals Re-Evaluation:   Nutrition Goals Discharge (Final Nutrition Goals Re-Evaluation):   Psychosocial: Target Goals: Acknowledge presence or absence of significant depression and/or stress, maximize coping skills, provide positive support system. Participant is able to verbalize types and ability to use techniques and skills needed for reducing stress and depression.   Initial Review & Psychosocial Screening: Initial Psych Review & Screening - 11/13/17 1117      Initial Review   Current issues with  None Identified      Family Dynamics   Good Support System?  Yes    Comments  Wife and Kids are good support      Screening Interventions   Interventions  Encouraged to exercise;Program counselor consult;Provide  feedback about the scores to participant;To provide support and resources with identified psychosocial needs    Expected Outcomes  Short Term goal: Utilizing psychosocial counselor, staff and physician to assist with identification of specific Stressors or current issues interfering with healing process. Setting desired goal for each stressor or current issue identified.;Long  Term Goal: Stressors or current issues are controlled or eliminated.;Short Term goal: Identification and review with participant of any Quality of Life or Depression concerns found by scoring the questionnaire.;Long Term goal: The participant improves quality of Life and PHQ9 Scores as seen by post scores and/or verbalization of changes       Quality of Life Scores:  Scores of 19 and below usually indicate a poorer quality of life in these areas.  A difference of  2-3 points is a clinically meaningful difference.  A difference of 2-3 points in the total score of the Quality of Life Index has been associated with significant improvement in overall quality of life, self-image, physical symptoms, and general health in studies assessing change in quality of life.  PHQ-9: Recent Review Flowsheet Data    Depression screen [facility] 2/9 02/18/2018 11/13/2017 04/10/2016 12/28/2015 11/23/2015   Decreased Interest 0 0 0 0 0   Down, Depressed, Hopeless 0 0 0 0 0   PHQ - 2 Score 0 0 0 0 0   Altered sleeping 0 0 0 - 0   Tired, decreased energy 1 0 0 - 0   Change in appetite 0 0 0 - 0   Feeling bad or failure about yourself  0 0 0 - 0   Trouble concentrating 0 0 0 - 0   Moving slowly or fidgety/restless 0 0 0 - 0   Suicidal thoughts 0 0 0 - 0   PHQ-9 Score 1 0 0 - 0   Difficult doing work/chores Not difficult at all Not difficult at all - - -     Interpretation of Total Score  Total Score Depression Severity:  1-4 = Minimal depression, 5-9 = Mild depression, 10-14 = Moderate depression, 15-19 = Moderately severe depression, 20-27 = Severe  depression   Psychosocial Evaluation and Intervention: Psychosocial Evaluation - 11/19/17 1107      Psychosocial Evaluation & Interventions   Comments  Mr. Vanwyk Ricketson)  has returned to this program after almost a year and counselor met with him for his initial psychosocial evaluation today. He is an 82 year old who has a strong support system with a spouse of 42 years and (2) adult children who live in other states.  Chayim is also actively involved in his faith community.  He has multiple health issues with diabetes; high blood pressure and gout in addition to his pulmonary condition.  He reports sleeping "great" and having a good appetite.  He is typically in a positive mood and denies any history or current symptoms of depression or anxiety.  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  Jeremiah Gibson's goals for this program are to be more consistent in exercise and increase his stamina and strength.  He plans to go to [place] with his spouse in October and would like to be more fit to enjoy this trip.  Staff will follow with Evette Doffing throughout the course of this program.       Expected Outcomes  Short:  Jaylend will get back into a routine of exercise to become more consistent and to increase his stamina and strength.   Long:  Jeremiah Gibson will make exercise a healthy lifestyle for himself.    Continue Psychosocial Services   Follow up required by staff       Psychosocial Re-Evaluation: Psychosocial Re-Evaluation    [place] Name 12/17/17 1639 01/23/18 1047 02/13/18 1428  Psychosocial Re-Evaluation   Current issues with  Current Stress Concerns  -  None Identified     Comments  Patient states that the only stress he is is when he gets short of breath. He has issues with going up hills and inclines. His wife of 7 years is doing well and they have a great marraige.  Counselor follow up with Dino reporting his trip to [place] was amazing and so enjoyable  with his spouse of 12 years.  He felt his stamina and strength were improved prior to going in order for him to be able to enjoy this trip as much.  He currently is aware that his condition is not one that will improve significantly - so exercise and medication will slow the progression - and that is his goal. Cashis reports not enjoying exercise but he does it for the purpose of maintaining his current stamina.  He continues to sleep well; be in a positive mood and has minimal stress in his life other than his health.  He reports his Dr. just increased his medications for his pulmonary condition yesterday and he is concerned about the side effects which typically are diarrhea.  His Dr. will monitor this.   Brendyn is happy that his daughter just recently moved within 1.5 hour drive vs 12 hours away - so he sees her more often and that is positive for he and his spouse.  He will continue his [facility] regimen [facility]  Staff will continue to follow.  Calyx states that he a a genuinely happy man and he has no problems to report. His wife is taking care of him and things are good. He is enjoying the program and wants to keep it up.     Expected Outcomes  Short: Attend [facility] stress management education to decrease stress. Long: Maintain exercise Post [facility] to keep stress at a minimum.  Short:  Jeremiah Gibson will continue to exercise to maintain his stamina and strength and slow the progression of his disease.   Long:  Jeremiah Gibson will continue to exercise for his health and to manage stress in his life.    Short: Attend [facility] stress management education to decrease stress. Long: Maintain exercise Post [facility] to keep stress at a minimum.     Interventions  Encouraged to attend Pulmonary Rehabilitation for the exercise  -  Encouraged to attend Pulmonary Rehabilitation for the exercise     Continue Psychosocial Services   Follow up required by staff  Follow up  required by staff  Follow up required by staff        Psychosocial Discharge (Final Psychosocial Re-Evaluation): Psychosocial Re-Evaluation - 02/13/18 1428      Psychosocial Re-Evaluation   Current issues with  None Identified    Comments  Jeremiah Gibson states that he a a genuinely happy man and he has no problems to report. His wife is taking care of him and things are good. He is enjoying the program and wants to keep it up.    Expected Outcomes  Short: Attend [facility] stress management education to decrease stress. Long: Maintain exercise Post [facility] to keep stress at a minimum.    Interventions  Encouraged to attend Pulmonary Rehabilitation for the exercise    Continue Psychosocial Services   Follow up required by staff       Education: Education Goals: Education classes will be provided on a weekly basis, covering required topics. Participant will state understanding/return  demonstration of topics presented.  Learning Barriers/Preferences: Learning Barriers/Preferences - 11/13/17 1104      Learning Barriers/Preferences   Learning Barriers  None    Learning Preferences  Group Instruction;Individual Instruction;Pictoral;Skilled Demonstration;Verbal Instruction;Video;Written Material       Education Topics:  Initial Evaluation Education: - Verbal, written and demonstration of respiratory meds, oximetry and breathing techniques. Instruction on use of nebulizers and MDIs and importance of monitoring MDI activations.   Pulmonary Rehab from 02/15/2018 in [facility] Cardiac and Pulmonary Rehab  Date  11/13/17  Educator  [facility]  Instruction Review Code  1- Verbalizes Understanding      General Nutrition Guidelines/Fats and Fiber: -Group instruction provided by verbal, written material, models and posters to present the general guidelines for heart healthy nutrition. Gives an explanation and review of dietary fats and fiber.   Pulmonary Rehab from 02/15/2018 in [facility] Cardiac and Pulmonary Rehab   Date  01/16/18  Educator  [facility]  Instruction Review Code  5- Refused Teaching      Controlling Sodium/Reading Food Labels: -Group verbal and written material supporting the discussion of sodium use in heart healthy nutrition. Review and explanation with models, verbal and written materials for utilization of the food label.   Pulmonary Rehab from 02/15/2018 in [facility] Cardiac and Pulmonary Rehab  Date  01/30/18  Educator  [facility]  Instruction Review Code  1- Verbalizes Understanding      Exercise Physiology & General Exercise Guidelines: - Group verbal and written instruction with models to review the exercise physiology of the cardiovascular system and associated critical values. Provides general exercise guidelines with specific guidelines to those with heart or lung disease.    Pulmonary Rehab from 02/15/2018 in [facility] Cardiac and Pulmonary Rehab  Date  02/15/18  Educator  [facility]  Instruction Review Code  1- Verbalizes Understanding      Aerobic Exercise & Resistance Training: - Gives group verbal and written instruction on the various components of exercise. Focuses on aerobic and resistive training programs and the benefits of this training and how to safely progress through these programs.   Pulmonary Rehab from 02/15/2018 in [facility] Cardiac and Pulmonary Rehab  Date  12/19/17  Educator  [facility]  Instruction Review Code  1- Verbalizes Understanding      Flexibility, Balance, Mind/Body Relaxation: Provides group verbal/written instruction on the benefits of flexibility and balance training, including mind/body exercise modes such as yoga, pilates and tai chi.  Demonstration and skill practice provided.   Stress and Anxiety: - Provides group verbal and written instruction about the health risks of elevated stress and causes of high stress.  Discuss the correlation between heart/lung disease and anxiety and treatment options. Review healthy ways to manage with stress and anxiety.   Pulmonary Rehab  from 02/15/2018 in [facility] Cardiac and Pulmonary Rehab  Date  01/23/18  Educator  [facility]  Instruction Review Code  1- Verbalizes Understanding      Depression: - Provides group verbal and written instruction on the correlation between heart/lung disease and depressed mood, treatment options, and the stigmas associated with seeking treatment.   Exercise & Equipment Safety: - Individual verbal instruction and demonstration of equipment use and safety with use of the equipment.   Pulmonary Rehab from 02/15/2018 in [facility] Cardiac and Pulmonary Rehab  Date  11/13/17  Educator  [facility]  Instruction Review Code  1- Verbalizes Understanding      Infection Prevention: - Provides verbal and written material to individual with discussion of infection control including proper hand  washing and proper equipment cleaning during exercise session.   Pulmonary Rehab from 02/15/2018 in [facility] Cardiac and Pulmonary Rehab  Date  11/13/17  Educator  [facility]  Instruction Review Code  1- Verbalizes Understanding      Falls Prevention: - Provides verbal and written material to individual with discussion of falls prevention and safety.   Pulmonary Rehab from 02/15/2018 in [facility] Cardiac and Pulmonary Rehab  Date  11/13/17  Educator  [facility]  Instruction Review Code  1- Verbalizes Understanding      Diabetes: - Individual verbal and written instruction to review signs/symptoms of diabetes, desired ranges of glucose level fasting, after meals and with exercise. Advice that pre and post exercise glucose checks will be done for 3 sessions at entry of program.   Pulmonary Rehab from 04/19/2016 in [facility] Cardiac and Pulmonary Rehab  Date  11/29/15  Educator  [facility]  Instruction Review Code (retired)  2- meets goals/outcomes      Chronic Lung Diseases: - Group verbal and written instruction to review updates, respiratory medications, advancements in procedures and treatments. Discuss use of supplemental oxygen including available portable  oxygen systems, continuous and intermittent flow rates, concentrators, personal use and safety guidelines. Review proper use of inhaler and spacers. Provide informative websites for self-education.    Pulmonary Rehab from 02/15/2018 in [facility] Cardiac and Pulmonary Rehab  Date  02/13/18  Educator  [facility]  Instruction Review Code  1- Verbalizes Understanding      Energy Conservation: - Provide group verbal and written instruction for methods to conserve energy, plan and organize activities. Instruct on pacing techniques, use of adaptive equipment and posture/positioning to relieve shortness of breath.   Pulmonary Rehab from 02/15/2018 in [facility] Cardiac and Pulmonary Rehab  Date  01/09/18  Educator  [facility]  Instruction Review Code  1- Verbalizes Understanding      Triggers and Exacerbations: - Group verbal and written instruction to review types of environmental triggers and ways to prevent exacerbations. Discuss weather changes, air quality and the benefits of nasal washing. Review warning signs and symptoms to help prevent infections. Discuss techniques for effective airway clearance, coughing, and vibrations.   Pulmonary Rehab from 02/15/2018 in [facility] Cardiac and Pulmonary Rehab  Date  12/12/17  Educator  [facility]  Instruction Review Code  1- Verbalizes Understanding      AED/CPR: - Group verbal and written instruction with the use of models to demonstrate the basic use of the AED with the basic ABC's of resuscitation.   Pulmonary Rehab from 02/15/2018 in [facility] Cardiac and Pulmonary Rehab  Date  01/18/18  Educator  [facility]  Instruction Review Code  1- Actuary and Physiology of the Lungs: - Group verbal and written instruction with the use of models to provide basic lung anatomy and physiology related to function, structure and complications of lung disease.   Pulmonary Rehab from 02/15/2018 in [facility] Cardiac and Pulmonary Rehab  Date  01/25/18  Educator  [facility]  Instruction Review  Code  1- Verbalizes Understanding      Anatomy & Physiology of the Heart: - Group verbal and written instruction and models provide basic cardiac anatomy and physiology, with the coronary electrical and arterial systems. Review of Valvular disease and Heart Failure   Cardiac Medications: - Group verbal and written instruction to review commonly prescribed medications for heart disease. Reviews the medication, class of the drug, and side effects.   Pulmonary Rehab from 04/19/2016 in [facility] Cardiac and Pulmonary  Rehab  Date  12/24/15  Educator  [facility]  Instruction Review Code (retired)  2- meets goals/outcomes      Know Your Numbers and Risk Factors: -Group verbal and written instruction about important numbers in your health.  Discussion of what are risk factors and how they play a role in the disease process.  Review of Cholesterol, Blood Pressure, Diabetes, and BMI and the role they play in your overall health.   Pulmonary Rehab from 02/15/2018 in [facility] Cardiac and Pulmonary Rehab  Date  12/07/17  Educator  [facility]  Instruction Review Code  1- Verbalizes Understanding      Sleep Hygiene: -Provides group verbal and written instruction about how sleep can affect your health.  Define sleep hygiene, discuss sleep cycles and impact of sleep habits. Review good sleep hygiene tips.    Other: -Provides group and verbal instruction on various topics (see comments)    Knowledge Questionnaire Score: Knowledge Questionnaire Score - 02/18/18 1044      Knowledge Questionnaire Score   Pre Score  15/18    Post Score  17/18   reviewed with patient       Core Components/Risk Factors/Patient Goals at Admission: Personal Goals and Risk Factors at Admission - 11/13/17 1119      Core Components/Risk Factors/Patient Goals on Admission    Weight Management  Yes;Weight Maintenance    Intervention  Weight Management/Obesity: Establish reasonable short term and long term weight goals.;Weight Management:  Provide education and appropriate resources to help participant work on and attain dietary goals.;Weight Management: Develop a combined nutrition and exercise program designed to reach desired caloric intake, while maintaining appropriate intake of nutrient and fiber, sodium and fats, and appropriate energy expenditure required for the weight goal.    Admit Weight  197 lb 11.2 oz (89.7 kg)    Goal Weight: Short Term  195 lb (88.5 kg)    Goal Weight: Long Term  195 lb (88.5 kg)    Expected Outcomes  Short Term: Continue to assess and modify interventions until short term weight is achieved;Long Term: Adherence to nutrition and physical activity/exercise program aimed toward attainment of established weight goal;Weight Maintenance: Understanding of the daily nutrition guidelines, which includes 25-35% calories from fat, 7% or less cal from saturated fats, less than 270m cholesterol, less than 1.5gm of sodium, & 5 or more servings of fruits and vegetables daily;Understanding recommendations for meals to include 15-35% energy as protein, 25-35% energy from fat, 35-60% energy from carbohydrates, less than 2053mof dietary cholesterol, 20-35 gm of total fiber daily;Understanding of distribution of calorie intake throughout the day with the consumption of 4-5 meals/snacks    Improve shortness of breath with ADL's  Yes    Intervention  Provide education, individualized exercise plan and daily activity instruction to help decrease symptoms of SOB with activities of daily living.    Expected Outcomes  Short Term: Improve cardiorespiratory fitness to achieve a reduction of symptoms when performing ADLs;Long Term: Be able to perform more ADLs without symptoms or delay the onset of symptoms    Diabetes  Yes    Intervention  Provide education about signs/symptoms and action to take for hypo/hyperglycemia.;Provide education about proper nutrition, including hydration, and aerobic/resistive exercise prescription along  with prescribed medications to achieve blood glucose in normal ranges: Fasting glucose 65-99 mg/dL    Expected Outcomes  Short Term: Participant verbalizes understanding of the signs/symptoms and immediate care of hyper/hypoglycemia, proper foot care and importance of medication, aerobic/resistive exercise and nutrition  plan for blood glucose control.;Long Term: Attainment of HbA1C < 7%.    Hypertension  Yes    Intervention  Provide education on lifestyle modifcations including regular physical activity/exercise, weight management, moderate sodium restriction and increased consumption of fresh fruit, vegetables, and low fat dairy, alcohol moderation, and smoking cessation.;Monitor prescription use compliance.    Expected Outcomes  Short Term: Continued assessment and intervention until BP is < 140/39m HG in hypertensive participants. < 130/837mHG in hypertensive participants with diabetes, heart failure or chronic kidney disease.;Long Term: Maintenance of blood pressure at goal levels.    Lipids  Yes    Intervention  Provide education and support for participant on nutrition & aerobic/resistive exercise along with prescribed medications to achieve LDL <7047mHDL >69m27m  Expected Outcomes  Short Term: Participant states understanding of desired cholesterol values and is compliant with medications prescribed. Participant is following exercise prescription and nutrition guidelines.;Long Term: Cholesterol controlled with medications as prescribed, with individualized exercise RX and with personalized nutrition plan. Value goals: LDL < 70mg30mL > 40 mg.       Core Components/Risk Factors/Patient Goals Review:  Goals and Risk Factor Review    Row Name 12/17/17 1647 01/25/18 1030 02/13/18 1435         Core Components/Risk Factors/Patient Goals Review   Personal Goals Review  Weight Management/Obesity;Improve shortness of breath with ADL's;Lipids;Hypertension;Diabetes  Weight  Management/Obesity;Improve shortness of breath with ADL's;Lipids;Hypertension;Diabetes  Weight Management/Obesity;Improve shortness of breath with ADL's;Hypertension;Diabetes     Review  [place]. His blood sugar has been within normal limits. He wants to "keep moving" as he gets older and wants to be able to get around the house and do things with his wife. His blood pressure has been under control with it being 130s/70s.  V[facility]ona[facility]eneding [facility] regularly. He has been walking more laps walking and has been improving with his shortness of breath. He is still wants to keep moving and will exercise when he is done with the program.  [place] has been doing better since the start of the program. He manages his shortness of breath well. He checks his blood glucose at home and has been in good range for him. He knows he needs to keep exercising to keep up with his ADL's. VinceJedediahnot lost weight since the start of the program but has increased in stamina. He states he could lose a few more pounds before the end of the program.     Expected Outcomes  Short: Attend [facility] regularly to improve shortness of breath with ADL's. Long: maintain independence with ADL's   Short: graduate [facility]. Long: maintain exercise to make ADL's easier.  Short: Lose 5 pounds. Long: maintain and continue weight loss.        Core Components/Risk Factors/Patient Goals at Discharge (Final Review):  Goals and Risk Factor Review - 02/13/18 1435      Core Components/Risk Factors/Patient Goals Review   Personal Goals Review  Weight Management/Obesity;Improve shortness of breath with ADL's;Hypertension;Diabetes    Review  V[place]ers[place] has been doing better since the start of the program. He manages his shortness of breath well. He checks his blood glucose at home and has  been in good range for him. He knows he needs to keep exercising to keep up with his ADL's. VinceTytannot  lost weight since the start of the program but has increased in stamina. He states he could lose a few more pounds before the end of the program.    Expected Outcomes  Short: Lose 5 pounds. Long: maintain and continue weight loss.       ITP Comments: ITP Comments    Row Name 11/13/17 1042 12/03/17 0820 12/31/17 0824 01/02/18 1250 02/25/18 0819   ITP Comments  Medical Evaluation completed. Chart sent for review and changes to Dr. Emily Filbert Director of [place]. Diagnosis can be found in [facility] encounter 09/26/17  30 day review completed. ITP sent to Dr. Emily Filbert Director of [place]. Continue with ITP unless changes are made by physician  30 day review completed. ITP sent to Dr. Emily Filbert Director of [place]. Continue with ITP unless changes are made by physician.  [employer] has been on vacation since last review.    30 day review completed. ITP sent to Dr. Emily Filbert Director of [place]. Continue with ITP unless changes are made by physician.      Comments: 30 day review

## 2018-02-27 ENCOUNTER — Encounter: Payer: Medicare Other | Admitting: *Deleted

## 2018-02-27 DIAGNOSIS — J849 Interstitial pulmonary disease, unspecified: Secondary | ICD-10-CM | POA: Diagnosis not present

## 2018-02-27 DIAGNOSIS — J8410 Pulmonary fibrosis, unspecified: Secondary | ICD-10-CM

## 2018-02-27 DIAGNOSIS — J84112 Idiopathic pulmonary fibrosis: Secondary | ICD-10-CM | POA: Diagnosis not present

## 2018-02-27 NOTE — Patient Instructions (Signed)
Discharge Patient Instructions  Patient Details  Name: Jeremiah Gibson MRN: [id number] Date of Birth: 07/31/1935 Referring Provider:  Rigoberto Noel, MD   Number of Visits: 10  Reason for Discharge:  Patient reached a stable level of exercise. Patient independent in their exercise. Patient has met program and personal goals.  Smoking History:  Social History   Tobacco Use  Smoking Status Former Smoker  . Packs/day: 2.00  . Years: 20.00  . Pack years: 40.00  . Types: Cigarettes, Pipe  . Last attempt to quit: 11/22/1999  . Years since quitting: 18.2  Smokeless Tobacco Never Used  Tobacco Comment   quit cigs in 70 and pipe in 01    Diagnosis:  Interstitial lung disease ([place])  Pulmonary fibrosis ([place])  Initial Exercise Prescription: Initial Exercise Prescription - 11/13/17 1200      Date of Initial Exercise RX and Referring Provider   Date  11/13/17    Referring Provider  Alba      Recumbant Bike   Level  2    RPM  60    Watts  10    Minutes  15    METs  2.4      T5 Nustep   Level  2    SPM  80    Minutes  15    METs  2.4      Track   Laps  30    Minutes  15    METs  2.4      Prescription Details   Frequency (times per week)  3    Duration  Progress to 45 minutes of aerobic exercise without signs/symptoms of physical distress      Intensity   THRR 40-80% of Max Heartrate  105-128    Ratings of Perceived Exertion  11-15    Perceived Dyspnea  0-4      Resistance Training   Training Prescription  Yes    Weight  3 lb    Reps  10-15       Discharge Exercise Prescription (Final Exercise Prescription Changes): Exercise Prescription Changes - 02/26/18 1100      Response to Exercise   Blood Pressure (Admit)  136/70    Blood Pressure (Exit)  138/78    Heart Rate (Admit)  88 bpm    Heart Rate (Exercise)  87 bpm    Heart Rate (Exit)  70 bpm    Oxygen Saturation (Admit)  94 %    Oxygen Saturation (Exercise)  94 %    Oxygen Saturation (Exit)  97 %     Rating of Perceived Exertion (Exercise)  13    Perceived Dyspnea (Exercise)  2    Symptoms  none    Duration  Continue with 45 min of aerobic exercise without signs/symptoms of physical distress.    Intensity  THRR unchanged   correct RPE range     Progression   Progression  Continue to progress workloads to maintain intensity without signs/symptoms of physical distress.    Average METs  2.4      Resistance Training   Training Prescription  Yes    Weight  3 lb    Reps  10-15      Interval Training   Interval Training  No      Recumbant Bike   Level  3    RPM  60    Watts  19    Minutes  15    METs  2.7  T5 Nustep   Level  5    SPM  80    Minutes  15    METs  2      Track   Laps  34    Minutes  15    METs  2.5       Functional Capacity: 6 Minute Walk    Row Name 11/13/17 1201         6 Minute Walk   Phase  Initial     Distance  1300 feet     Walk Time  6 minutes     # of Rest Breaks  0     MPH  2.46     METS  2.8     RPE  11     Perceived Dyspnea   1     VO2 Peak  9.8     Symptoms  No     Resting HR  82 bpm     Resting BP  124/64     Resting Oxygen Saturation   96 %     Exercise Oxygen Saturation  during 6 min walk  94 %     Max Ex. HR  108 bpm     Max Ex. BP  174/84     2 Minute Post BP  144/64       Interval HR   1 Minute HR  87     2 Minute HR  96     3 Minute HR  96     4 Minute HR  104     5 Minute HR  108     6 Minute HR  103     2 Minute Post HR  90     Interval Heart Rate?  Yes       Interval Oxygen   Interval Oxygen?  Yes     Baseline Oxygen Saturation %  96 %     1 Minute Oxygen Saturation %  94 %     1 Minute Liters of Oxygen  0 L     2 Minute Oxygen Saturation %  94 %     2 Minute Liters of Oxygen  0 L     3 Minute Oxygen Saturation %  94 %     3 Minute Liters of Oxygen  0 L     4 Minute Oxygen Saturation %  95 %     4 Minute Liters of Oxygen  0 L     5 Minute Oxygen Saturation %  94 %     5 Minute Liters of  Oxygen  0 L     6 Minute Oxygen Saturation %  94 %     6 Minute Liters of Oxygen  0 L     2 Minute Post Oxygen Saturation %  97 %     2 Minute Post Liters of Oxygen  0 L        Quality of Life:   Personal Goals: Goals established at orientation with interventions provided to work toward goal. Personal Goals and Risk Factors at Admission - 11/13/17 1119      Core Components/Risk Factors/Patient Goals on Admission    Weight Management  Yes;Weight Maintenance    Intervention  Weight Management/Obesity: Establish reasonable short term and long term weight goals.;Weight Management: Provide education and appropriate resources to help participant work on and attain dietary goals.;Weight Management: Develop a combined nutrition and exercise program designed to reach desired caloric intake,  while maintaining appropriate intake of nutrient and fiber, sodium and fats, and appropriate energy expenditure required for the weight goal.    Admit Weight  197 lb 11.2 oz (89.7 kg)    Goal Weight: Short Term  195 lb (88.5 kg)    Goal Weight: Long Term  195 lb (88.5 kg)    Expected Outcomes  Short Term: Continue to assess and modify interventions until short term weight is achieved;Long Term: Adherence to nutrition and physical activity/exercise program aimed toward attainment of established weight goal;Weight Maintenance: Understanding of the daily nutrition guidelines, which includes 25-35% calories from fat, 7% or less cal from saturated fats, less than 213m cholesterol, less than 1.5gm of sodium, & 5 or more servings of fruits and vegetables daily;Understanding recommendations for meals to include 15-35% energy as protein, 25-35% energy from fat, 35-60% energy from carbohydrates, less than 2062mof dietary cholesterol, 20-35 gm of total fiber daily;Understanding of distribution of calorie intake throughout the day with the consumption of 4-5 meals/snacks    Improve shortness of breath with ADL's  Yes     Intervention  Provide education, individualized exercise plan and daily activity instruction to help decrease symptoms of SOB with activities of daily living.    Expected Outcomes  Short Term: Improve cardiorespiratory fitness to achieve a reduction of symptoms when performing ADLs;Long Term: Be able to perform more ADLs without symptoms or delay the onset of symptoms    Diabetes  Yes    Intervention  Provide education about signs/symptoms and action to take for hypo/hyperglycemia.;Provide education about proper nutrition, including hydration, and aerobic/resistive exercise prescription along with prescribed medications to achieve blood glucose in normal ranges: Fasting glucose 65-99 mg/dL    Expected Outcomes  Short Term: Participant verbalizes understanding of the signs/symptoms and immediate care of hyper/hypoglycemia, proper foot care and importance of medication, aerobic/resistive exercise and nutrition plan for blood glucose control.;Long Term: Attainment of HbA1C < 7%.    Hypertension  Yes    Intervention  Provide education on lifestyle modifcations including regular physical activity/exercise, weight management, moderate sodium restriction and increased consumption of fresh fruit, vegetables, and low fat dairy, alcohol moderation, and smoking cessation.;Monitor prescription use compliance.    Expected Outcomes  Short Term: Continued assessment and intervention until BP is < 140/9026mG in hypertensive participants. < 130/71m72m in hypertensive participants with diabetes, heart failure or chronic kidney disease.;Long Term: Maintenance of blood pressure at goal levels.    Lipids  Yes    Intervention  Provide education and support for participant on nutrition & aerobic/resistive exercise along with prescribed medications to achieve LDL <70mg52mL >40mg.48mExpected Outcomes  Short Term: Participant states understanding of desired cholesterol values and is compliant with medications prescribed.  Participant is following exercise prescription and nutrition guidelines.;Long Term: Cholesterol controlled with medications as prescribed, with individualized exercise RX and with personalized nutrition plan. Value goals: LDL < 70mg, 29m> 40 mg.        Personal Goals Discharge: Goals and Risk Factor Review - 02/13/18 1435      Core Components/Risk Factors/Patient Goals Review   Personal Goals Review  Weight Management/Obesity;Improve shortness of breath with ADL's;Hypertension;Diabetes    Review  Vincent[place]ke[place] has been doing better since the start of the program. He manages his shortness of breath well. He checks his blood glucose at home and has been in good range for him. He knows he needs to keep  exercising to keep up with his ADL's. Gram has not lost weight since the start of the program but has increased in stamina. He states he could lose a few more pounds before the end of the program.    Expected Outcomes  Short: Lose 5 pounds. Long: maintain and continue weight loss.       Exercise Goals and Review: Exercise Goals    Row Name 11/13/17 1200             Exercise Goals   Increase Physical Activity  Yes       Intervention  Provide advice, education, support and counseling about physical activity/exercise needs.;Develop an individualized exercise prescription for aerobic and resistive training based on initial evaluation findings, risk stratification, comorbidities and participant's personal goals.       Expected Outcomes  Short Term: Attend rehab on a regular basis to increase amount of physical activity.;Long Term: Add in home exercise to make exercise part of routine and to increase amount of physical activity.;Long Term: Exercising regularly at least 3-5 days a week.       Increase Strength and Stamina  Yes       Intervention  Provide advice, education, support and counseling about physical activity/exercise needs.;Develop an  individualized exercise prescription for aerobic and resistive training based on initial evaluation findings, risk stratification, comorbidities and participant's personal goals.       Expected Outcomes  Short Term: Increase workloads from initial exercise prescription for resistance, speed, and METs.;Short Term: Perform resistance training exercises routinely during rehab and add in resistance training at home;Long Term: Improve cardiorespiratory fitness, muscular endurance and strength as measured by increased METs and functional capacity (6MWT)       Able to understand and use rate of perceived exertion (RPE) scale  Yes       Intervention  Provide education and explanation on how to use RPE scale       Expected Outcomes  Short Term: Able to use RPE daily in rehab to express subjective intensity level;Long Term:  Able to use RPE to guide intensity level when exercising independently       Able to understand and use Dyspnea scale  Yes       Intervention  Provide education and explanation on how to use Dyspnea scale       Expected Outcomes  Short Term: Able to use Dyspnea scale daily in rehab to express subjective sense of shortness of breath during exertion;Long Term: Able to use Dyspnea scale to guide intensity level when exercising independently       Knowledge and understanding of Target Heart Rate Range (THRR)  Yes       Intervention  Provide education and explanation of THRR including how the numbers were predicted and where they are located for reference       Expected Outcomes  Short Term: Able to state/look up THRR;Short Term: Able to use daily as guideline for intensity in rehab;Long Term: Able to use THRR to govern intensity when exercising independently       Able to check pulse independently  Yes       Intervention  Provide education and demonstration on how to check pulse in carotid and radial arteries.;Review the importance of being able to check your own pulse for safety during  independent exercise       Expected Outcomes  Short Term: Able to explain why pulse checking is important during independent exercise;Long Term: Able to check pulse independently and accurately  Understanding of Exercise Prescription  Yes       Intervention  Provide education, explanation, and written materials on patient's individual exercise prescription       Expected Outcomes  Short Term: Able to explain program exercise prescription;Long Term: Able to explain home exercise prescription to exercise independently          Exercise Goals Re-Evaluation: Exercise Goals Re-Evaluation    Row Name 11/19/17 1016 11/23/17 1044 12/05/17 1224 12/19/17 1136 01/16/18 1239     Exercise Goal Re-Evaluation   Exercise Goals Review  Increase Physical Activity;Increase Strength and Stamina;Able to understand and use rate of perceived exertion (RPE) scale;Knowledge and understanding of Target Heart Rate Range (THRR);Understanding of Exercise Prescription;Able to understand and use Dyspnea scale  Increase Physical Activity;Able to understand and use rate of perceived exertion (RPE) scale;Knowledge and understanding of Target Heart Rate Range (THRR);Increase Strength and Stamina;Able to understand and use Dyspnea scale  Increase Physical Activity;Increase Strength and Stamina;Able to understand and use rate of perceived exertion (RPE) scale;Able to understand and use Dyspnea scale  Increase Physical Activity;Increase Strength and Stamina;Able to understand and use rate of perceived exertion (RPE) scale;Able to understand and use Dyspnea scale  Increase Physical Activity;Increase Strength and Stamina;Able to understand and use rate of perceived exertion (RPE) scale;Able to understand and use Dyspnea scale;Knowledge and understanding of Target Heart Rate Range (THRR);Understanding of Exercise Prescription   Comments  Reviewed RPE scale, THR and program prescription with pt today.  Pt voiced understanding and was  given a copy of goals to take home.   Reviewed home exercise with pt today.  Pt plans to go to [facility] for exercise.  Reviewed THR, pulse, RPE, sign and symptoms, NTG use, and when to call 911 or MD.  Also discussed weather considerations and indoor options.  Pt voiced understanding.  Ryane tolerates exercise well.  He walks consistently 34-37 laps around the track.  Jyden is graudally increasing laps walked and overall METs.  He will miss a couple weeks going to [place] for his anniversary.  Zaylen has maintained fitness during his vacation in October. Staff will encourage increasing weight strength training and on machines.    Expected Outcomes  Short: Use RPE daily to regulate intensity. Long: Follow program prescription in THR.  Short - exercise 3 days per week between LW and home Long - exercise 3 days on his own   Short - continue to attend consistently, consider 4 lb for strength work Long - increase from current MET level  Short - exercise while on vacation Long - increase MET level  Short - inc to 4 lb for strength work Long - increase MET level   [place] Name 01/30/18 1153 02/13/18 1220 02/26/18 1129         Exercise Goal Re-Evaluation   Exercise Goals Review  Increase Physical Activity;Increase Strength and Stamina;Able to understand and use rate of perceived exertion (RPE) scale;Able to understand and use Dyspnea scale;Knowledge and understanding of Target Heart Rate Range (THRR);Understanding of Exercise Prescription  Increase Physical Activity;Increase Strength and Stamina;Able to understand and use rate of perceived exertion (RPE) scale;Able to understand and use Dyspnea scale;Knowledge and understanding of Target Heart Rate Range (THRR);Understanding of Exercise Prescription  Increase Physical Activity;Increase Strength and Stamina;Able to understand and use rate of perceived exertion (RPE) scale;Able to understand and use Dyspnea scale;Knowledge and understanding of Target Heart Rate Range  (THRR);Understanding of Exercise Prescription     Comments  Diron has increased level on T5 NS.  Overall METS slightly increased.  Staff will monitor progress.  Hanz works consistently at [employer].  He has increased endurance walking.  Staff will encourage increase of weight to 4 lb  Maxwell is maintaining MET level.  He works in correct RPE level and does well with 45 min of cardio.     Expected Outcomes  Short - attend consistently Long - increase MET level  SHort - increase to 4 lb strength training Long - increase MET level  Short - complete LW program Long - maintain exercise on his own        Nutrition & Weight - Outcomes: Pre Biometrics - 11/13/17 1157      Pre Biometrics   Height  5' 10.5" (1.791 m)    Weight  197 lb 11.2 oz (89.7 kg)    Waist Circumference  42.5 inches    Hip Circumference  43 inches    Waist to Hip Ratio  0.99 %    BMI (Calculated)  27.96        Nutrition: Nutrition Therapy & Goals - 11/19/17 1043      Nutrition Therapy   RD appointment deferred  Yes   reports his wife to be monitoring his diet      Nutrition Discharge: Nutrition Assessments - 11/13/17 1100      MEDFICTS Scores   Pre Score  33       Education Questionnaire Score: Knowledge Questionnaire Score - 02/18/18 1044      Knowledge Questionnaire Score   Pre Score  15/18    Post Score  17/18   reviewed with patient      Goals reviewed with patient; copy given to patient.

## 2018-02-27 NOTE — Progress Notes (Signed)
Daily Session Note  Patient Details  Name: Meredith Mells MRN: [id number] Date of Birth: Jan 07, 1936 Referring Provider:     Pulmonary Rehab from 11/13/2017 in [facility]  Referring Provider  Alba      Encounter Date: 02/27/2018  Check In: Session Check In - 02/27/18 1000      Check-In   Supervising physician immediately available to respond to emergencies  [facility] immediately available ER MD    Physician(s)  Drs. Alyse Low    Location  [facility]    Staff Present  Jasper Loser BS, Exercise Physiologist;Jessica Luan [place], [place], RCEP, CCRP, Exercise Physiologist;Joseph Tessie Fass RCP,RRT,BSRT    Medication changes reported      No    Fall or balance concerns reported     No    Warm-up and Cool-down  Performed as group-led instruction    Resistance Training Performed  Yes    VAD Patient?  No    PAD/SET Patient?  No      Pain Assessment   Currently in Pain?  No/denies          Social History   Tobacco Use  Smoking Status Former Smoker  . Packs/day: 2.00  . Years: 20.00  . Pack years: 40.00  . Types: Cigarettes, Pipe  . Last attempt to quit: 11/22/1999  . Years since quitting: 18.2  Smokeless Tobacco Never Used  Tobacco Comment   quit cigs in 72 and pipe in 01    Goals Met:  Proper associated with RPD/PD & O2 Sat Independence with exercise equipment Using PLB without cueing & demonstrates good technique Exercise tolerated well No report of cardiac concerns or symptoms Strength training completed today  Goals Unmet:  Not Applicable  Comments: Pt able to follow exercise prescription today without complaint.  Will continue to monitor for progression.    Dr. Emily Filbert is Medical Director for [place] and [facility].

## 2018-03-01 ENCOUNTER — Encounter: Payer: Medicare Other | Admitting: *Deleted

## 2018-03-01 VITALS — Ht 70.5 in | Wt 196.0 lb

## 2018-03-01 DIAGNOSIS — J8410 Pulmonary fibrosis, unspecified: Secondary | ICD-10-CM

## 2018-03-01 DIAGNOSIS — J849 Interstitial pulmonary disease, unspecified: Secondary | ICD-10-CM

## 2018-03-01 DIAGNOSIS — J84112 Idiopathic pulmonary fibrosis: Secondary | ICD-10-CM | POA: Diagnosis not present

## 2018-03-01 NOTE — Progress Notes (Signed)
Pulmonary Individual Treatment Plan  Patient Details  Name: Franchot Pollitt MRN: [id number] Date of Birth: Aug 20, 1935 Referring Provider:     Pulmonary Rehab from 11/13/2017 in [facility] Cardiac and Pulmonary Rehab  Referring Provider  Alba      Initial Encounter Date:    Pulmonary Rehab from 11/13/2017 in [facility] Cardiac and Pulmonary Rehab  Date  11/13/17      Visit Diagnosis: Interstitial lung disease ([place])  Pulmonary fibrosis ([place])  Patient's Home Medications on Admission:  Current Outpatient Medications:  .  albuterol (VENTOLIN HFA) 108 (90 Base) MCG/ACT inhaler, Inhale 2 puffs into the lungs every 4 (four) hours as needed for wheezing or shortness of breath., Disp: 3 Inhaler, Rfl: 3 .  allopurinol (ZYLOPRIM) 100 MG tablet, TAKE 2 TABLETS BY MOUTH TWICE DAILY. GENERIC EQUIVALENT FOR ZYLOPRIM, Disp: 360 tablet, Rfl: 3 .  amLODipine (NORVASC) 2.5 MG tablet, Take 1 tablet (2.5 mg total) by mouth daily., Disp: 90 tablet, Rfl: 4 .  aspirin 81 MG tablet, Take 81 mg by mouth every evening. , Disp: , Rfl:  .  clopidogrel (PLAVIX) 75 MG tablet, TAKE 1 TABLET BY MOUTH DAILY GENERIC EQUIVALENT FOR PLAVIX, Disp: 90 tablet, Rfl: 4 .  finasteride (PROSCAR) 5 MG tablet, TAKE 1 TABLET BY MOUTH EVERY DAY. GENERIC EQUIVALENT FOR PROSCAR, Disp: 90 tablet, Rfl: 4 .  fish oil-omega-3 fatty acids 1000 MG capsule, Take 1 g by mouth 2 (two) times daily. , Disp: , Rfl:  .  fluticasone (FLONASE) 50 MCG/ACT nasal spray, Place 2 sprays into both nostrils daily., Disp: 16 g, Rfl: 1 .  Fluticasone-Salmeterol (ADVAIR DISKUS) 250-50 MCG/DOSE AEPB, Inhale 1 puff into the lungs 2 (two) times daily., Disp: 3 each, Rfl: 5 .  glipiZIDE (GLUCOTROL XL) 5 MG 24 hr tablet, TAKE 1 TABLET BY MOUTH DAILY WITH BREAKFAST, Disp: 90 tablet, Rfl: 4 .  glucose blood (ONE TOUCH ULTRA TEST) test strip, Use as instructed to check blood sugar daily. Dx: E11.21, Disp: 100 each, Rfl: 4 .  losartan (COZAAR) 100 MG tablet, TAKE 1 TABLET BY MOUTH  EVERY DAY FOR BLOOD PRESSURE, Disp: 90 tablet, Rfl: 3 .  Magnesium 400 MG TABS, Take 400 mg by mouth 2 (two) times daily. , Disp: , Rfl:  .  metFORMIN (GLUCOPHAGE) 500 MG tablet, TAKE 2 TABLETS BY MOUTH TWICE DAILY WITH A MEAL, Disp: 360 tablet, Rfl: 4 .  MULTIPLE VITAMIN PO, MULTIVITAMINS (Oral Tablet)  1 Every Day for 0 days  Quantity: 0.00;  Refills: 0   Ordered :06-Jan-2010  Doy Hutching ;  Started 22-Mar-2009 Active, Disp: , Rfl:  .  Nintedanib (OFEV) 100 MG CAPS, Take 2 capsules (200 mg total) by mouth 2 (two) times daily with a meal., Disp: 180 capsule, Rfl: 1 .  pantoprazole (PROTONIX) 20 MG tablet, Take 20 mg by mouth 2 (two) times daily., Disp: , Rfl:  .  polyethylene glycol-electrolytes (NULYTELY/GOLYTELY) 420 g solution, Take 4,000 mLs by mouth once., Disp: , Rfl:  .  potassium chloride SA (K-DUR,KLOR-CON) 20 MEQ tablet, TAKE 1 TABLET BY MOUTH DAILY, Disp: 90 tablet, Rfl: 4 .  pravastatin (PRAVACHOL) 40 MG tablet, TAKE 1 TABLET BY MOUTH DAILY, Disp: 90 tablet, Rfl: 4 .  terazosin (HYTRIN) 5 MG capsule, TAKE 1 CAPSULE BY MOUTH EVERY DAY FOR PROSTATE, Disp: 90 capsule, Rfl: 4 .  triamterene-hydrochlorothiazide (MAXZIDE-25) 37.5-25 MG tablet, TAKE 1 TABLET BY MOUTH EVERY DAY FOR BLOOD PRESSURE GENERIC EQUIVALENT FOR MAXZIDE-25, Disp: 90 tablet, Rfl: 4  Past Medical History: Past  Medical History:  Diagnosis Date  . Anemia   . Arthritis   . Asthma   . Colon polyps   . Cough    CHRONIC  . Diabetes mellitus without complication ([place])   . Diverticulosis of colon (without mention of hemorrhage)   . Dyspnea    pulmonary fibrosis  . Dysrhythmia   . Esophageal reflux   . Gout   . Hyperlipidemia   . Hypertension   . Impotence of organic origin   . Leg cramps   . Pulmonary fibrosis ([place])   . Pulmonary fibrosis ([place])   . Wheezing     Tobacco Use: Social History   Tobacco Use  Smoking Status Former Smoker  . Packs/day: 2.00  . Years: 20.00  . Pack years: 40.00  . Types:  Cigarettes, Pipe  . Last attempt to quit: 11/22/1999  . Years since quitting: 18.2  Smokeless Tobacco Never Used  Tobacco Comment   quit cigs in 70 and pipe in 01    Labs: Recent Review Flowsheet Data    Labs for ITP Cardiac and Pulmonary Rehab Latest Ref Rng & Units 11/21/2016 01/30/2017 03/27/2017 07/23/2017 11/26/2017   Cholestrol 100 - 199 mg/dL - 156 - - 146   LDLCALC 0 - 99 mg/dL - 90 - - 80   HDL >39 mg/dL - 45 - - 45   Trlycerides 0 - 149 mg/dL - 110 - - 104   Hemoglobin A1c 4.0 - 5.6 % 7.3 - 6.7 7.0 6.7(A)       Pulmonary Assessment Scores: Pulmonary Assessment Scores    Row Name 11/13/17 1058 02/18/18 1044       ADL UCSD   ADL Phase  Entry  Exit    SOB Score total  10  18    Rest  0  0    Walk  0  1    Stairs  0  3    Bath  0  0    Dress  0  1    Shop  0  0      CAT Score   CAT Score  9  13      mMRC Score   mMRC Score  1  -       Pulmonary Function Assessment: Pulmonary Function Assessment - 11/13/17 1121      Breath   Bilateral Breath Sounds  Clear;Decreased    Shortness of Breath  Yes       Exercise Target Goals: Exercise Program Goal: Individual exercise prescription set using results from initial 6 min walk test and THRR while considering  patient's activity barriers and safety.   Exercise Prescription Goal: Initial exercise prescription builds to 30-45 minutes a day of aerobic activity, 2-3 days per week.  Home exercise guidelines will be given to patient during program as part of exercise prescription that the participant will acknowledge.  Activity Barriers & Risk Stratification:   6 Minute Walk: 6 Minute Walk    Row Name 11/13/17 1201         6 Minute Walk   Phase  Initial     Distance  1300 feet     Walk Time  6 minutes     # of Rest Breaks  0     MPH  2.46     METS  2.8     RPE  11     Perceived Dyspnea   1     VO2 Peak  9.8  Symptoms  No     Resting HR  82 bpm     Resting BP  124/64     Resting Oxygen Saturation   96  %     Exercise Oxygen Saturation  during 6 min walk  94 %     Max Ex. HR  108 bpm     Max Ex. BP  174/84     2 Minute Post BP  144/64       Interval HR   1 Minute HR  87     2 Minute HR  96     3 Minute HR  96     4 Minute HR  104     5 Minute HR  108     6 Minute HR  103     2 Minute Post HR  90     Interval Heart Rate?  Yes       Interval Oxygen   Interval Oxygen?  Yes     Baseline Oxygen Saturation %  96 %     1 Minute Oxygen Saturation %  94 %     1 Minute Liters of Oxygen  0 L     2 Minute Oxygen Saturation %  94 %     2 Minute Liters of Oxygen  0 L     3 Minute Oxygen Saturation %  94 %     3 Minute Liters of Oxygen  0 L     4 Minute Oxygen Saturation %  95 %     4 Minute Liters of Oxygen  0 L     5 Minute Oxygen Saturation %  94 %     5 Minute Liters of Oxygen  0 L     6 Minute Oxygen Saturation %  94 %     6 Minute Liters of Oxygen  0 L     2 Minute Post Oxygen Saturation %  97 %     2 Minute Post Liters of Oxygen  0 L       Oxygen Initial Assessment: Oxygen Initial Assessment - 11/13/17 1122      Home Oxygen   Home Oxygen Device  None    Sleep Oxygen Prescription  None    Home Exercise Oxygen Prescription  None    Home at Rest Exercise Oxygen Prescription  None      Initial 6 min Walk   Oxygen Used  None      Program Oxygen Prescription   Program Oxygen Prescription  None      Intervention   Short Term Goals  To learn and demonstrate proper use of respiratory medications;To learn and demonstrate proper pursed lip breathing techniques or other breathing techniques.;To learn and understand importance of maintaining oxygen saturations>88%;To learn and understand importance of monitoring SPO2 with pulse oximeter and demonstrate accurate use of the pulse oximeter.    Long  Term Goals  Maintenance of O2 saturations>88%;Exhibits proper breathing techniques, such as pursed lip breathing or other method taught during program session;Compliance with respiratory  medication;Demonstrates proper use of MDI's;Verbalizes importance of monitoring SPO2 with pulse oximeter and return demonstration       Oxygen Re-Evaluation: Oxygen Re-Evaluation    Row Name 11/19/17 1015 12/17/17 1636 01/07/18 1042 02/13/18 1425       Program Oxygen Prescription   Program Oxygen Prescription  None  None  None  None      Home Oxygen   Home Oxygen Device  None  None  None  None    Sleep Oxygen Prescription  None  None  None  None    Home Exercise Oxygen Prescription  None  None  None  None    Home at Rest Exercise Oxygen Prescription  None  None  None  None      Goals/Expected Outcomes   Short Term Goals  To learn and demonstrate proper use of respiratory medications;To learn and demonstrate proper pursed lip breathing techniques or other breathing techniques.;To learn and understand importance of maintaining oxygen saturations>88%;To learn and understand importance of monitoring SPO2 with pulse oximeter and demonstrate accurate use of the pulse oximeter.  To learn and demonstrate proper use of respiratory medications;To learn and demonstrate proper pursed lip breathing techniques or other breathing techniques.;To learn and understand importance of maintaining oxygen saturations>88%;To learn and understand importance of monitoring SPO2 with pulse oximeter and demonstrate accurate use of the pulse oximeter.  To learn and demonstrate proper use of respiratory medications;To learn and demonstrate proper pursed lip breathing techniques or other breathing techniques.;To learn and understand importance of maintaining oxygen saturations>88%;To learn and understand importance of monitoring SPO2 with pulse oximeter and demonstrate accurate use of the pulse oximeter.  To learn and demonstrate proper use of respiratory medications;To learn and demonstrate proper pursed lip breathing techniques or other breathing techniques.;To learn and understand importance of maintaining oxygen  saturations>88%;To learn and understand importance of monitoring SPO2 with pulse oximeter and demonstrate accurate use of the pulse oximeter.    Long  Term Goals  Maintenance of O2 saturations>88%;Exhibits proper breathing techniques, such as pursed lip breathing or other method taught during program session;Compliance with respiratory medication;Demonstrates proper use of MDI's;Verbalizes importance of monitoring SPO2 with pulse oximeter and return demonstration  Maintenance of O2 saturations>88%;Exhibits proper breathing techniques, such as pursed lip breathing or other method taught during program session;Compliance with respiratory medication;Demonstrates proper use of MDI's;Verbalizes importance of monitoring SPO2 with pulse oximeter and return demonstration  Maintenance of O2 saturations>88%;Exhibits proper breathing techniques, such as pursed lip breathing or other method taught during program session;Compliance with respiratory medication;Demonstrates proper use of MDI's;Verbalizes importance of monitoring SPO2 with pulse oximeter and return demonstration  Maintenance of O2 saturations>88%;Exhibits proper breathing techniques, such as pursed lip breathing or other method taught during program session;Compliance with respiratory medication;Demonstrates proper use of MDI's;Verbalizes importance of monitoring SPO2 with pulse oximeter and return demonstration    Comments  Reviewed PLB technique with pt.  Talked about how it work and it's important to maintaining his exercise saturations.    Patient states he gets short of breath mostly when he goes up hills and inclines. He does have a pulse oximeter to check his oxygen saturation at home. Informed him why it is important to check when he is exerting himself.  Patient states that his oxygen at home is stable around 95 percent. He checks it when he is at rest and on exertion. He has no questions about his home medications. He is taking medications for his  fibrosis and exercising so he does not get more short of breath down the road.  [facility].  Practiced PLB with patient. Patient understands why PLB is important and to use it when he is short of breath. Reviewed that oxygen saturations should be 88 percent and above. Patient has a pulse oximeter at home to check his oxygen.    Goals/Expected Outcomes  Short: Become more profiecient at using PLB.   Long: Become independent at using PLB.  Short: monitor oxygen at home with exertion. Long: maintain oxygen saturations above 88 percent independently.  Short: continue exercising at home and taking medications. Long: maintain exercise post [facility] to decrease oxygen needs.  Short: use PLB with exertion. Long: use PLB on exertion proficiently and independently.       Oxygen Discharge (Final Oxygen Re-Evaluation): Oxygen Re-Evaluation - 02/13/18 1425      Program Oxygen Prescription   Program Oxygen Prescription  None      Home Oxygen   Home Oxygen Device  None    Sleep Oxygen Prescription  None    Home Exercise Oxygen Prescription  None    Home at Rest Exercise Oxygen Prescription  None      Goals/Expected Outcomes   Short Term Goals  To learn and demonstrate proper use of respiratory medications;To learn and demonstrate proper pursed lip breathing techniques or other breathing techniques.;To learn and understand importance of maintaining oxygen saturations>88%;To learn and understand importance of monitoring SPO2 with pulse oximeter and demonstrate accurate use of the pulse oximeter.    Long  Term Goals  Maintenance of O2 saturations>88%;Exhibits proper breathing techniques, such as pursed lip breathing or other method taught during program session;Compliance with respiratory medication;Demonstrates proper use of MDI's;Verbalizes importance of monitoring SPO2 with pulse oximeter and return demonstration    Comments  Practiced  PLB with patient. Patient understands why PLB is important and to use it when he is short of breath. Reviewed that oxygen saturations should be 88 percent and above. Patient has a pulse oximeter at home to check his oxygen.    Goals/Expected Outcomes  Short: use PLB with exertion. Long: use PLB on exertion proficiently and independently.       Initial Exercise Prescription: Initial Exercise Prescription - 11/13/17 1200      Date of Initial Exercise RX and Referring Provider   Date  11/13/17    Referring Provider  Alba      Recumbant Bike   Level  2    RPM  60    Watts  10    Minutes  15    METs  2.4      T5 Nustep   Level  2    SPM  80    Minutes  15    METs  2.4      Track   Laps  30    Minutes  15    METs  2.4      Prescription Details   Frequency (times per week)  3    Duration  Progress to 45 minutes of aerobic exercise without signs/symptoms of physical distress      Intensity   THRR 40-80% of Max Heartrate  105-128    Ratings of Perceived Exertion  11-15    Perceived Dyspnea  0-4      Resistance Training   Training Prescription  Yes    Weight  3 lb    Reps  10-15       Perform Capillary Blood Glucose checks as needed.  Exercise Prescription Changes:  Exercise Prescription Changes    Row Name 11/21/17 1500 11/23/17 1000 12/05/17 1200 12/19/17 1100 01/16/18 1200     Response to Exercise   Blood Pressure (Admit)  162/72  -  106/58  130/58  126/62   Blood Pressure (Exercise)  154/74  -  -  -  -   Blood  Pressure (Exit)  120/70  -  108/70  122/66  134/70   Heart Rate (Admit)  86 bpm  -  54 bpm  80 bpm  82 bpm   Heart Rate (Exercise)  91 bpm  -  88 bpm  111 bpm  94 bpm   Heart Rate (Exit)  75 bpm  -  70 bpm  84 bpm  72 bpm   Oxygen Saturation (Admit)  96 %  -  94 %  94 %  96 %   Oxygen Saturation (Exercise)  97 %  -  95 %  96 %  96 %   Oxygen Saturation (Exit)  97 %  -  98 %  92 %  98 %   Rating of Perceived Exertion (Exercise)  12  -  12  12  12     Perceived Dyspnea (Exercise)  2  -  2  2  2    Symptoms  none  -  none  none  none   Comments  second day  -  -  -  -   Duration  Progress to 45 minutes of aerobic exercise without signs/symptoms of physical distress  -  Continue with 45 min of aerobic exercise without signs/symptoms of physical distress.  Continue with 45 min of aerobic exercise without signs/symptoms of physical distress.  Continue with 45 min of aerobic exercise without signs/symptoms of physical distress.   Intensity  THRR New  -  THRR unchanged  THRR unchanged  THRR unchanged     Progression   Progression  Continue to progress workloads to maintain intensity without signs/symptoms of physical distress.  -  Continue to progress workloads to maintain intensity without signs/symptoms of physical distress.  Continue to progress workloads to maintain intensity without signs/symptoms of physical distress.  Continue to progress workloads to maintain intensity without signs/symptoms of physical distress.   Average METs  2  -  2.25  2.4  2.3     Resistance Training   Training Prescription  Yes  -  Yes  Yes  Yes   Weight  3 lb  -  3 lb  3 lb  3 lb   Reps  10-15  -  10-15  10-15  10-15     Interval Training   Interval Training  -  -  No  No  No     T5 Nustep   Level  3  -  3  4  4    SPM  80  -  80  80  80   Minutes  15  -  15  15  15    METs  1.9  -  1.9  2  2      Track   Laps  34  -  34  39  37   Minutes  15  -  15  15  15    METs  2.6  -  2.6  2.9  2.76     Home Exercise Plan   Plans to continue exercise at  [facility] (comment) [employer]  -  -  -   Frequency  -  Add 2 additional days to program exercise sessions.  -  -  -   Initial Home Exercises Provided  -  11/23/17  -  -  -   Row Name 01/30/18 1100 02/13/18 1200 02/26/18 1100         Response to Exercise   Blood Pressure (Admit)  120/74  128/72  136/70     Blood Pressure (Exit)  124/60  122/66  138/78     Heart Rate (Admit)  61 bpm  88 bpm  88 bpm      Heart Rate (Exercise)  88 bpm  91 bpm  87 bpm     Heart Rate (Exit)  75 bpm  75 bpm  70 bpm     Oxygen Saturation (Admit)  97 %  94 %  94 %     Oxygen Saturation (Exercise)  94 %  94 %  94 %     Oxygen Saturation (Exit)  98 %  98 %  97 %     Rating of Perceived Exertion (Exercise)  13  13  13      Perceived Dyspnea (Exercise)  2  2  2      Symptoms  none  none  none     Duration  Continue with 45 min of aerobic exercise without signs/symptoms of physical distress.  Continue with 45 min of aerobic exercise without signs/symptoms of physical distress.  Continue with 45 min of aerobic exercise without signs/symptoms of physical distress.     Intensity  THRR unchanged  THRR unchanged  THRR unchanged correct RPE range       Progression   Progression  Continue to progress workloads to maintain intensity without signs/symptoms of physical distress.  Continue to progress workloads to maintain intensity without signs/symptoms of physical distress.  Continue to progress workloads to maintain intensity without signs/symptoms of physical distress.     Average METs  2.4  2.46  2.4       Resistance Training   Training Prescription  Yes  Yes  Yes     Weight  3  3 lb  3 lb     Reps  10-15  10-15  10-15       Interval Training   Interval Training  No  No  No       Recumbant Bike   Level  2  2  3      RPM  60  60  60     Watts  13  25  19      Minutes  15  15  15      METs  1.9  2.88  2.7       T5 Nustep   Level  5  5  5      SPM  80  80  80     Minutes  15  15  15      METs  1.9  2  2        Track   Laps  -  33  34     Minutes  -  15  15     METs  -  2.5  2.5        Exercise Comments:  Exercise Comments    Row Name 11/19/17 1018 11/23/17 1043 03/01/18 1012       Exercise Comments   First full day of exercise!  Patient was oriented to gym and equipment including functions, settings, policies, and procedures.  Patient's individual exercise prescription and treatment plan were reviewed.  All  starting workloads were established based on the results of the 6 minute walk test done at initial orientation visit.  The plan for exercise progression was also introduced and progression will be customized based on patient's performance and goals.  Reviewed home exercise with pt today.  Pt plans to go to  [employer] for exercise.  Reviewed THR, pulse, RPE, sign and symptoms, NTG use, and when to call 911 or MD.  Also discussed weather considerations and indoor options.  Pt voiced understanding.  Chanz graduated today from  rehab with 33 sessions completed.  Details of the patient's exercise prescription and what He needs to do in order to continue the prescription and progress were discussed with patient.  Patient was given a copy of prescription and goals.  Patient verbalized understanding.  Jaxzen plans to continue to exercise by exercising at [facility].        Exercise Goals and Review:  Exercise Goals    Row Name 11/13/17 1200             Exercise Goals   Increase Physical Activity  Yes       Intervention  Provide advice, education, support and counseling about physical activity/exercise needs.;Develop an individualized exercise prescription for aerobic and resistive training based on initial evaluation findings, risk stratification, comorbidities and participant's personal goals.       Expected Outcomes  Short Term: Attend rehab on a regular basis to increase amount of physical activity.;Long Term: Add in home exercise to make exercise part of routine and to increase amount of physical activity.;Long Term: Exercising regularly at least 3-5 days a week.       Increase Strength and Stamina  Yes       Intervention  Provide advice, education, support and counseling about physical activity/exercise needs.;Develop an individualized exercise prescription for aerobic and resistive training based on initial evaluation findings, risk stratification, comorbidities and participant's personal goals.        Expected Outcomes  Short Term: Increase workloads from initial exercise prescription for resistance, speed, and METs.;Short Term: Perform resistance training exercises routinely during rehab and add in resistance training at home;Long Term: Improve cardiorespiratory fitness, muscular endurance and strength as measured by increased METs and functional capacity (6MWT)       Able to understand and use rate of perceived exertion (RPE) scale  Yes       Intervention  Provide education and explanation on how to use RPE scale       Expected Outcomes  Short Term: Able to use RPE daily in rehab to express subjective intensity level;Long Term:  Able to use RPE to guide intensity level when exercising independently       Able to understand and use Dyspnea scale  Yes       Intervention  Provide education and explanation on how to use Dyspnea scale       Expected Outcomes  Short Term: Able to use Dyspnea scale daily in rehab to express subjective sense of shortness of breath during exertion;Long Term: Able to use Dyspnea scale to guide intensity level when exercising independently       Knowledge and understanding of Target Heart Rate Range (THRR)  Yes       Intervention  Provide education and explanation of THRR including how the numbers were predicted and where they are located for reference       Expected Outcomes  Short Term: Able to state/look up THRR;Short Term: Able to use daily as guideline for intensity in rehab;Long Term: Able to use THRR to govern intensity when exercising independently       Able to check pulse independently  Yes       Intervention  Provide education and demonstration on how to check pulse in carotid and radial arteries.;Review the importance of being able  to check your own pulse for safety during independent exercise       Expected Outcomes  Short Term: Able to explain why pulse checking is important during independent exercise;Long Term: Able to check pulse independently and accurately        Understanding of Exercise Prescription  Yes       Intervention  Provide education, explanation, and written materials on patient's individual exercise prescription       Expected Outcomes  Short Term: Able to explain program exercise prescription;Long Term: Able to explain home exercise prescription to exercise independently          Exercise Goals Re-Evaluation : Exercise Goals Re-Evaluation    Row Name 11/19/17 1016 11/23/17 1044 12/05/17 1224 12/19/17 1136 01/16/18 1239     Exercise Goal Re-Evaluation   Exercise Goals Review  Increase Physical Activity;Increase Strength and Stamina;Able to understand and use rate of perceived exertion (RPE) scale;Knowledge and understanding of Target Heart Rate Range (THRR);Understanding of Exercise Prescription;Able to understand and use Dyspnea scale  Increase Physical Activity;Able to understand and use rate of perceived exertion (RPE) scale;Knowledge and understanding of Target Heart Rate Range (THRR);Increase Strength and Stamina;Able to understand and use Dyspnea scale  Increase Physical Activity;Increase Strength and Stamina;Able to understand and use rate of perceived exertion (RPE) scale;Able to understand and use Dyspnea scale  Increase Physical Activity;Increase Strength and Stamina;Able to understand and use rate of perceived exertion (RPE) scale;Able to understand and use Dyspnea scale  Increase Physical Activity;Increase Strength and Stamina;Able to understand and use rate of perceived exertion (RPE) scale;Able to understand and use Dyspnea scale;Knowledge and understanding of Target Heart Rate Range (THRR);Understanding of Exercise Prescription   Comments  Reviewed RPE scale, THR and program prescription with pt today.  Pt voiced understanding and was given a copy of goals to take home.   Reviewed home exercise with pt today.  Pt plans to go to [facility] for exercise.  Reviewed THR, pulse, RPE, sign and symptoms, NTG use, and when to call 911 or MD.   Also discussed weather considerations and indoor options.  Pt voiced understanding.  Duston tolerates exercise well.  He walks consistently 34-37 laps around the track.  Grover is graudally increasing laps walked and overall METs.  He will miss a couple weeks going to [place] for his anniversary.  Anterio has maintained fitness during his vacation in October. Staff will encourage increasing weight strength training and on machines.    Expected Outcomes  Short: Use RPE daily to regulate intensity. Long: Follow program prescription in THR.  Short - exercise 3 days per week between LW and home Long - exercise 3 days on his own   Short - continue to attend consistently, consider 4 lb for strength work Long - increase from current MET level  Short - exercise while on vacation Long - increase MET level  Short - inc to 4 lb for strength work Long - increase MET level   [place] Name 01/30/18 1153 02/13/18 1220 02/26/18 1129         Exercise Goal Re-Evaluation   Exercise Goals Review  Increase Physical Activity;Increase Strength and Stamina;Able to understand and use rate of perceived exertion (RPE) scale;Able to understand and use Dyspnea scale;Knowledge and understanding of Target Heart Rate Range (THRR);Understanding of Exercise Prescription  Increase Physical Activity;Increase Strength and Stamina;Able to understand and use rate of perceived exertion (RPE) scale;Able to understand and use Dyspnea scale;Knowledge and understanding of Target Heart Rate Range (THRR);Understanding of Exercise Prescription  Increase Physical Activity;Increase Strength and Stamina;Able to understand and use rate of perceived exertion (RPE) scale;Able to understand and use Dyspnea scale;Knowledge and understanding of Target Heart Rate Range (THRR);Understanding of Exercise Prescription     Comments  Omere has increased level on T5 NS.  Overall METS slightly increased.  Staff will monitor progress.  Fouad works consistently at [employer].  He  has increased endurance walking.  Staff will encourage increase of weight to 4 lb  Bretton is maintaining MET level.  He works in correct RPE level and does well with 45 min of cardio.     Expected Outcomes  Short - attend consistently Long - increase MET level  SHort - increase to 4 lb strength training Long - increase MET level  Short - complete LW program Long - maintain exercise on his own        Discharge Exercise Prescription (Final Exercise Prescription Changes): Exercise Prescription Changes - 02/26/18 1100      Response to Exercise   Blood Pressure (Admit)  136/70    Blood Pressure (Exit)  138/78    Heart Rate (Admit)  88 bpm    Heart Rate (Exercise)  87 bpm    Heart Rate (Exit)  70 bpm    Oxygen Saturation (Admit)  94 %    Oxygen Saturation (Exercise)  94 %    Oxygen Saturation (Exit)  97 %    Rating of Perceived Exertion (Exercise)  13    Perceived Dyspnea (Exercise)  2    Symptoms  none    Duration  Continue with 45 min of aerobic exercise without signs/symptoms of physical distress.    Intensity  THRR unchanged   correct RPE range     Progression   Progression  Continue to progress workloads to maintain intensity without signs/symptoms of physical distress.    Average METs  2.4      Resistance Training   Training Prescription  Yes    Weight  3 lb    Reps  10-15      Interval Training   Interval Training  No      Recumbant Bike   Level  3    RPM  60    Watts  19    Minutes  15    METs  2.7      T5 Nustep   Level  5    SPM  80    Minutes  15    METs  2      Track   Laps  34    Minutes  15    METs  2.5       Nutrition:  Target Goals: Understanding of nutrition guidelines, daily intake of sodium <1554m, cholesterol <2054m calories 30% from fat and 7% or less from saturated fats, daily to have 5 or more servings of fruits and vegetables.  Biometrics: Pre Biometrics - 11/13/17 1157      Pre Biometrics   Height  5' 10.5" (1.791 m)    Weight  197  lb 11.2 oz (89.7 kg)    Waist Circumference  42.5 inches    Hip Circumference  43 inches    Waist to Hip Ratio  0.99 %    BMI (Calculated)  27.96      Post Biometrics - 03/01/18 1049       Post  Biometrics   Height  5' 10.5" (1.791 m)    Weight  196 lb (88.9 kg)    Waist  Circumference  42 inches    Hip Circumference  45 inches    Waist to Hip Ratio  0.93 %    BMI (Calculated)  27.72       Nutrition Therapy Plan and Nutrition Goals: Nutrition Therapy & Goals - 11/19/17 1043      Nutrition Therapy   RD appointment deferred  Yes   reports his wife to be monitoring his diet      Nutrition Assessments: Nutrition Assessments - 11/13/17 1100      MEDFICTS Scores   Pre Score  33       Nutrition Goals Re-Evaluation:   Nutrition Goals Discharge (Final Nutrition Goals Re-Evaluation):   Psychosocial: Target Goals: Acknowledge presence or absence of significant depression and/or stress, maximize coping skills, provide positive support system. Participant is able to verbalize types and ability to use techniques and skills needed for reducing stress and depression.   Initial Review & Psychosocial Screening: Initial Psych Review & Screening - 11/13/17 1117      Initial Review   Current issues with  None Identified      Family Dynamics   Good Support System?  Yes    Comments  Wife and Kids are good support      Screening Interventions   Interventions  Encouraged to exercise;Program counselor consult;Provide feedback about the scores to participant;To provide support and resources with identified psychosocial needs    Expected Outcomes  Short Term goal: Utilizing psychosocial counselor, staff and physician to assist with identification of specific Stressors or current issues interfering with healing process. Setting desired goal for each stressor or current issue identified.;Long Term Goal: Stressors or current issues are controlled or eliminated.;Short Term goal:  Identification and review with participant of any Quality of Life or Depression concerns found by scoring the questionnaire.;Long Term goal: The participant improves quality of Life and PHQ9 Scores as seen by post scores and/or verbalization of changes       Quality of Life Scores:  Scores of 19 and below usually indicate a poorer quality of life in these areas.  A difference of  2-3 points is a clinically meaningful difference.  A difference of 2-3 points in the total score of the Quality of Life Index has been associated with significant improvement in overall quality of life, self-image, physical symptoms, and general health in studies assessing change in quality of life.  PHQ-9: Recent Review Flowsheet Data    Depression screen [facility] 2/9 02/18/2018 11/13/2017 04/10/2016 12/28/2015 11/23/2015   Decreased Interest 0 0 0 0 0   Down, Depressed, Hopeless 0 0 0 0 0   PHQ - 2 Score 0 0 0 0 0   Altered sleeping 0 0 0 - 0   Tired, decreased energy 1 0 0 - 0   Change in appetite 0 0 0 - 0   Feeling bad or failure about yourself  0 0 0 - 0   Trouble concentrating 0 0 0 - 0   Moving slowly or fidgety/restless 0 0 0 - 0   Suicidal thoughts 0 0 0 - 0   PHQ-9 Score 1 0 0 - 0   Difficult doing work/chores Not difficult at all Not difficult at all - - -     Interpretation of Total Score  Total Score Depression Severity:  1-4 = Minimal depression, 5-9 = Mild depression, 10-14 = Moderate depression, 15-19 = Moderately severe depression, 20-27 = Severe depression   Psychosocial Evaluation and Intervention: Psychosocial Evaluation - 11/19/17 1107  Psychosocial Evaluation & Interventions   Comments  Mr. Blanchette Couzens)  has returned to this program after almost a year and counselor met with him for his initial psychosocial evaluation today. He is an 82 year old who has a strong support system with a spouse of 72 years and (2) adult children who live in other states.  Dshaun is also actively involved in  his faith community.  He has multiple health issues with diabetes; high blood pressure and gout in addition to his pulmonary condition.  He reports sleeping "great" and having a good appetite.  He is typically in a positive mood and denies any history or current symptoms of depression or anxiety.  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  Metro's goals for this program are to be more consistent in exercise and increase his stamina and strength.  He plans to go to [place] with his spouse in October and would like to be more fit to enjoy this trip.  Staff will follow with Evette Doffing throughout the course of this program.       Expected Outcomes  Short:  Rishith will get back into a routine of exercise to become more consistent and to increase his stamina and strength.   Long:  Tyshon will make exercise a healthy lifestyle for himself.    Continue Psychosocial Services   Follow up required by staff       Psychosocial Re-Evaluation: Psychosocial Re-Evaluation    [place] Name 12/17/17 1639 01/23/18 1047 02/13/18 1428         Psychosocial Re-Evaluation   Current issues with  Current Stress Concerns  -  None Identified     Comments  Patient states that the only stress he is is when he gets short of breath. He has issues with going up hills and inclines. His wife of 6 years is doing well and they have a great marraige.  Counselor follow up with Shail reporting his trip to [place] was amazing and so enjoyable with his spouse of 24 years.  He felt his stamina and strength were improved prior to going in order for him to be able to enjoy this trip as much.  He currently is aware that his condition is not one that will improve significantly - so exercise and medication will slow the progression - and that is his goal. Brighten reports not enjoying exercise but he does it for the purpose of maintaining his current stamina.  He continues to sleep well; be in a positive mood  and has minimal stress in his life other than his health.  He reports his Dr. just increased his medications for his pulmonary condition yesterday and he is concerned about the side effects which typically are diarrhea.  His Dr. will monitor this.   Alwyn is happy that his daughter just recently moved within 1.5 hour drive vs 12 hours away - so he sees her more often and that is positive for he and his spouse.  He will continue his exercise regimen [facility]  Staff will continue to follow.  Deaken states that he a a genuinely happy man and he has no problems to report. His wife is taking care of him and things are good. He is enjoying the program and wants to keep it up.     Expected Outcomes  Short: Attend [facility] stress management education to decrease stress. Long: Maintain exercise  Post [facility] to keep stress at a minimum.  Short:  Aristotle will continue to exercise to maintain his stamina and strength and slow the progression of his disease.   Long:  Felipe will continue to exercise for his health and to manage stress in his life.    Short: Attend [facility] stress management education to decrease stress. Long: Maintain exercise Post [facility] to keep stress at a minimum.     Interventions  Encouraged to attend Pulmonary Rehabilitation for the exercise  -  Encouraged to attend Pulmonary Rehabilitation for the exercise     Continue Psychosocial Services   Follow up required by staff  Follow up required by staff  Follow up required by staff        Psychosocial Discharge (Final Psychosocial Re-Evaluation): Psychosocial Re-Evaluation - 02/13/18 1428      Psychosocial Re-Evaluation   Current issues with  None Identified    Comments  Ruston states that he a a genuinely happy man and he has no problems to report. His wife is taking care of him and things are good. He is enjoying the program and wants to keep it up.    Expected Outcomes  Short: Attend  [facility] stress management education to decrease stress. Long: Maintain exercise Post [facility] to keep stress at a minimum.    Interventions  Encouraged to attend Pulmonary Rehabilitation for the exercise    Continue Psychosocial Services   Follow up required by staff       Education: Education Goals: Education classes will be provided on a weekly basis, covering required topics. Participant will state understanding/return demonstration of topics presented.  Learning Barriers/Preferences: Learning Barriers/Preferences - 11/13/17 1104      Learning Barriers/Preferences   Learning Barriers  None    Learning Preferences  Group Instruction;Individual Instruction;Pictoral;Skilled Demonstration;Verbal Instruction;Video;Written Material       Education Topics:  Initial Evaluation Education: - Verbal, written and demonstration of respiratory meds, oximetry and breathing techniques. Instruction on use of nebulizers and MDIs and importance of monitoring MDI activations.   Pulmonary Rehab from 02/27/2018 in [facility] Cardiac and Pulmonary Rehab  Date  11/13/17  Educator  [facility]  Instruction Review Code  1- Verbalizes Understanding      General Nutrition Guidelines/Fats and Fiber: -Group instruction provided by verbal, written material, models and posters to present the general guidelines for heart healthy nutrition. Gives an explanation and review of dietary fats and fiber.   Pulmonary Rehab from 02/27/2018 in [facility] Cardiac and Pulmonary Rehab  Date  01/16/18  Educator  [facility]  Instruction Review Code  5- Refused Teaching      Controlling Sodium/Reading Food Labels: -Group verbal and written material supporting the discussion of sodium use in heart healthy nutrition. Review and explanation with models, verbal and written materials for utilization of the food label.   Pulmonary Rehab from 02/27/2018 in [facility] Cardiac and Pulmonary Rehab  Date  01/30/18  Educator  [facility]  Instruction Review Code  1-  Verbalizes Understanding      Exercise Physiology & General Exercise Guidelines: - Group verbal and written instruction with models to review the exercise physiology of the cardiovascular system and associated critical values. Provides general exercise guidelines with specific guidelines to those with heart or lung disease.    Pulmonary Rehab from 02/27/2018 in [facility] Cardiac and Pulmonary Rehab  Date  02/15/18  Educator  [facility]  Instruction Review Code  1- Verbalizes Understanding      Aerobic Exercise & Resistance Training: - Gives group  verbal and written instruction on the various components of exercise. Focuses on aerobic and resistive training programs and the benefits of this training and how to safely progress through these programs.   Pulmonary Rehab from 02/27/2018 in [facility] Cardiac and Pulmonary Rehab  Date  12/19/17  Educator  [facility]  Instruction Review Code  1- Verbalizes Understanding      Flexibility, Balance, Mind/Body Relaxation: Provides group verbal/written instruction on the benefits of flexibility and balance training, including mind/body exercise modes such as yoga, pilates and tai chi.  Demonstration and skill practice provided.   Stress and Anxiety: - Provides group verbal and written instruction about the health risks of elevated stress and causes of high stress.  Discuss the correlation between heart/lung disease and anxiety and treatment options. Review healthy ways to manage with stress and anxiety.   Pulmonary Rehab from 02/27/2018 in [facility] Cardiac and Pulmonary Rehab  Date  01/23/18  Educator  [facility]  Instruction Review Code  1- Verbalizes Understanding      Depression: - Provides group verbal and written instruction on the correlation between heart/lung disease and depressed mood, treatment options, and the stigmas associated with seeking treatment.   Exercise & Equipment Safety: - Individual verbal instruction and demonstration of equipment use and safety with  use of the equipment.   Pulmonary Rehab from 02/27/2018 in [facility] Cardiac and Pulmonary Rehab  Date  11/13/17  Educator  [facility]  Instruction Review Code  1- Verbalizes Understanding      Infection Prevention: - Provides verbal and written material to individual with discussion of infection control including proper hand washing and proper equipment cleaning during exercise session.   Pulmonary Rehab from 02/27/2018 in [facility] Cardiac and Pulmonary Rehab  Date  11/13/17  Educator  [facility]  Instruction Review Code  1- Verbalizes Understanding      Falls Prevention: - Provides verbal and written material to individual with discussion of falls prevention and safety.   Pulmonary Rehab from 02/27/2018 in [facility] Cardiac and Pulmonary Rehab  Date  11/13/17  Educator  [facility]  Instruction Review Code  1- Verbalizes Understanding      Diabetes: - Individual verbal and written instruction to review signs/symptoms of diabetes, desired ranges of glucose level fasting, after meals and with exercise. Advice that pre and post exercise glucose checks will be done for 3 sessions at entry of program.   Pulmonary Rehab from 04/19/2016 in [facility] Cardiac and Pulmonary Rehab  Date  11/29/15  Educator  [facility]  Instruction Review Code (retired)  2- meets goals/outcomes      Chronic Lung Diseases: - Group verbal and written instruction to review updates, respiratory medications, advancements in procedures and treatments. Discuss use of supplemental oxygen including available portable oxygen systems, continuous and intermittent flow rates, concentrators, personal use and safety guidelines. Review proper use of inhaler and spacers. Provide informative websites for self-education.    Pulmonary Rehab from 02/27/2018 in [facility] Cardiac and Pulmonary Rehab  Date  02/13/18  Educator  [facility]  Instruction Review Code  1- Verbalizes Understanding      Energy Conservation: - Provide group verbal and written instruction for methods to conserve  energy, plan and organize activities. Instruct on pacing techniques, use of adaptive equipment and posture/positioning to relieve shortness of breath.   Pulmonary Rehab from 02/27/2018 in [facility] Cardiac and Pulmonary Rehab  Date  01/09/18  Educator  [facility]  Instruction Review Code  1- Verbalizes Understanding      Triggers and Exacerbations: - Group verbal  and written instruction to review types of environmental triggers and ways to prevent exacerbations. Discuss weather changes, air quality and the benefits of nasal washing. Review warning signs and symptoms to help prevent infections. Discuss techniques for effective airway clearance, coughing, and vibrations.   Pulmonary Rehab from 02/27/2018 in [facility] Cardiac and Pulmonary Rehab  Date  12/12/17  Educator  [facility]  Instruction Review Code  1- Verbalizes Understanding      AED/CPR: - Group verbal and written instruction with the use of models to demonstrate the basic use of the AED with the basic ABC's of resuscitation.   Pulmonary Rehab from 02/27/2018 in [facility] Cardiac and Pulmonary Rehab  Date  01/18/18  Educator  [facility]  Instruction Review Code  1- Actuary and Physiology of the Lungs: - Group verbal and written instruction with the use of models to provide basic lung anatomy and physiology related to function, structure and complications of lung disease.   Pulmonary Rehab from 02/27/2018 in [facility] Cardiac and Pulmonary Rehab  Date  01/25/18  Educator  [facility]  Instruction Review Code  1- Verbalizes Understanding      Anatomy & Physiology of the Heart: - Group verbal and written instruction and models provide basic cardiac anatomy and physiology, with the coronary electrical and arterial systems. Review of Valvular disease and Heart Failure   Cardiac Medications: - Group verbal and written instruction to review commonly prescribed medications for heart disease. Reviews the medication, class of the drug, and side  effects.   Pulmonary Rehab from 04/19/2016 in [facility] Cardiac and Pulmonary Rehab  Date  12/24/15  Educator  [facility]  Instruction Review Code (retired)  2- meets goals/outcomes      Know Your Numbers and Risk Factors: -Group verbal and written instruction about important numbers in your health.  Discussion of what are risk factors and how they play a role in the disease process.  Review of Cholesterol, Blood Pressure, Diabetes, and BMI and the role they play in your overall health.   Pulmonary Rehab from 02/27/2018 in [facility] Cardiac and Pulmonary Rehab  Date  02/27/18  Educator  [facility]  Instruction Review Code  1- Verbalizes Understanding      Sleep Hygiene: -Provides group verbal and written instruction about how sleep can affect your health.  Define sleep hygiene, discuss sleep cycles and impact of sleep habits. Review good sleep hygiene tips.    Other: -Provides group and verbal instruction on various topics (see comments)    Knowledge Questionnaire Score: Knowledge Questionnaire Score - 02/18/18 1044      Knowledge Questionnaire Score   Pre Score  15/18    Post Score  17/18   reviewed with patient       Core Components/Risk Factors/Patient Goals at Admission: Personal Goals and Risk Factors at Admission - 11/13/17 1119      Core Components/Risk Factors/Patient Goals on Admission    Weight Management  Yes;Weight Maintenance    Intervention  Weight Management/Obesity: Establish reasonable short term and long term weight goals.;Weight Management: Provide education and appropriate resources to help participant work on and attain dietary goals.;Weight Management: Develop a combined nutrition and exercise program designed to reach desired caloric intake, while maintaining appropriate intake of nutrient and fiber, sodium and fats, and appropriate energy expenditure required for the weight goal.    Admit Weight  197 lb 11.2 oz (89.7 kg)    Goal Weight: Short Term  195 lb (88.5 kg)  Goal  Weight: Long Term  195 lb (88.5 kg)    Expected Outcomes  Short Term: Continue to assess and modify interventions until short term weight is achieved;Long Term: Adherence to nutrition and physical activity/exercise program aimed toward attainment of established weight goal;Weight Maintenance: Understanding of the daily nutrition guidelines, which includes 25-35% calories from fat, 7% or less cal from saturated fats, less than 281m cholesterol, less than 1.5gm of sodium, & 5 or more servings of fruits and vegetables daily;Understanding recommendations for meals to include 15-35% energy as protein, 25-35% energy from fat, 35-60% energy from carbohydrates, less than 2085mof dietary cholesterol, 20-35 gm of total fiber daily;Understanding of distribution of calorie intake throughout the day with the consumption of 4-5 meals/snacks    Improve shortness of breath with ADL's  Yes    Intervention  Provide education, individualized exercise plan and daily activity instruction to help decrease symptoms of SOB with activities of daily living.    Expected Outcomes  Short Term: Improve cardiorespiratory fitness to achieve a reduction of symptoms when performing ADLs;Long Term: Be able to perform more ADLs without symptoms or delay the onset of symptoms    Diabetes  Yes    Intervention  Provide education about signs/symptoms and action to take for hypo/hyperglycemia.;Provide education about proper nutrition, including hydration, and aerobic/resistive exercise prescription along with prescribed medications to achieve blood glucose in normal ranges: Fasting glucose 65-99 mg/dL    Expected Outcomes  Short Term: Participant verbalizes understanding of the signs/symptoms and immediate care of hyper/hypoglycemia, proper foot care and importance of medication, aerobic/resistive exercise and nutrition plan for blood glucose control.;Long Term: Attainment of HbA1C < 7%.    Hypertension  Yes    Intervention  Provide education on  lifestyle modifcations including regular physical activity/exercise, weight management, moderate sodium restriction and increased consumption of fresh fruit, vegetables, and low fat dairy, alcohol moderation, and smoking cessation.;Monitor prescription use compliance.    Expected Outcomes  Short Term: Continued assessment and intervention until BP is < 140/9034mG in hypertensive participants. < 130/50m18m in hypertensive participants with diabetes, heart failure or chronic kidney disease.;Long Term: Maintenance of blood pressure at goal levels.    Lipids  Yes    Intervention  Provide education and support for participant on nutrition & aerobic/resistive exercise along with prescribed medications to achieve LDL <70mg23mL >40mg.94mExpected Outcomes  Short Term: Participant states understanding of desired cholesterol values and is compliant with medications prescribed. Participant is following exercise prescription and nutrition guidelines.;Long Term: Cholesterol controlled with medications as prescribed, with individualized exercise RX and with personalized nutrition plan. Value goals: LDL < 70mg, 95m> 40 mg.       Core Components/Risk Factors/Patient Goals Review:  Goals and Risk Factor Review    Row Name 12/17/17 1647 01/25/18 1030 02/13/18 1435         Core Components/Risk Factors/Patient Goals Review   Personal Goals Review  Weight Management/Obesity;Improve shortness of breath with ADL's;Lipids;Hypertension;Diabetes  Weight Management/Obesity;Improve shortness of breath with ADL's;Lipids;Hypertension;Diabetes  Weight Management/Obesity;Improve shortness of breath with ADL's;Hypertension;Diabetes     Review  Patient is doing well in [facility] willing to work hard. His blood sugar has been within normal limits. He wants to "keep moving" as he gets older and wants to be able to get around the house and do things with his wife. His blood pressure has been under control with it being  130s/70s.  [facility] regularly. He has  been walking more laps walking and has been improving with his shortness of breath. He is still wants to keep moving and will exercise when he is done with the program.  Fernando has been doing well in [place]. His blood pressure has been doing better since the start of the program. He manages his shortness of breath well. He checks his blood glucose at home and has been in good range for him. He knows he needs to keep exercising to keep up with his ADL's. Atlee has not lost weight since the start of the program but has increased in stamina. He states he could lose a few more pounds before the end of the program.     Expected Outcomes  Short: Attend [facility] regularly to improve shortness of breath with ADL's. Long: maintain independence with ADL's   Short: graduate [facility]. Long: maintain exercise to make ADL's easier.  Short: Lose 5 pounds. Long: maintain and continue weight loss.        Core Components/Risk Factors/Patient Goals at Discharge (Final Review):  Goals and Risk Factor Review - 02/13/18 1435      Core Components/Risk Factors/Patient Goals Review   Personal Goals Review  Weight Management/Obesity;Improve shortness of breath with ADL's;Hypertension;Diabetes    Review  Guinn has been doing well in [place]. His blood pressure has been doing better since the start of the program. He manages his shortness of breath well. He checks his blood glucose at home and has been in good range for him. He knows he needs to keep exercising to keep up with his ADL's. Brannen has not lost weight since the start of the program but has increased in stamina. He states he could lose a few more pounds before the end of the program.    Expected Outcomes  Short: Lose 5 pounds. Long: maintain and continue weight loss.       ITP Comments: ITP Comments    Row Name 11/13/17 1042 12/03/17 0820 12/31/17 0824 01/02/18 1250 02/25/18 0819   ITP  Comments  Medical Evaluation completed. Chart sent for review and changes to Dr. Emily Filbert Director of [place]. Diagnosis can be found in [facility] encounter 09/26/17  30 day review completed. ITP sent to Dr. Emily Filbert Director of [facility]. Continue with ITP unless changes are made by physician  30 day review completed. ITP sent to Dr. Emily Filbert Director of [place]. Continue with ITP unless changes are made by physician.  Terez has been on vacation since last review.    30 day review completed. ITP sent to Dr. Emily Filbert Director of [facility]. Continue with ITP unless changes are made by physician.      Comments: Discharge ITP

## 2018-03-01 NOTE — Patient Instructions (Signed)
Discharge Patient Instructions  Patient Details  Name: Jeremiah Gibson MRN: [id number] Date of Birth: 27-May-1935 Referring Provider:  Rigoberto Noel, MD   Number of Visits: 4  Reason for Discharge:  Patient reached a stable level of exercise. Patient independent in their exercise. Patient has met program and personal goals.  Smoking History:  Social History   Tobacco Use  Smoking Status Former Smoker  . Packs/day: 2.00  . Years: 20.00  . Pack years: 40.00  . Types: Cigarettes, Pipe  . Last attempt to quit: 11/22/1999  . Years since quitting: 18.2  Smokeless Tobacco Never Used  Tobacco Comment   quit cigs in 70 and pipe in 01    Diagnosis:  Interstitial lung disease ([place])  Pulmonary fibrosis ([place])  Initial Exercise Prescription: Initial Exercise Prescription - 11/13/17 1200      Date of Initial Exercise RX and Referring Provider   Date  11/13/17    Referring Provider  Alba      Recumbant Bike   Level  2    RPM  60    Watts  10    Minutes  15    METs  2.4      T5 Nustep   Level  2    SPM  80    Minutes  15    METs  2.4      Track   Laps  30    Minutes  15    METs  2.4      Prescription Details   Frequency (times per week)  3    Duration  Progress to 45 minutes of aerobic exercise without signs/symptoms of physical distress      Intensity   THRR 40-80% of Max Heartrate  105-128    Ratings of Perceived Exertion  11-15    Perceived Dyspnea  0-4      Resistance Training   Training Prescription  Yes    Weight  3 lb    Reps  10-15       Discharge Exercise Prescription (Final Exercise Prescription Changes): Exercise Prescription Changes - 02/26/18 1100      Response to Exercise   Blood Pressure (Admit)  136/70    Blood Pressure (Exit)  138/78    Heart Rate (Admit)  88 bpm    Heart Rate (Exercise)  87 bpm    Heart Rate (Exit)  70 bpm    Oxygen Saturation (Admit)  94 %    Oxygen Saturation (Exercise)  94 %    Oxygen Saturation (Exit)  97 %     Rating of Perceived Exertion (Exercise)  13    Perceived Dyspnea (Exercise)  2    Symptoms  none    Duration  Continue with 45 min of aerobic exercise without signs/symptoms of physical distress.    Intensity  THRR unchanged   correct RPE range     Progression   Progression  Continue to progress workloads to maintain intensity without signs/symptoms of physical distress.    Average METs  2.4      Resistance Training   Training Prescription  Yes    Weight  3 lb    Reps  10-15      Interval Training   Interval Training  No      Recumbant Bike   Level  3    RPM  60    Watts  19    Minutes  15    METs  2.7  T5 Nustep   Level  5    SPM  80    Minutes  15    METs  2      Track   Laps  34    Minutes  15    METs  2.5       Functional Capacity: 6 Minute Walk    Row Name 11/13/17 1201 03/01/18 1049       6 Minute Walk   Phase  Initial  Discharge    Distance  1300 feet  1365 feet    Distance % Change  -  5 %    Distance Feet Change  -  65 ft    Walk Time  6 minutes  6 minutes    # of Rest Breaks  0  0    MPH  2.46  2.6    METS  2.8  2.6    RPE  11  12    Perceived Dyspnea   1  2    VO2 Peak  9.8  9.15    Symptoms  No  No    Resting HR  82 bpm  81 bpm    Resting BP  124/64  128/64    Resting Oxygen Saturation   96 %  98 %    Exercise Oxygen Saturation  during 6 min walk  94 %  94 %    Max Ex. HR  108 bpm  110 bpm    Max Ex. BP  174/84  132/74    2 Minute Post BP  144/64  -      Interval HR   1 Minute HR  87  67    2 Minute HR  96  -    3 Minute HR  96  107    4 Minute HR  104  110    5 Minute HR  108  107    6 Minute HR  103  105    2 Minute Post HR  90  -    Interval Heart Rate?  Yes  -      Interval Oxygen   Interval Oxygen?  Yes  -    Baseline Oxygen Saturation %  96 %  98 %    1 Minute Oxygen Saturation %  94 %  96 %    1 Minute Liters of Oxygen  0 L  0 L    2 Minute Oxygen Saturation %  94 %  95 %    2 Minute Liters of Oxygen  0 L  0 L     3 Minute Oxygen Saturation %  94 %  94 %    3 Minute Liters of Oxygen  0 L  0 L    4 Minute Oxygen Saturation %  95 %  94 %    4 Minute Liters of Oxygen  0 L  0 L    5 Minute Oxygen Saturation %  94 %  94 %    5 Minute Liters of Oxygen  0 L  0 L    6 Minute Oxygen Saturation %  94 %  96 %    6 Minute Liters of Oxygen  0 L  0 L    2 Minute Post Oxygen Saturation %  97 %  -    2 Minute Post Liters of Oxygen  0 L  0 L       Quality of Life:   Personal Goals: Goals established  at orientation with interventions provided to work toward goal. Personal Goals and Risk Factors at Admission - 11/13/17 1119      Core Components/Risk Factors/Patient Goals on Admission    Weight Management  Yes;Weight Maintenance    Intervention  Weight Management/Obesity: Establish reasonable short term and long term weight goals.;Weight Management: Provide education and appropriate resources to help participant work on and attain dietary goals.;Weight Management: Develop a combined nutrition and exercise program designed to reach desired caloric intake, while maintaining appropriate intake of nutrient and fiber, sodium and fats, and appropriate energy expenditure required for the weight goal.    Admit Weight  197 lb 11.2 oz (89.7 kg)    Goal Weight: Short Term  195 lb (88.5 kg)    Goal Weight: Long Term  195 lb (88.5 kg)    Expected Outcomes  Short Term: Continue to assess and modify interventions until short term weight is achieved;Long Term: Adherence to nutrition and physical activity/exercise program aimed toward attainment of established weight goal;Weight Maintenance: Understanding of the daily nutrition guidelines, which includes 25-35% calories from fat, 7% or less cal from saturated fats, less than 235m cholesterol, less than 1.5gm of sodium, & 5 or more servings of fruits and vegetables daily;Understanding recommendations for meals to include 15-35% energy as protein, 25-35% energy from fat, 35-60% energy  from carbohydrates, less than 2067mof dietary cholesterol, 20-35 gm of total fiber daily;Understanding of distribution of calorie intake throughout the day with the consumption of 4-5 meals/snacks    Improve shortness of breath with ADL's  Yes    Intervention  Provide education, individualized exercise plan and daily activity instruction to help decrease symptoms of SOB with activities of daily living.    Expected Outcomes  Short Term: Improve cardiorespiratory fitness to achieve a reduction of symptoms when performing ADLs;Long Term: Be able to perform more ADLs without symptoms or delay the onset of symptoms    Diabetes  Yes    Intervention  Provide education about signs/symptoms and action to take for hypo/hyperglycemia.;Provide education about proper nutrition, including hydration, and aerobic/resistive exercise prescription along with prescribed medications to achieve blood glucose in normal ranges: Fasting glucose 65-99 mg/dL    Expected Outcomes  Short Term: Participant verbalizes understanding of the signs/symptoms and immediate care of hyper/hypoglycemia, proper foot care and importance of medication, aerobic/resistive exercise and nutrition plan for blood glucose control.;Long Term: Attainment of HbA1C < 7%.    Hypertension  Yes    Intervention  Provide education on lifestyle modifcations including regular physical activity/exercise, weight management, moderate sodium restriction and increased consumption of fresh fruit, vegetables, and low fat dairy, alcohol moderation, and smoking cessation.;Monitor prescription use compliance.    Expected Outcomes  Short Term: Continued assessment and intervention until BP is < 140/9026mG in hypertensive participants. < 130/31m64m in hypertensive participants with diabetes, heart failure or chronic kidney disease.;Long Term: Maintenance of blood pressure at goal levels.    Lipids  Yes    Intervention  Provide education and support for participant on  nutrition & aerobic/resistive exercise along with prescribed medications to achieve LDL <70mg40mL >40mg.71mExpected Outcomes  Short Term: Participant states understanding of desired cholesterol values and is compliant with medications prescribed. Participant is following exercise prescription and nutrition guidelines.;Long Term: Cholesterol controlled with medications as prescribed, with individualized exercise RX and with personalized nutrition plan. Value goals: LDL < 70mg, 89m> 40 mg.        Personal Goals Discharge: Goals  and Risk Factor Review - 02/13/18 1435      Core Components/Risk Factors/Patient Goals Review   Personal Goals Review  Weight Management/Obesity;Improve shortness of breath with ADL's;Hypertension;Diabetes    Review  Keshan has been doing well in [place]. His blood pressure has been doing better since the start of the program. He manages his shortness of breath well. He checks his blood glucose at home and has been in good range for him. He knows he needs to keep exercising to keep up with his ADL's. Oval has not lost weight since the start of the program but has increased in stamina. He states he could lose a few more pounds before the end of the program.    Expected Outcomes  Short: Lose 5 pounds. Long: maintain and continue weight loss.       Exercise Goals and Review: Exercise Goals    Row Name 11/13/17 1200             Exercise Goals   Increase Physical Activity  Yes       Intervention  Provide advice, education, support and counseling about physical activity/exercise needs.;Develop an individualized exercise prescription for aerobic and resistive training based on initial evaluation findings, risk stratification, comorbidities and participant's personal goals.       Expected Outcomes  Short Term: Attend rehab on a regular basis to increase amount of physical activity.;Long Term: Add in home exercise to make exercise part of routine and to increase amount  of physical activity.;Long Term: Exercising regularly at least 3-5 days a week.       Increase Strength and Stamina  Yes       Intervention  Provide advice, education, support and counseling about physical activity/exercise needs.;Develop an individualized exercise prescription for aerobic and resistive training based on initial evaluation findings, risk stratification, comorbidities and participant's personal goals.       Expected Outcomes  Short Term: Increase workloads from initial exercise prescription for resistance, speed, and METs.;Short Term: Perform resistance training exercises routinely during rehab and add in resistance training at home;Long Term: Improve cardiorespiratory fitness, muscular endurance and strength as measured by increased METs and functional capacity (6MWT)       Able to understand and use rate of perceived exertion (RPE) scale  Yes       Intervention  Provide education and explanation on how to use RPE scale       Expected Outcomes  Short Term: Able to use RPE daily in rehab to express subjective intensity level;Long Term:  Able to use RPE to guide intensity level when exercising independently       Able to understand and use Dyspnea scale  Yes       Intervention  Provide education and explanation on how to use Dyspnea scale       Expected Outcomes  Short Term: Able to use Dyspnea scale daily in rehab to express subjective sense of shortness of breath during exertion;Long Term: Able to use Dyspnea scale to guide intensity level when exercising independently       Knowledge and understanding of Target Heart Rate Range (THRR)  Yes       Intervention  Provide education and explanation of THRR including how the numbers were predicted and where they are located for reference       Expected Outcomes  Short Term: Able to state/look up THRR;Short Term: Able to use daily as guideline for intensity in rehab;Long Term: Able to use THRR to govern intensity when  exercising independently        Able to check pulse independently  Yes       Intervention  Provide education and demonstration on how to check pulse in carotid and radial arteries.;Review the importance of being able to check your own pulse for safety during independent exercise       Expected Outcomes  Short Term: Able to explain why pulse checking is important during independent exercise;Long Term: Able to check pulse independently and accurately       Understanding of Exercise Prescription  Yes       Intervention  Provide education, explanation, and written materials on patient's individual exercise prescription       Expected Outcomes  Short Term: Able to explain program exercise prescription;Long Term: Able to explain home exercise prescription to exercise independently          Exercise Goals Re-Evaluation: Exercise Goals Re-Evaluation    Row Name 11/19/17 1016 11/23/17 1044 12/05/17 1224 12/19/17 1136 01/16/18 1239     Exercise Goal Re-Evaluation   Exercise Goals Review  Increase Physical Activity;Increase Strength and Stamina;Able to understand and use rate of perceived exertion (RPE) scale;Knowledge and understanding of Target Heart Rate Range (THRR);Understanding of Exercise Prescription;Able to understand and use Dyspnea scale  Increase Physical Activity;Able to understand and use rate of perceived exertion (RPE) scale;Knowledge and understanding of Target Heart Rate Range (THRR);Increase Strength and Stamina;Able to understand and use Dyspnea scale  Increase Physical Activity;Increase Strength and Stamina;Able to understand and use rate of perceived exertion (RPE) scale;Able to understand and use Dyspnea scale  Increase Physical Activity;Increase Strength and Stamina;Able to understand and use rate of perceived exertion (RPE) scale;Able to understand and use Dyspnea scale  Increase Physical Activity;Increase Strength and Stamina;Able to understand and use rate of perceived exertion (RPE) scale;Able to understand and  use Dyspnea scale;Knowledge and understanding of Target Heart Rate Range (THRR);Understanding of Exercise Prescription   Comments  Reviewed RPE scale, THR and program prescription with pt today.  Pt voiced understanding and was given a copy of goals to take home.   Reviewed home exercise with pt today.  Pt plans to go to [facility] for exercise.  Reviewed THR, pulse, RPE, sign and symptoms, NTG use, and when to call 911 or MD.  Also discussed weather considerations and indoor options.  Pt voiced understanding.  Alexes tolerates exercise well.  He walks consistently 34-37 laps around the track.  Foye is graudally increasing laps walked and overall METs.  He will miss a couple weeks going to [place] for his anniversary.  Dezi has maintained fitness during his vacation in October. Staff will encourage increasing weight strength training and on machines.    Expected Outcomes  Short: Use RPE daily to regulate intensity. Long: Follow program prescription in THR.  Short - exercise 3 days per week between LW and home Long - exercise 3 days on his own   Short - continue to attend consistently, consider 4 lb for strength work Long - increase from current MET level  Short - exercise while on vacation Long - increase MET level  Short - inc to 4 lb for strength work Long - increase MET level   [place] Name 01/30/18 1153 02/13/18 1220 02/26/18 1129         Exercise Goal Re-Evaluation   Exercise Goals Review  Increase Physical Activity;Increase Strength and Stamina;Able to understand and use rate of perceived exertion (RPE) scale;Able to understand and use Dyspnea scale;Knowledge and understanding of Target  Heart Rate Range (THRR);Understanding of Exercise Prescription  Increase Physical Activity;Increase Strength and Stamina;Able to understand and use rate of perceived exertion (RPE) scale;Able to understand and use Dyspnea scale;Knowledge and understanding of Target Heart Rate Range (THRR);Understanding of Exercise Prescription   Increase Physical Activity;Increase Strength and Stamina;Able to understand and use rate of perceived exertion (RPE) scale;Able to understand and use Dyspnea scale;Knowledge and understanding of Target Heart Rate Range (THRR);Understanding of Exercise Prescription     Comments  Khiry has increased level on T5 NS.  Overall METS slightly increased.  Staff will monitor progress.  Viraj works consistently at [employer].  He has increased endurance walking.  Staff will encourage increase of weight to 4 lb  Lenvil is maintaining MET level.  He works in correct RPE level and does well with 45 min of cardio.     Expected Outcomes  Short - attend consistently Long - increase MET level  SHort - increase to 4 lb strength training Long - increase MET level  Short - complete LW program Long - maintain exercise on his own        Nutrition & Weight - Outcomes: Pre Biometrics - 11/13/17 1157      Pre Biometrics   Height  5' 10.5" (1.791 m)    Weight  197 lb 11.2 oz (89.7 kg)    Waist Circumference  42.5 inches    Hip Circumference  43 inches    Waist to Hip Ratio  0.99 %    BMI (Calculated)  27.96      Post Biometrics - 03/01/18 1049       Post  Biometrics   Height  5' 10.5" (1.791 m)    Weight  196 lb (88.9 kg)    Waist Circumference  42 inches    Hip Circumference  45 inches    Waist to Hip Ratio  0.93 %    BMI (Calculated)  27.72       Nutrition: Nutrition Therapy & Goals - 11/19/17 1043      Nutrition Therapy   RD appointment deferred  Yes   reports his wife to be monitoring his diet      Nutrition Discharge: Nutrition Assessments - 11/13/17 1100      MEDFICTS Scores   Pre Score  33       Education Questionnaire Score: Knowledge Questionnaire Score - 02/18/18 1044      Knowledge Questionnaire Score   Pre Score  15/18    Post Score  17/18   reviewed with patient      Goals reviewed with patient; copy given to patient.

## 2018-03-01 NOTE — Progress Notes (Signed)
Discharge Progress Report  Patient Details  Name: Jeremiah Gibson MRN: [id number] Date of Birth: [birth year] Referring Provider:     Pulmonary Rehab from 11/13/2017 in [facility]  Referring Provider  [place]       Number of Visits: 64  Reason for Discharge:  Patient reached a stable level of exercise. Patient independent in their exercise. Patient has met program and personal goals.  Smoking History:  Social History   Tobacco Use  Smoking Status Former Smoker  . Packs/day: 2.00  . Years: 20.00  . Pack years: 40.00  . Types: Cigarettes, Pipe  . Last attempt to quit: 11/22/1999  . Years since quitting: 18.2  Smokeless Tobacco Never Used  Tobacco Comment   quit cigs in 72 and pipe in 01    Diagnosis:  Interstitial lung disease ([place])  Pulmonary fibrosis ([place])  ADL UCSD: Pulmonary Assessment Scores    Row Name 11/13/17 1058 02/18/18 1044       ADL UCSD   ADL Phase  Entry  Exit    SOB Score total  10  18    Rest  0  0    Walk  0  1    Stairs  0  3    Bath  0  0    Dress  0  1    Shop  0  0      CAT Score   CAT Score  9  13      mMRC Score   mMRC Score  1  -       Initial Exercise Prescription: Initial Exercise Prescription - 11/13/17 1200      Date of Initial Exercise RX and Referring Provider   Date  11/13/17    Referring Provider  Alba      Recumbant Bike   Level  2    RPM  60    Watts  10    Minutes  15    METs  2.4      T5 Nustep   Level  2    SPM  80    Minutes  15    METs  2.4      Track   Laps  30    Minutes  15    METs  2.4      Prescription Details   Frequency (times per week)  3    Duration  Progress to 45 minutes of aerobic exercise without signs/symptoms of physical distress      Intensity   THRR 40-80% of Max Heartrate  105-128    Ratings of Perceived Exertion  11-15    Perceived Dyspnea  0-4      Resistance Training   Training Prescription  Yes    Weight  3 lb    Reps  10-15       Discharge  Exercise Prescription (Final Exercise Prescription Changes): Exercise Prescription Changes - 02/26/18 1100      Response to Exercise   Blood Pressure (Admit)  136/70    Blood Pressure (Exit)  138/78    Heart Rate (Admit)  88 bpm    Heart Rate (Exercise)  87 bpm    Heart Rate (Exit)  70 bpm    Oxygen Saturation (Admit)  94 %    Oxygen Saturation (Exercise)  94 %    Oxygen Saturation (Exit)  97 %    Rating of Perceived Exertion (Exercise)  13    Perceived Dyspnea (Exercise)  2  Symptoms  none    Duration  Continue with 45 min of aerobic exercise without signs/symptoms of physical distress.    Intensity  THRR unchanged   correct RPE range     Progression   Progression  Continue to progress workloads to maintain intensity without signs/symptoms of physical distress.    Average METs  2.4      Resistance Training   Training Prescription  Yes    Weight  3 lb    Reps  10-15      Interval Training   Interval Training  No      Recumbant Bike   Level  3    RPM  60    Watts  19    Minutes  15    METs  2.7      T5 Nustep   Level  5    SPM  80    Minutes  15    METs  2      Track   Laps  34    Minutes  15    METs  2.5       Functional Capacity: 6 Minute Walk    Row Name 11/13/17 1201         6 Minute Walk   Phase  Initial     Distance  1300 feet     Walk Time  6 minutes     # of Rest Breaks  0     MPH  2.46     METS  2.8     RPE  11     Perceived Dyspnea   1     VO2 Peak  9.8     Symptoms  No     Resting HR  82 bpm     Resting BP  124/64     Resting Oxygen Saturation   96 %     Exercise Oxygen Saturation  during 6 min walk  94 %     Max Ex. HR  108 bpm     Max Ex. BP  174/84     2 Minute Post BP  144/64       Interval HR   1 Minute HR  87     2 Minute HR  96     3 Minute HR  96     4 Minute HR  104     5 Minute HR  108     6 Minute HR  103     2 Minute Post HR  90     Interval Heart Rate?  Yes       Interval Oxygen   Interval Oxygen?  Yes      Baseline Oxygen Saturation %  96 %     1 Minute Oxygen Saturation %  94 %     1 Minute Liters of Oxygen  0 L     2 Minute Oxygen Saturation %  94 %     2 Minute Liters of Oxygen  0 L     3 Minute Oxygen Saturation %  94 %     3 Minute Liters of Oxygen  0 L     4 Minute Oxygen Saturation %  95 %     4 Minute Liters of Oxygen  0 L     5 Minute Oxygen Saturation %  94 %     5 Minute Liters of Oxygen  0 L     6 Minute Oxygen Saturation %  94 %     6 Minute Liters of Oxygen  0 L     2 Minute Post Oxygen Saturation %  97 %     2 Minute Post Liters of Oxygen  0 L        Psychological, QOL, Others - Outcomes: PHQ 2/9: Depression screen [facility] 2/9 02/18/2018 11/13/2017 04/10/2016 12/28/2015 11/23/2015  Decreased Interest 0 0 0 0 0  Down, Depressed, Hopeless 0 0 0 0 0  PHQ - 2 Score 0 0 0 0 0  Altered sleeping 0 0 0 - 0  Tired, decreased energy 1 0 0 - 0  Change in appetite 0 0 0 - 0  Feeling bad or failure about yourself  0 0 0 - 0  Trouble concentrating 0 0 0 - 0  Moving slowly or fidgety/restless 0 0 0 - 0  Suicidal thoughts 0 0 0 - 0  PHQ-9 Score 1 0 0 - 0  Difficult doing work/chores Not difficult at all Not difficult at all - - -    Quality of Life:   Personal Goals: Goals established at orientation with interventions provided to work toward goal. Personal Goals and Risk Factors at Admission - 11/13/17 1119      Core Components/Risk Factors/Patient Goals on Admission    Weight Management  Yes;Weight Maintenance    Intervention  Weight Management/Obesity: Establish reasonable short term and long term weight goals.;Weight Management: Provide education and appropriate resources to help participant work on and attain dietary goals.;Weight Management: Develop a combined nutrition and exercise program designed to reach desired caloric intake, while maintaining appropriate intake of nutrient and fiber, sodium and fats, and appropriate energy expenditure required for the weight goal.     Admit Weight  197 lb 11.2 oz (89.7 kg)    Goal Weight: Short Term  195 lb (88.5 kg)    Goal Weight: Long Term  195 lb (88.5 kg)    Expected Outcomes  Short Term: Continue to assess and modify interventions until short term weight is achieved;Long Term: Adherence to nutrition and physical activity/exercise program aimed toward attainment of established weight goal;Weight Maintenance: Understanding of the daily nutrition guidelines, which includes 25-35% calories from fat, 7% or less cal from saturated fats, less than '200mg'$  cholesterol, less than 1.5gm of sodium, & 5 or more servings of fruits and vegetables daily;Understanding recommendations for meals to include 15-35% energy as protein, 25-35% energy from fat, 35-60% energy from carbohydrates, less than '200mg'$  of dietary cholesterol, 20-35 gm of total fiber daily;Understanding of distribution of calorie intake throughout the day with the consumption of 4-5 meals/snacks    Improve shortness of breath with ADL's  Yes    Intervention  Provide education, individualized exercise plan and daily activity instruction to help decrease symptoms of SOB with activities of daily living.    Expected Outcomes  Short Term: Improve cardiorespiratory fitness to achieve a reduction of symptoms when performing ADLs;Long Term: Be able to perform more ADLs without symptoms or delay the onset of symptoms    Diabetes  Yes    Intervention  Provide education about signs/symptoms and action to take for hypo/hyperglycemia.;Provide education about proper nutrition, including hydration, and aerobic/resistive exercise prescription along with prescribed medications to achieve blood glucose in normal ranges: Fasting glucose 65-99 mg/dL    Expected Outcomes  Short Term: Participant verbalizes understanding of the signs/symptoms and immediate care of hyper/hypoglycemia, proper foot care and importance of medication, aerobic/resistive exercise and nutrition plan for blood glucose  control.;Long  Term: Attainment of HbA1C < 7%.    Hypertension  Yes    Intervention  Provide education on lifestyle modifcations including regular physical activity/exercise, weight management, moderate sodium restriction and increased consumption of fresh fruit, vegetables, and low fat dairy, alcohol moderation, and smoking cessation.;Monitor prescription use compliance.    Expected Outcomes  Short Term: Continued assessment and intervention until BP is < 140/68m HG in hypertensive participants. < 130/890mHG in hypertensive participants with diabetes, heart failure or chronic kidney disease.;Long Term: Maintenance of blood pressure at goal levels.    Lipids  Yes    Intervention  Provide education and support for participant on nutrition & aerobic/resistive exercise along with prescribed medications to achieve LDL '70mg'$ , HDL >'40mg'$ .    Expected Outcomes  Short Term: Participant states understanding of desired cholesterol values and is compliant with medications prescribed. Participant is following exercise prescription and nutrition guidelines.;Long Term: Cholesterol controlled with medications as prescribed, with individualized exercise RX and with personalized nutrition plan. Value goals: LDL < '70mg'$ , HDL > 40 mg.        Personal Goals Discharge: Goals and Risk Factor Review    Row Name 12/17/17 1647 01/25/18 1030 02/13/18 1435         Core Components/Risk Factors/Patient Goals Review   Personal Goals Review  Weight Management/Obesity;Improve shortness of breath with ADL's;Lipids;Hypertension;Diabetes  Weight Management/Obesity;Improve shortness of breath with ADL's;Lipids;Hypertension;Diabetes  Weight Management/Obesity;Improve shortness of breath with ADL's;Hypertension;Diabetes     Review  [place]. His blood sugar has been within normal limits. He wants to "keep moving" as he gets older and wants to be able to get around the house and do  things with his wife. His blood pressure has been under control with it being 130s/70s.  ViNarcisos atteneding [facility] regularly. He has been walking more laps walking and has been improving with his shortness of breath. He is still wants to keep moving and will exercise when he is done with the program.  [place] has been doing better since the start of the program. He manages his shortness of breath well. He checks his blood glucose at home and has been in good range for him. He knows he needs to keep exercising to keep up with his ADL's. ViFilimonas not lost weight since the start of the program but has increased in stamina. He states he could lose a few more pounds before the end of the program.     Expected Outcomes  Short: Attend [facility] regularly to improve shortness of breath with ADL's. Long: maintain independence with ADL's   Short: graduate [facility]. Long: maintain exercise to make ADL's easier.  Short: Lose 5 pounds. Long: maintain and continue weight loss.        Exercise Goals and Review: Exercise Goals    Row Name 11/13/17 1200             Exercise Goals   Increase Physical Activity  Yes       Intervention  Provide advice, education, support and counseling about physical activity/exercise needs.;Develop an individualized exercise prescription for aerobic and resistive training based on initial evaluation findings, risk stratification, comorbidities and participant's personal goals.       Expected Outcomes  Short Term: Attend rehab on a regular basis to increase amount of physical activity.;Long Term: Add in home exercise to make exercise part of routine and to increase amount  of physical activity.;Long Term: Exercising regularly at least 3-5 days a week.       Increase Strength and Stamina  Yes       Intervention  Provide advice, education, support and counseling about physical activity/exercise needs.;Develop an individualized  exercise prescription for aerobic and resistive training based on initial evaluation findings, risk stratification, comorbidities and participant's personal goals.       Expected Outcomes  Short Term: Increase workloads from initial exercise prescription for resistance, speed, and METs.;Short Term: Perform resistance training exercises routinely during rehab and add in resistance training at home;Long Term: Improve cardiorespiratory fitness, muscular endurance and strength as measured by increased METs and functional capacity (6MWT)       Able to understand and use rate of perceived exertion (RPE) scale  Yes       Intervention  Provide education and explanation on how to use RPE scale       Expected Outcomes  Short Term: Able to use RPE daily in rehab to express subjective intensity level;Long Term:  Able to use RPE to guide intensity level when exercising independently       Able to understand and use Dyspnea scale  Yes       Intervention  Provide education and explanation on how to use Dyspnea scale       Expected Outcomes  Short Term: Able to use Dyspnea scale daily in rehab to express subjective sense of shortness of breath during exertion;Long Term: Able to use Dyspnea scale to guide intensity level when exercising independently       Knowledge and understanding of Target Heart Rate Range (THRR)  Yes       Intervention  Provide education and explanation of THRR including how the numbers were predicted and where they are located for reference       Expected Outcomes  Short Term: Able to state/look up THRR;Short Term: Able to use daily as guideline for intensity in rehab;Long Term: Able to use THRR to govern intensity when exercising independently       Able to check pulse independently  Yes       Intervention  Provide education and demonstration on how to check pulse in carotid and radial arteries.;Review the importance of being able to check your own pulse for safety during independent exercise        Expected Outcomes  Short Term: Able to explain why pulse checking is important during independent exercise;Long Term: Able to check pulse independently and accurately       Understanding of Exercise Prescription  Yes       Intervention  Provide education, explanation, and written materials on patient's individual exercise prescription       Expected Outcomes  Short Term: Able to explain program exercise prescription;Long Term: Able to explain home exercise prescription to exercise independently          Exercise Goals Re-Evaluation: Exercise Goals Re-Evaluation    Row Name 11/19/17 1016 11/23/17 1044 12/05/17 1224 12/19/17 1136 01/16/18 1239     Exercise Goal Re-Evaluation   Exercise Goals Review  Increase Physical Activity;Increase Strength and Stamina;Able to understand and use rate of perceived exertion (RPE) scale;Knowledge and understanding of Target Heart Rate Range (THRR);Understanding of Exercise Prescription;Able to understand and use Dyspnea scale  Increase Physical Activity;Able to understand and use rate of perceived exertion (RPE) scale;Knowledge and understanding of Target Heart Rate Range (THRR);Increase Strength and Stamina;Able to understand and use Dyspnea scale  Increase Physical Activity;Increase Strength and  Stamina;Able to understand and use rate of perceived exertion (RPE) scale;Able to understand and use Dyspnea scale  Increase Physical Activity;Increase Strength and Stamina;Able to understand and use rate of perceived exertion (RPE) scale;Able to understand and use Dyspnea scale  Increase Physical Activity;Increase Strength and Stamina;Able to understand and use rate of perceived exertion (RPE) scale;Able to understand and use Dyspnea scale;Knowledge and understanding of Target Heart Rate Range (THRR);Understanding of Exercise Prescription   Comments  Reviewed RPE scale, THR and program prescription with pt today.  Pt voiced understanding and was given a copy of goals to take  home.   Reviewed home exercise with pt today.  Pt plans to go to [facility] for exercise.  Reviewed THR, pulse, RPE, sign and symptoms, NTG use, and when to call 911 or MD.  Also discussed weather considerations and indoor options.  Pt voiced understanding.  Ayyan tolerates exercise well.  He walks consistently 34-37 laps around the track.  Dayshon is graudally increasing laps walked and overall METs.  He will miss a couple [place] going [place] his [place].  Keiandre has maintained fitness during his vacation in October. Staff will encourage increasing weight strength training and on machines.    Expected Outcomes  Short: Use RPE daily to regulate intensity. Long: Follow program prescription in THR.  Short - exercise 3 days per week between LW and home Long - exercise 3 days on his own   Short - continue to attend consistently, consider 4 lb for strength work Long - increase from current MET level  Short - exercise while on vacation Long - increase MET level  Short - inc to 4 lb for strength work Long - increase MET level   [place] Name 01/30/18 1153 02/13/18 1220 02/26/18 1129         Exercise Goal Re-Evaluation   Exercise Goals Review  Increase Physical Activity;Increase Strength and Stamina;Able to understand and use rate of perceived exertion (RPE) scale;Able to understand and use Dyspnea scale;Knowledge and understanding of Target Heart Rate Range (THRR);Understanding of Exercise Prescription  Increase Physical Activity;Increase Strength and Stamina;Able to understand and use rate of perceived exertion (RPE) scale;Able to understand and use Dyspnea scale;Knowledge and understanding of Target Heart Rate Range (THRR);Understanding of Exercise Prescription  Increase Physical Activity;Increase Strength and Stamina;Able to understand and use rate of perceived exertion (RPE) scale;Able to understand and use Dyspnea scale;Knowledge and understanding of Target Heart Rate Range (THRR);Understanding of Exercise  Prescription     Comments  Cedarius has increased level on T5 NS.  Overall METS slightly increased.  Staff will monitor progress.  Lafe works consistently at [employer].  He has increased endurance walking.  Staff will encourage increase of weight to 4 lb  Sid is maintaining MET level.  He works in correct RPE level and does well with 45 min of cardio.     Expected Outcomes  Short - attend consistently Long - increase MET level  SHort - increase to 4 lb strength training Long - increase MET level  Short - complete LW program Long - maintain exercise on his own        Nutrition & Weight - Outcomes: Pre Biometrics - 11/13/17 1157      Pre Biometrics   Height  5' 10.5" (1.791 m)    Weight  197 lb 11.2 oz (89.7 kg)    Waist Circumference  42.5 inches    Hip Circumference  43 inches    Waist to Hip Ratio  0.99 %  BMI (Calculated)  27.96      Post Biometrics - 03/01/18 1049       Post  Biometrics   Height  5' 10.5" (1.791 m)    Weight  196 lb (88.9 kg)    Waist Circumference  42 inches    Hip Circumference  45 inches    Waist to Hip Ratio  0.93 %    BMI (Calculated)  27.72       Nutrition: Nutrition Therapy & Goals - 11/19/17 1043      Nutrition Therapy   RD appointment deferred  Yes   reports his wife to be monitoring his diet      Nutrition Discharge: Nutrition Assessments - 11/13/17 1100      MEDFICTS Scores   Pre Score  33       Education Questionnaire Score: Knowledge Questionnaire Score - 02/18/18 1044      Knowledge Questionnaire Score   Pre Score  15/18    Post Score  17/18   reviewed with patient      Goals reviewed with patient; copy given to patient.

## 2018-03-01 NOTE — Progress Notes (Signed)
Daily Session Note  Patient Details  Name: Jeremiah Gibson MRN: [id number] Date of Birth: [birth year] Referring Provider:     Pulmonary Rehab from 11/13/2017 in [facility] Cardiac and Pulmonary Rehab  Referring Provider  Alba      Encounter Date: 03/01/2018  Check In: Session Check In - 03/01/18 1011      Check-In   Supervising physician immediately available to respond to emergencies  [facility] immediately available ER MD    Physician(s)  Dr. Corky Downs and [facility]    Location  [facility]-Cardiac & Pulmonary Rehab    Staff Present  Renita Papa, RN Vickki Hearing, BA, ACSM CEP, Exercise Physiologist;Joseph Tessie Fass RCP,RRT,BSRT    Medication changes reported      No    Fall or balance concerns reported     No    Tobacco Cessation  No Change    Warm-up and Cool-down  Performed as group-led instruction    Resistance Training Performed  Yes    VAD Patient?  No    PAD/SET Patient?  No      Pain Assessment   Currently in Pain?  No/denies          Social History   Tobacco Use  Smoking Status Former Smoker  . Packs/day: 2.00  . Years: 20.00  . Pack years: 40.00  . Types: Cigarettes, Pipe  . Last attempt to quit: 11/22/1999  . Years since quitting: 18.2  Smokeless Tobacco Never Used  Tobacco Comment   quit cigs in 72 and pipe in 01    Goals Met:  Proper associated with RPD/PD & O2 Sat Independence with exercise equipment Exercise tolerated well No report of cardiac concerns or symptoms Strength training completed today  Goals Unmet:  Not Applicable  Comments:  Jeremiah Gibson graduated today from  rehab with 33 sessions completed.  Details of the patient's exercise prescription and what He needs to do in order to continue the prescription and progress were discussed with patient.  Patient was given a copy of prescription and goals.  Patient verbalized understanding.  Jeremiah Gibson plans to continue to exercise by exercising at [facility].    Dr. Emily Filbert is Medical Director for [facility] and [facility].

## 2018-04-24 ENCOUNTER — Ambulatory Visit (INDEPENDENT_AMBULATORY_CARE_PROVIDER_SITE_OTHER): Payer: Medicare Other | Admitting: Pulmonary Disease

## 2018-04-24 ENCOUNTER — Encounter: Payer: Self-pay | Admitting: Pulmonary Disease

## 2018-04-24 VITALS — BP 124/72 | HR 86 | Ht 70.0 in | Wt 195.0 lb

## 2018-04-24 DIAGNOSIS — J84112 Idiopathic pulmonary fibrosis: Secondary | ICD-10-CM

## 2018-04-24 DIAGNOSIS — J4520 Mild intermittent asthma, uncomplicated: Secondary | ICD-10-CM | POA: Diagnosis not present

## 2018-04-24 LAB — PULMONARY FUNCTION TEST
DL/VA % PRED: 89 %
DL/VA: 3.49 ml/min/mmHg/L
DLCO UNC: 17.69 ml/min/mmHg
DLCO unc % pred: 72 %
FEF 25-75 POST: 2.52 L/s
FEF 25-75 PRE: 2.9 L/s
FEF2575-%CHANGE-POST: -13 %
FEF2575-%PRED-PRE: 146 %
FEF2575-%Pred-Post: 127 %
FEV1-%Change-Post: -2 %
FEV1-%PRED-PRE: 98 %
FEV1-%Pred-Post: 95 %
FEV1-PRE: 2.83 L
FEV1-Post: 2.75 L
FEV1FVC-%CHANGE-POST: 2 %
FEV1FVC-%Pred-Pre: 112 %
FEV6-%CHANGE-POST: -5 %
FEV6-%PRED-POST: 88 %
FEV6-%Pred-Pre: 93 %
FEV6-Post: 3.34 L
FEV6-Pre: 3.52 L
FEV6FVC-%Pred-Post: 107 %
FEV6FVC-%Pred-Pre: 107 %
FVC-%Change-Post: -5 %
FVC-%Pred-Post: 82 %
FVC-%Pred-Pre: 87 %
FVC-Post: 3.34 L
FVC-Pre: 3.52 L
POST FEV1/FVC RATIO: 82 %
POST FEV6/FVC RATIO: 100 %
PRE FEV1/FVC RATIO: 80 %
Pre FEV6/FVC Ratio: 100 %

## 2018-04-24 LAB — HEPATIC FUNCTION PANEL
ALT: 15 U/L (ref 0–53)
AST: 17 U/L (ref 0–37)
Albumin: 4 g/dL (ref 3.5–5.2)
Alkaline Phosphatase: 69 U/L (ref 39–117)
BILIRUBIN DIRECT: 0 mg/dL (ref 0.0–0.3)
BILIRUBIN TOTAL: 0.5 mg/dL (ref 0.2–1.2)
TOTAL PROTEIN: 7.1 g/dL (ref 6.0–8.3)

## 2018-04-24 MED ORDER — NINTEDANIB ESYLATE 100 MG PO CAPS: 100.0000 mg | ORAL_CAPSULE | Freq: Two times a day (BID) | ORAL | 5 refills | Status: DC

## 2018-04-24 NOTE — Assessment & Plan Note (Signed)
Albuterol prn

## 2018-04-24 NOTE — Progress Notes (Signed)
   Subjective:    Patient ID: Jeremiah Gibson, male    DOB: 1935/06/11, 83 y.o.   MRN: [id number]  HPI  83 y.o remote ex smoker for FU of cough variant asthma &ILD  He was evaluated by Pulmonology in [place] in 2008 for cough &wheezing &diagnosed with asthma. Review of his spirometry records essentially show mild restriction with FEV1 in the 72% range and normal FEV1 ratio. However spirometry from 08/2006 does show significant bronchodilator response which could be consistent with some reversible disease. On this basis, heis maintained on a regimen of Advair 250/50 twice a day and albuterol as needed for rescue . Allergy testing was negative.   On his prior visits FVC was noted to drop down from 89% to 73% and hence I was concerned about dropping lung function.  But on his PFTs today FVC is back up to 87%.  DLCO surprisingly very high at 72% which makes me suspect that this may be another  On his last visit we had increased his O FEV back to 150 mg but he complains of increased diarrhea His dyspnea otherwise is at baseline and cough is no worse  LFTs 01/2018 was normal   Significant tests/ events  2013 HRCT >>Mild subpleural reticulation/fibrosis, nonspecific. No definite honeycombing . Mild bilateral lower lobe bronchiectasis   HRCT 07/2015 >> UIP pattern, worse  RAST -neg   PFTs 09/2011 - no airway obstruction, nml lung volumes, DLCO 64%   PFTs 11/2012 -slight drop in FVC from 3.8 to 3.5, DLCO increased to 17.9 (57%), TLC dropped to 4.95(76%)  PFT 07/2015 FVC 86%, TLC 67%, DLCO 57% (stable)  PFT 09/2016 FVC 87%, DLCO 59%-stable Spirometry 03/2017 shows FVC of 81% PFTs 04/2018 FVC 87%, DLCO 72%  Review of Systems neg for any significant sore throat, dysphagia, itching, sneezing, nasal congestion or excess/ purulent secretions, fever, chills, sweats, unintended wt loss, pleuritic or exertional cp, hempoptysis, orthopnea pnd or change in chronic leg swelling. Also denies  presyncope, palpitations, heartburn, abdominal pain, nausea, vomiting, diarrhea or change in bowel or urinary habits, dysuria,hematuria, rash, arthralgias, visual complaints, headache, numbness weakness or ataxia.     Objective:   Physical Exam   Gen. Pleasant, well-nourished, in no distress ENT - no thrush, no pallor/icterus,no post nasal drip Neck: No JVD, no thyromegaly, no carotid bruits Lungs: no use of accessory muscles, no dullness to percussion, bibasal 1/3 rales no rhonchi  Cardiovascular: Rhythm regular, heart sounds  normal, no murmurs or gallops, no peripheral edema Musculoskeletal: No deformities, no cyanosis or clubbing         Assessment & Plan:

## 2018-04-24 NOTE — Assessment & Plan Note (Signed)
His FVC seems to have recovered so the earlier drop may have been related to reactive airways disease, DLCO surprisingly high which may be an error. He is not tolerating the increased dose of all FEV at all. We will drop back down to 100 twice daily. We again discussed pirfenidone but he does not like the idea of taking a medication 3 times daily  We will check LFTs again today

## 2018-04-24 NOTE — Progress Notes (Signed)
Spiro pre and post with DLCO completed today. Cliffton Spradley,CMA  

## 2018-04-24 NOTE — Patient Instructions (Signed)
LFTs today Lung function is stable Decrease OFEV to 100 twice daily - send Rx

## 2018-04-26 ENCOUNTER — Telehealth: Payer: Self-pay | Admitting: Pulmonary Disease

## 2018-04-26 NOTE — Telephone Encounter (Signed)
Oretha Milch, MD  Maurene Capes, CMA        Normal LFTs    Pt is aware of results and voiced his understanding.  Nothing further is needed.

## 2018-04-29 ENCOUNTER — Other Ambulatory Visit: Payer: Self-pay | Admitting: Family Medicine

## 2018-04-29 ENCOUNTER — Telehealth: Payer: Self-pay | Admitting: Pulmonary Disease

## 2018-04-29 DIAGNOSIS — J84112 Idiopathic pulmonary fibrosis: Secondary | ICD-10-CM

## 2018-04-29 MED ORDER — NINTEDANIB ESYLATE 100 MG PO CAPS: 100.0000 mg | ORAL_CAPSULE | Freq: Two times a day (BID) | ORAL | 5 refills | Status: DC

## 2018-04-29 NOTE — Telephone Encounter (Signed)
[employer], [phone], spoke with Lurena Joiner.  She stated that they needed a new Ofev prescription to be sent to fax # [phone], which is non specialty side, not mail order.  New prescription for Ofev sent to requested fax number.  Nothing further at this time.  Per Dr Vassie Loll 04/24/18 Instructions      Return in about 3 months (around 07/23/2018).  LFTs today Lung function is stable Decrease OFEV to 100 twice daily - send Rx

## 2018-07-08 ENCOUNTER — Other Ambulatory Visit: Payer: Self-pay | Admitting: Pulmonary Disease

## 2018-07-16 DIAGNOSIS — K529 Noninfective gastroenteritis and colitis, unspecified: Secondary | ICD-10-CM | POA: Diagnosis not present

## 2018-07-16 DIAGNOSIS — K219 Gastro-esophageal reflux disease without esophagitis: Secondary | ICD-10-CM | POA: Diagnosis not present

## 2018-07-21 ENCOUNTER — Other Ambulatory Visit: Payer: Self-pay | Admitting: Family Medicine

## 2018-07-21 DIAGNOSIS — M109 Gout, unspecified: Secondary | ICD-10-CM

## 2018-07-29 ENCOUNTER — Ambulatory Visit: Payer: Self-pay | Admitting: Pulmonary Disease

## 2018-08-09 DIAGNOSIS — R197 Diarrhea, unspecified: Secondary | ICD-10-CM | POA: Diagnosis not present

## 2018-08-27 DIAGNOSIS — R197 Diarrhea, unspecified: Secondary | ICD-10-CM | POA: Diagnosis not present

## 2018-08-27 DIAGNOSIS — R634 Abnormal weight loss: Secondary | ICD-10-CM | POA: Diagnosis not present

## 2018-08-27 DIAGNOSIS — R194 Change in bowel habit: Secondary | ICD-10-CM | POA: Diagnosis not present

## 2018-08-28 ENCOUNTER — Other Ambulatory Visit: Payer: Self-pay | Admitting: Gastroenterology

## 2018-08-28 ENCOUNTER — Other Ambulatory Visit (HOSPITAL_COMMUNITY): Payer: Self-pay | Admitting: Gastroenterology

## 2018-08-28 DIAGNOSIS — R634 Abnormal weight loss: Secondary | ICD-10-CM | POA: Diagnosis not present

## 2018-08-28 DIAGNOSIS — R194 Change in bowel habit: Secondary | ICD-10-CM | POA: Diagnosis not present

## 2018-08-28 DIAGNOSIS — R197 Diarrhea, unspecified: Secondary | ICD-10-CM

## 2018-09-04 ENCOUNTER — Other Ambulatory Visit: Payer: Self-pay

## 2018-09-04 ENCOUNTER — Ambulatory Visit
Admission: RE | Admit: 2018-09-04 | Discharge: 2018-09-04 | Disposition: A | Discharge status: Home / Self Care | Payer: Medicare Other | Source: Ambulatory Visit | Attending: Gastroenterology | Admitting: Gastroenterology

## 2018-09-04 DIAGNOSIS — R634 Abnormal weight loss: Secondary | ICD-10-CM | POA: Diagnosis not present

## 2018-09-04 DIAGNOSIS — R197 Diarrhea, unspecified: Secondary | ICD-10-CM | POA: Diagnosis not present

## 2018-09-04 MED ORDER — IOHEXOL 300 MG/ML  SOLN
100.0000 mL | Freq: Once | INTRAMUSCULAR | Status: AC | PRN
  Administered 2018-09-04: 100 mL via INTRAVENOUS

## 2018-09-10 ENCOUNTER — Other Ambulatory Visit: Payer: Self-pay

## 2018-09-10 ENCOUNTER — Encounter: Payer: Self-pay | Admitting: Nurse Practitioner

## 2018-09-10 ENCOUNTER — Ambulatory Visit: Payer: Medicare Other | Admitting: Nurse Practitioner

## 2018-09-10 VITALS — BP 126/68 | HR 71 | Temp 98.3°F | Ht 70.5 in | Wt 190.2 lb

## 2018-09-10 DIAGNOSIS — Z5181 Encounter for therapeutic drug level monitoring: Secondary | ICD-10-CM | POA: Diagnosis not present

## 2018-09-10 DIAGNOSIS — J84112 Idiopathic pulmonary fibrosis: Secondary | ICD-10-CM | POA: Diagnosis not present

## 2018-09-10 LAB — HEPATIC FUNCTION PANEL
ALT: 12 U/L (ref 0–53)
AST: 15 U/L (ref 0–37)
Albumin: 4 g/dL (ref 3.5–5.2)
Alkaline Phosphatase: 66 U/L (ref 39–117)
Bilirubin, Direct: 0.1 mg/dL (ref 0.0–0.3)
Total Bilirubin: 0.4 mg/dL (ref 0.2–1.2)
Total Protein: 6.8 g/dL (ref 6.0–8.3)

## 2018-09-10 MED ORDER — CETIRIZINE HCL 10 MG PO TABS: 10.0000 mg | ORAL_TABLET | Freq: Every day | ORAL | 0 refills | Status: AC

## 2018-09-10 NOTE — Patient Instructions (Addendum)
May stop Ofev for 2 weeks, then restart once daily for 2 weeks Will order zyrtec May continue flonase Continue imodium as needed  Will order LFT's and call with results  Follow up: Follow up with Dr. [name] in 3-4 weeks or sooner if needed

## 2018-09-10 NOTE — Progress Notes (Signed)
@Patient  ID: Jeremiah Gibson, male    DOB: February 19, 1936, 83 y.o.   MRN: [id number]  Chief Complaint  Patient presents with  . Follow-up    IPF    Referring provider: Birdie Sons, MD  HPI  83 year old male former smoker with cough variant asthma and ILD who is followed by Dr. [name].  Tests: 2013 HRCT >>Mild subpleural reticulation/fibrosis, nonspecific. No definite honeycombing . Mild bilateral lower lobe bronchiectasis   HRCT 07/2015 >> UIP pattern, worse  RAST -neg   PFTs 09/2011 - no airway obstruction, nml lung volumes, DLCO 64%   PFTs 11/2012 -slight drop in FVC from 3.8 to 3.5, DLCO increased to 17.9 (57%), TLC dropped to 4.95(76%)  PFT 07/2015 FVC 86%, TLC 67%, DLCO 57% (stable)  PFT 09/2016 FVC 87%, DLCO 59%-stable Spirometry1/2019shows FVC of 81% PFTs 04/2018 FVC 87%, DLCO 72%  OV 09/11/18 - Follow up  Patient presents today for 56-month follow-up.  He was last seen by Dr. [name] on 04/24/2018.  Patient is on Ofev and at his last visit his dosage was decreased to 100 mg twice daily due to diarrhea.  Patient states that over the past month his diarrhea has worsened.  Is currently taking Imodium with minimal relief noted.  Patient has lost 5 pounds since February.  He states that his breathing is stable.  He denies any significant shortness of breath with activities of daily living.  He denies any significant cough.  He does state that recently he has had some postnasal drip with frequent throat clearing.  He is not on any antihistamines at this time. Denies f/c/s, n/v/d, hemoptysis, PND, leg swelling.     No Known Allergies  Immunization History  Administered Date(s) Administered  . Influenza Split 01/01/2009, 11/23/2009, 01/10/2011, 12/23/2011  . Influenza Whole 12/12/2010, 12/12/2011  . Influenza, High Dose Seasonal PF 12/30/2013, 12/28/2015, 11/21/2016, 11/26/2017  . Influenza,inj,Quad PF,6+ Mos 11/25/2012  . Influenza-Unspecified 01/12/2015  . Pneumococcal  Conjugate-13 10/06/2013  . Pneumococcal Polysaccharide-23 03/14/2007  . Td 01/01/2009  . Tdap 10/12/2011  . Zoster 06/30/2010  . Zoster Recombinat (Shingrix) 01/28/2018    Past Medical History:  Diagnosis Date  . Anemia   . Arthritis   . Asthma   . Colon polyps   . Cough    CHRONIC  . Diabetes mellitus without complication ([place])   . Diverticulosis of colon (without mention of hemorrhage)   . Dyspnea    pulmonary fibrosis  . Dysrhythmia   . Esophageal reflux   . Gout   . Hyperlipidemia   . Hypertension   . Impotence of organic origin   . Leg cramps   . Pulmonary fibrosis ([place])   . Pulmonary fibrosis ([place])   . Wheezing     Tobacco History: Social History   Tobacco Use  Smoking Status Former Smoker  . Packs/day: 2.00  . Years: 20.00  . Pack years: 40.00  . Types: Cigarettes, Pipe  . Quit date: 11/22/1999  . Years since quitting: 18.8  Smokeless Tobacco Never Used  Tobacco Comment   quit cigs in 70 and pipe in 01   Counseling given: Yes Comment: quit cigs in 70 and pipe in 01   Outpatient Encounter Medications as of 09/10/2018  Medication Sig  . albuterol (VENTOLIN HFA) 108 (90 Base) MCG/ACT inhaler INHALE 2 PUFFS BY MOUTH INTO THE LUNGS EVERY 4 HOURS AS NEEDED FOR WHEEZING OR SHORTNESS OF BREATH  . allopurinol (ZYLOPRIM) 100 MG tablet TAKE 2 TABLETS BY MOUTH TWICE DAILY. GENERIC  EQUIVALENT FOR ZYLOPRIM  . amLODipine (NORVASC) 2.5 MG tablet Take 1 tablet (2.5 mg total) by mouth daily.  Marland Kitchen. aspirin 81 MG tablet Take 81 mg by mouth every evening.   . clopidogrel (PLAVIX) 75 MG tablet TAKE 1 TABLET BY MOUTH DAILY GENERIC EQUIVALENT FOR PLAVIX  . finasteride (PROSCAR) 5 MG tablet TAKE 1 TABLET BY MOUTH EVERY DAY. GENERIC EQUIVALENT FOR PROSCAR  . fish oil-omega-3 fatty acids 1000 MG capsule Take 1 g by mouth 2 (two) times daily.   . fluticasone (FLONASE) 50 MCG/ACT nasal spray Place 2 sprays into both nostrils daily.  . Fluticasone-Salmeterol (ADVAIR DISKUS) 250-50  MCG/DOSE AEPB Inhale 1 puff into the lungs 2 (two) times daily.  Marland Kitchen. glipiZIDE (GLUCOTROL XL) 5 MG 24 hr tablet TAKE 1 TABLET BY MOUTH DAILY WITH BREAKFAST  . glucose blood (ONE TOUCH ULTRA TEST) test strip Use as instructed to check blood sugar daily. Dx: E11.21  . losartan (COZAAR) 100 MG tablet TAKE 1 TABLET BY MOUTH EVERY DAY FOR BLOOD PRESSURE  . Magnesium 400 MG TABS Take 400 mg by mouth 2 (two) times daily.   . metFORMIN (GLUCOPHAGE) 500 MG tablet TAKE 2 TABLETS BY MOUTH TWICE DAILY WITH A MEAL  . MULTIPLE VITAMIN PO MULTIVITAMINS (Oral Tablet)  1 Every Day for 0 days  Quantity: 0.00;  Refills: 0   Ordered :06-Jan-2010  [name], [name] ;  Started 22-Mar-2009 Active  . Nintedanib (OFEV) 100 MG CAPS Take 1 capsule (100 mg total) by mouth 2 (two) times daily.  . pantoprazole (PROTONIX) 20 MG tablet Take 20 mg by mouth 2 (two) times daily.  . polyethylene glycol-electrolytes (NULYTELY/GOLYTELY) 420 g solution Take 4,000 mLs by mouth once.  . potassium chloride SA (K-DUR,KLOR-CON) 20 MEQ tablet TAKE 1 TABLET BY MOUTH DAILY  . pravastatin (PRAVACHOL) 40 MG tablet TAKE 1 TABLET BY MOUTH DAILY  . terazosin (HYTRIN) 5 MG capsule TAKE 1 CAPSULE BY MOUTH EVERY DAY FOR PROSTATE  . triamterene-hydrochlorothiazide (MAXZIDE-25) 37.5-25 MG tablet TAKE 1 TABLET BY MOUTH EVERY DAY FOR BLOOD PRESSURE GENERIC EQUIVALENT FOR MAXZIDE-25  . cetirizine (ZYRTEC ALLERGY) 10 MG tablet Take 1 tablet (10 mg total) by mouth daily.   No facility-administered encounter medications on file as of 09/10/2018.      Review of Systems  Review of Systems  Constitutional: Negative.  Negative for chills and fever.  HENT: Negative.   Respiratory: Negative for cough, shortness of breath and wheezing.   Cardiovascular: Negative.  Negative for chest pain, palpitations and leg swelling.  Gastrointestinal: Positive for diarrhea.  Allergic/Immunologic: Negative.   Neurological: Negative.   Psychiatric/Behavioral: Negative.         Physical Exam  BP 126/68 (BP Location: Left Arm, Patient Position: Sitting, Cuff Size: Normal)   Pulse 71   Temp 98.3 F (36.8 C)   Ht 5' 10.5" (1.791 m)   Wt 190 lb 3.2 oz (86.3 kg)   SpO2 98%   BMI 26.91 kg/m   Wt Readings from Last 5 Encounters:  09/10/18 190 lb 3.2 oz (86.3 kg)  04/24/18 195 lb (88.5 kg)  03/01/18 196 lb (88.9 kg)  01/22/18 198 lb 3.2 oz (89.9 kg)  11/26/17 196 lb (88.9 kg)     Physical Exam Vitals signs and nursing note reviewed.  Constitutional:      General: He is not in acute distress.    Appearance: He is well-developed.  Cardiovascular:     Rate and Rhythm: Normal rate and regular rhythm.  Pulmonary:  Effort: Pulmonary effort is normal. No respiratory distress.     Breath sounds: Normal breath sounds. No wheezing or rhonchi.     Comments: Bilateral crackles to bases.  Skin:    General: Skin is warm and dry.  Neurological:     Mental Status: He is alert and oriented to person, place, and time.      Imaging: Ct Abdomen Pelvis W Contrast  Result Date: 09/05/2018 CLINICAL DATA:  Diarrhea. EXAM: CT ABDOMEN AND PELVIS WITH CONTRAST TECHNIQUE: Multidetector CT imaging of the abdomen and pelvis was performed using the standard protocol following bolus administration of intravenous contrast. CONTRAST:  100mL OMNIPAQUE IOHEXOL 300 MG/ML  SOLN COMPARISON:  CT scan of December 19, 2010. FINDINGS: Lower chest: No acute abnormality. Hepatobiliary: No gallstones or biliary dilatation is noted. Hepatic steatosis is noted. Pancreas: Unremarkable. No pancreatic ductal dilatation or surrounding inflammatory changes. Spleen: Normal in size without focal abnormality. Adrenals/Urinary Tract: Adrenal glands appear normal. Bilateral renal cysts are noted. No hydronephrosis or renal obstruction is noted. No renal or ureteral calculi are noted. Urinary bladder is unremarkable. Stomach/Bowel: The stomach appears normal. Sigmoid diverticulosis is noted without  inflammation. Status post appendectomy. No evidence of bowel obstruction. Vascular/Lymphatic: Aortic atherosclerosis. No enlarged abdominal or pelvic lymph nodes. Reproductive: Moderate prostatic enlargement is noted. Other: 4.1 x 2.8 cm round low density is noted in left retroperitoneal region which is decreased compared to prior exam and most likely represents sequela of prior pancreatic pseudocyst. No hernia is noted. Musculoskeletal: No acute or significant osseous findings. IMPRESSION: 4.1 x 2.8 cm rounded low density is noted in left retroperitoneal region below the pancreas, which is decreased in size compared to prior exam and most likely represents sequela of prior pancreatic pseudocyst. No acute pancreatic abnormality is noted currently. Hepatic steatosis. Moderate prostatic enlargement. Aortic Atherosclerosis (ICD10-I70.0). Electronically Signed   By: Lupita RaiderJames  Green Jr M.D.   On: 09/05/2018 09:28     Assessment & Plan:   IPF (idiopathic pulmonary fibrosis) (HCC) Patient presents today for 5312-month follow-up.  He was last seen by Dr. Vassie LollAlva on 04/24/2018.  Patient is on Ofev and at his last visit his dosage was decreased to 100 mg twice daily due to diarrhea.  Patient states that over the past month his diarrhea has worsened.  Is currently taking Imodium with minimal relief noted.  Patient has lost 5 pounds since February.  He states that his breathing is stable.  He denies any significant shortness of breath with activities of daily living.  He denies any significant cough.  He does state that recently he has had some postnasal drip with frequent throat clearing.  He is not on any antihistamines at this time.  Plan: Patient Instructions  May stop Ofev for 2 weeks, then restart once daily for 2 weeks Will order zyrtec May continue flonase Continue imodium as needed  Will order LFT's and call with results  Follow up: Follow up with Dr. Vassie LollAlva in 3-4 weeks or sooner if needed        Ivonne Andrewonya S  Jadie Allington, NP 09/11/2018

## 2018-09-11 ENCOUNTER — Encounter: Payer: Self-pay | Admitting: Nurse Practitioner

## 2018-09-11 NOTE — Assessment & Plan Note (Signed)
Patient presents today for 76-month follow-up.  He was last seen by Dr. [name] on 04/24/2018.  Patient is on Ofev and at his last visit his dosage was decreased to 100 mg twice daily due to diarrhea.  Patient states that over the past month his diarrhea has worsened.  Is currently taking Imodium with minimal relief noted.  Patient has lost 5 pounds since February.  He states that his breathing is stable.  He denies any significant shortness of breath with activities of daily living.  He denies any significant cough.  He does state that recently he has had some postnasal drip with frequent throat clearing.  He is not on any antihistamines at this time.  Plan: Patient Instructions  May stop Ofev for 2 weeks, then restart once daily for 2 weeks Will order zyrtec May continue flonase Continue imodium as needed  Will order LFT's and call with results  Follow up: Follow up with Dr. [name] in 3-4 weeks or sooner if needed

## 2018-09-16 ENCOUNTER — Other Ambulatory Visit: Payer: Self-pay | Admitting: Family Medicine

## 2018-09-16 DIAGNOSIS — I10 Essential (primary) hypertension: Secondary | ICD-10-CM

## 2018-09-16 DIAGNOSIS — N400 Enlarged prostate without lower urinary tract symptoms: Secondary | ICD-10-CM

## 2018-09-18 ENCOUNTER — Other Ambulatory Visit: Payer: Self-pay | Admitting: Family Medicine

## 2018-09-18 DIAGNOSIS — N400 Enlarged prostate without lower urinary tract symptoms: Secondary | ICD-10-CM

## 2018-09-18 DIAGNOSIS — I10 Essential (primary) hypertension: Secondary | ICD-10-CM

## 2018-10-02 ENCOUNTER — Other Ambulatory Visit: Payer: Self-pay | Admitting: Family Medicine

## 2018-10-02 DIAGNOSIS — B351 Tinea unguium: Secondary | ICD-10-CM | POA: Diagnosis not present

## 2018-10-02 DIAGNOSIS — E871 Hypo-osmolality and hyponatremia: Secondary | ICD-10-CM

## 2018-10-02 DIAGNOSIS — L851 Acquired keratosis [keratoderma] palmaris et plantaris: Secondary | ICD-10-CM | POA: Diagnosis not present

## 2018-10-02 DIAGNOSIS — E1140 Type 2 diabetes mellitus with diabetic neuropathy, unspecified: Secondary | ICD-10-CM | POA: Diagnosis not present

## 2018-10-11 ENCOUNTER — Telehealth: Payer: Self-pay | Admitting: Pulmonary Disease

## 2018-10-11 NOTE — Telephone Encounter (Signed)
Medication name and strength: OFEV 100mg  Provider: Dr. [name] Pharmacy: [facility] Patient insurance ID: [id number] Phone: [phone] Fax: [phone]  Was the PA started on CMM?  yes If yes, please enter the Key: [id number] Timeframe for approval/denial: Your information has been submitted to [place]. [employer] will review the request and notify you of the determination decision directly, typically within 3 business days of your submission and once all necessary information is received.  You will also receive your request decision electronically. To check for an update later, open the request again from your dashboard.

## 2018-10-11 NOTE — Telephone Encounter (Signed)
[facility] pharmacy to let them know patient was approved, was on hold for 10 minutes.

## 2018-10-11 NOTE — Telephone Encounter (Signed)
[facility] calling  Stating that he PA has been approved for ofev for 64yr questions call [phone].Hillery Hunter

## 2018-10-14 ENCOUNTER — Encounter: Payer: Self-pay | Admitting: Family Medicine

## 2018-10-14 ENCOUNTER — Ambulatory Visit: Payer: Medicare Other | Admitting: Family Medicine

## 2018-10-14 ENCOUNTER — Telehealth: Payer: Self-pay | Admitting: Pulmonary Disease

## 2018-10-14 ENCOUNTER — Other Ambulatory Visit: Payer: Self-pay

## 2018-10-14 VITALS — BP 118/68 | HR 64 | Temp 98.0°F | Wt 193.0 lb

## 2018-10-14 DIAGNOSIS — E1121 Type 2 diabetes mellitus with diabetic nephropathy: Secondary | ICD-10-CM

## 2018-10-14 DIAGNOSIS — I10 Essential (primary) hypertension: Secondary | ICD-10-CM

## 2018-10-14 DIAGNOSIS — D649 Anemia, unspecified: Secondary | ICD-10-CM

## 2018-10-14 DIAGNOSIS — I2510 Atherosclerotic heart disease of native coronary artery without angina pectoris: Secondary | ICD-10-CM

## 2018-10-14 DIAGNOSIS — R0602 Shortness of breath: Secondary | ICD-10-CM

## 2018-10-14 LAB — POCT GLYCOSYLATED HEMOGLOBIN (HGB A1C): Hemoglobin A1C: 6.8 % — AB (ref 4.0–5.6)

## 2018-10-14 MED ORDER — ALBUTEROL SULFATE HFA 108 (90 BASE) MCG/ACT IN AERS: INHALATION_SPRAY | RESPIRATORY_TRACT | 3 refills | Status: AC

## 2018-10-14 NOTE — Patient Instructions (Addendum)
.   Please review the attached list of medications and notify my office if there are any errors.   . Please bring all of your medications to every appointment so we can make sure that our medication list is the same as yours.   . We will have flu vaccines available after Labor Day. Please go to your pharmacy or call the office in early September to schedule you flu shot.  . Please go to the lab draw station in [address] on the second floor of [facility]  when you are fasting for 8 hours. Normal hours are 8:00am to 12:30pm and 1:30pm to 4:00pm Monday through Friday   

## 2018-10-14 NOTE — Progress Notes (Signed)
Patient: Jeremiah Gibson Male    DOB: October 17, 1935   83 y.o.   MRN: [id number] Visit Date: 10/14/2018  Today's Provider: Mila Merryonald Fisher, MD   Chief Complaint  Patient presents with  . Hypertension  . Diabetes  . Hyperlipidemia   Subjective:     HPI    Diabetes Mellitus Type II, Follow-up:   Lab Results  Component Value Date   HGBA1C 6.7 (A) 11/26/2017   HGBA1C 7.0 07/23/2017   HGBA1C 6.7 03/27/2017   Last seen for diabetes 10 months ago.  Management since then includes no changes. He reports excellent compliance with treatment. He is not having side effects.  Current symptoms include none and have been stable. Home blood sugar records: fasting range: 120's  Episodes of hypoglycemia? no   Current Insulin Regimen: None Most Recent Eye Exam: 11/02/2017 Weight trend: stable Prior visit with dietician: no Current diet: in general, a "healthy" diet   Current exercise: walking  ------------------------------------------------------------------------   Hypertension, follow-up:  BP Readings from Last 3 Encounters:  10/14/18 118/68  09/10/18 126/68  04/24/18 124/72    He was last seen for hypertension 10 months ago.  BP at that visit was 120/68. Management since that visit includes no changes He reports excellent compliance with treatment. He is not having side effects.  He is exercising. He is adherent to low salt diet.   Outside blood pressures are no being checked. He is experiencing none.  Patient denies chest pain, fatigue and palpitations.   Cardiovascular risk factors include advanced age (older than 6855 for men, 6765 for women), diabetes mellitus, dyslipidemia, hypertension and male gender.  Use of agents associated with hypertension: none.   ------------------------------------------------------------------------    Lipid/Cholesterol, Follow-up:   Last seen for this 10 months ago.  Management since that visit includes no changes.  Last Lipid  Panel:    Component Value Date/Time   CHOL 146 11/26/2017 0849   TRIG 104 11/26/2017 0849   HDL 45 11/26/2017 0849   CHOLHDL 3.2 11/26/2017 0849   CHOLHDL 3.5 01/30/2017 1042   LDLCALC 80 11/26/2017 0849   LDLCALC 90 01/30/2017 1042    He reports excellent compliance with treatment. He is not having side effects.   Wt Readings from Last 3 Encounters:  10/14/18 193 lb (87.5 kg)  09/10/18 190 lb 3.2 oz (86.3 kg)  04/24/18 195 lb (88.5 kg)    ------------------------------------------------------------------------    No Known Allergies   Current Outpatient Medications:  .  albuterol (VENTOLIN HFA) 108 (90 Base) MCG/ACT inhaler, INHALE 2 PUFFS BY MOUTH INTO THE LUNGS EVERY 4 HOURS AS NEEDED FOR WHEEZING OR SHORTNESS OF BREATH, Disp: 54 g, Rfl: 1 .  allopurinol (ZYLOPRIM) 100 MG tablet, TAKE 2 TABLETS BY MOUTH TWICE DAILY. GENERIC EQUIVALENT FOR ZYLOPRIM, Disp: 360 tablet, Rfl: 3 .  amLODipine (NORVASC) 2.5 MG tablet, Take 1 tablet (2.5 mg total) by mouth daily., Disp: 90 tablet, Rfl: 4 .  aspirin 81 MG tablet, Take 81 mg by mouth every evening. , Disp: , Rfl:  .  cetirizine (ZYRTEC ALLERGY) 10 MG tablet, Take 1 tablet (10 mg total) by mouth daily., Disp: 30 tablet, Rfl: 0 .  clopidogrel (PLAVIX) 75 MG tablet, TAKE 1 TABLET BY MOUTH DAILY GENERIC EQUIVALENT FOR PLAVIX, Disp: 90 tablet, Rfl: 1 .  dicyclomine (BENTYL) 10 MG capsule, Take by mouth., Disp: , Rfl:  .  finasteride (PROSCAR) 5 MG tablet, TAKE 1 TABLET BY MOUTH EVERY DAY. GENERIC EQUIVALENT FOR PROSCAR,  Disp: 90 tablet, Rfl: 0 .  fluticasone (FLONASE) 50 MCG/ACT nasal spray, Place 2 sprays into both nostrils daily., Disp: 16 g, Rfl: 1 .  Fluticasone-Salmeterol (ADVAIR DISKUS) 250-50 MCG/DOSE AEPB, Inhale 1 puff into the lungs 2 (two) times daily., Disp: 3 each, Rfl: 5 .  glipiZIDE (GLUCOTROL XL) 5 MG 24 hr tablet, TAKE 1 TABLET BY MOUTH DAILY WITH BREAKFAST, Disp: 90 tablet, Rfl: 4 .  glucose blood (ONE TOUCH ULTRA TEST)  test strip, Use as instructed to check blood sugar daily. Dx: E11.21, Disp: 100 each, Rfl: 4 .  lipase/protease/amylase (CREON) 12000 units CPEP capsule, TAKE 3 CAPSULES BY MOUTH THREE TIMES DAILY WITH MEALS. TAKE 1 CAPSULES WITH SNACKS, Disp: , Rfl:  .  losartan (COZAAR) 100 MG tablet, TAKE 1 TABLET BY MOUTH EVERY DAY FOR BLOOD PRESSURE, Disp: 90 tablet, Rfl: 1 .  Magnesium 400 MG TABS, Take 400 mg by mouth 2 (two) times daily. , Disp: , Rfl:  .  metFORMIN (GLUCOPHAGE) 500 MG tablet, TAKE 2 TABLETS BY MOUTH TWICE DAILY WITH A MEAL (Patient taking differently: 500 mg. 1 tablet in the morning and 2 in the evening.), Disp: 360 tablet, Rfl: 4 .  MULTIPLE VITAMIN PO, MULTIVITAMINS (Oral Tablet)  1 Every Day for 0 days  Quantity: 0.00;  Refills: 0   Ordered :06-Jan-2010  Doy Hutching ;  Started 22-Mar-2009 Active, Disp: , Rfl:  .  Nintedanib (OFEV) 100 MG CAPS, Take 1 capsule (100 mg total) by mouth 2 (two) times daily. (Patient taking differently: Take 100 mg by mouth daily. ), Disp: 60 capsule, Rfl: 5 .  pantoprazole (PROTONIX) 20 MG tablet, Take 20 mg by mouth 2 (two) times daily., Disp: , Rfl:  .  potassium chloride SA (K-DUR) 20 MEQ tablet, TAKE 1 TABLET BY MOUTH DAILY, Disp: 90 tablet, Rfl: 4 .  pravastatin (PRAVACHOL) 40 MG tablet, TAKE 1 TABLET BY MOUTH DAILY, Disp: 90 tablet, Rfl: 4 .  terazosin (HYTRIN) 5 MG capsule, TAKE 1 CAPSULE BY MOUTH EVERY DAY FOR PROSTATE, Disp: 90 capsule, Rfl: 0 .  triamterene-hydrochlorothiazide (MAXZIDE-25) 37.5-25 MG tablet, TAKE 1 TABLET BY MOUTH EVERY DAY FOR BLOOD PRESSURE GENERIC EQUIVALENT FOR MAXZIDE-25, Disp: 90 tablet, Rfl: 4 .  fish oil-omega-3 fatty acids 1000 MG capsule, Take 1 g by mouth 2 (two) times daily. , Disp: , Rfl:  .  polyethylene glycol-electrolytes (NULYTELY/GOLYTELY) 420 g solution, Take 4,000 mLs by mouth once., Disp: , Rfl:   Review of Systems  Constitutional: Negative.   Respiratory: Negative.   Cardiovascular: Negative.    Endocrine: Negative.   Musculoskeletal: Negative.   Neurological: Negative for dizziness, light-headedness and headaches.    Social History   Tobacco Use  . Smoking status: Former Smoker    Packs/day: 2.00    Years: 20.00    Pack years: 40.00    Types: Cigarettes, Pipe    Quit date: 11/22/1999    Years since quitting: 18.9  . Smokeless tobacco: Never Used  . Tobacco comment: quit cigs in 70 and pipe in 01  Substance Use Topics  . Alcohol use: Yes    Comment: socially,none last 24hrs      Objective:   BP 118/68 (BP Location: Right Arm, Patient Position: Sitting, Cuff Size: Normal)   Pulse 64   Temp 98 F (36.7 C) (Oral)   Wt 193 lb (87.5 kg)   BMI 27.30 kg/m  Vitals:   10/14/18 0959  BP: 118/68  Pulse: 64  Temp: 98 F (36.7 C)  TempSrc: Oral  Weight: 193 lb (87.5 kg)     Physical Exam   General Appearance:    Alert, cooperative, no distress  Eyes:    PERRL, conjunctiva/corneas clear, EOM's intact       Lungs:     Clear to auscultation bilaterally, respirations unlabored  Heart:    Normal heart rate. Normal rhythm. No murmurs, rubs, or gallops.   Neurologic:   Awake, alert, oriented x 3. No apparent focal neurological           defect.       Results for orders placed or performed in visit on 10/14/18  POCT glycosylated hemoglobin (Hb A1C)  Result Value Ref Range   Hemoglobin A1C 6.8 (A) 4.0 - 5.6 %       Assessment & Plan    1. Coronary artery disease involving native heart without angina pectoris, unspecified vessel or lesion type Asymptomatic. Compliant with medication.  Continue aggressive risk factor modification.   - Lipid panel  2. Essential hypertension He is tolerating pravastatin well with no adverse effects.    3. Diabetes mellitus with nephropathy (HCC)   4. Anemia, unspecified type Stable, followed by Dr. Garvin FilaAlvez  5. Shortness of breath refill- albuterol (VENTOLIN HFA) 108 (90 Base) MCG/ACT inhaler; INHALE 2 PUFFS BY MOUTH INTO THE  LUNGS EVERY 4 HOURS AS NEEDED FOR WHEEZING OR SHORTNESS OF BREATH  Dispense: 54 g; Refill: 3       Mila Merryonald Fisher, MD  [facility]

## 2018-10-14 NOTE — Telephone Encounter (Signed)
Spoke with patient. He stated that he is still waiting on the approval for his Ofev. [employer] is still waiting on the PA. I advised him that per the documentation in his chart, the PA was approved on 7/31 but no one was able to reach the pharmacy. Advised him that I would see if I could reach someone with [employer] and then call him back. He verbalized understanding.   Spoke with Ivin Booty at [facility]. She was able to run a test claim, claim came back as paid. She will contact the patient to get the shipment setup.   Left detailed message for patient explaining that [employer] will be contacting him soon to setup a shipment date. Will leave this encounter open for a few days in case he calls back with further questions.

## 2018-10-15 LAB — LIPID PANEL
Chol/HDL Ratio: 3.9 ratio (ref 0.0–5.0)
Cholesterol, Total: 167 mg/dL (ref 100–199)
HDL: 43 mg/dL (ref >39)
LDL Calculated: 99 mg/dL (ref 0–99)
Triglycerides: 124 mg/dL (ref 0–149)
VLDL Cholesterol Cal: 25 mg/dL (ref 5–40)

## 2018-10-16 ENCOUNTER — Telehealth: Payer: Self-pay

## 2018-10-16 NOTE — Telephone Encounter (Signed)
LMTCB 10/16/2018

## 2018-10-16 NOTE — Telephone Encounter (Signed)
-----   Message from Birdie Sons, MD sent at 10/15/2018  8:22 AM EDT ----- ldl cholesterol is 99, need to be under 70. Increase pravastatin to 80mg  once a day, #90, rf x 1 and follow up 3-4 months.

## 2018-10-17 ENCOUNTER — Telehealth: Payer: Self-pay

## 2018-10-17 MED ORDER — PRAVASTATIN SODIUM 80 MG PO TABS: 80.0000 mg | ORAL_TABLET | Freq: Every day | ORAL | 1 refills | Status: AC

## 2018-10-17 NOTE — Telephone Encounter (Signed)
Patient notified of lab results. Agreed with medication changes. Will call to schedule follow up.

## 2018-10-21 NOTE — Telephone Encounter (Signed)
Patient advised as below. Patient verbalizes understanding and is in agreement with treatment plan. Patient reports he will call back to schedule fu 3-4 mths.

## 2018-10-26 ENCOUNTER — Other Ambulatory Visit: Payer: Self-pay | Admitting: Family Medicine

## 2018-10-26 DIAGNOSIS — E1121 Type 2 diabetes mellitus with diabetic nephropathy: Secondary | ICD-10-CM

## 2018-11-08 ENCOUNTER — Other Ambulatory Visit: Payer: Self-pay | Admitting: Pulmonary Disease

## 2018-11-08 DIAGNOSIS — J84112 Idiopathic pulmonary fibrosis: Secondary | ICD-10-CM

## 2018-11-19 DIAGNOSIS — K8689 Other specified diseases of pancreas: Secondary | ICD-10-CM | POA: Diagnosis not present

## 2018-11-19 DIAGNOSIS — R197 Diarrhea, unspecified: Secondary | ICD-10-CM | POA: Diagnosis not present

## 2018-11-27 ENCOUNTER — Other Ambulatory Visit: Payer: Self-pay

## 2018-11-27 ENCOUNTER — Ambulatory Visit (INDEPENDENT_AMBULATORY_CARE_PROVIDER_SITE_OTHER): Payer: Medicare Other

## 2018-11-27 DIAGNOSIS — Z23 Encounter for immunization: Secondary | ICD-10-CM | POA: Diagnosis not present

## 2019-01-02 ENCOUNTER — Other Ambulatory Visit: Payer: Self-pay | Admitting: Family Medicine

## 2019-01-02 DIAGNOSIS — E871 Hypo-osmolality and hyponatremia: Secondary | ICD-10-CM

## 2019-01-15 ENCOUNTER — Other Ambulatory Visit: Payer: Self-pay | Admitting: Family Medicine

## 2019-01-15 DIAGNOSIS — N400 Enlarged prostate without lower urinary tract symptoms: Secondary | ICD-10-CM

## 2019-01-21 DIAGNOSIS — E119 Type 2 diabetes mellitus without complications: Secondary | ICD-10-CM | POA: Diagnosis not present

## 2019-01-21 DIAGNOSIS — Z0000 Encounter for general adult medical examination without abnormal findings: Secondary | ICD-10-CM | POA: Diagnosis not present

## 2019-01-21 DIAGNOSIS — Z7289 Other problems related to lifestyle: Secondary | ICD-10-CM | POA: Diagnosis not present

## 2019-01-21 DIAGNOSIS — D649 Anemia, unspecified: Secondary | ICD-10-CM | POA: Diagnosis not present

## 2019-01-21 DIAGNOSIS — Z87891 Personal history of nicotine dependence: Secondary | ICD-10-CM | POA: Diagnosis not present

## 2019-01-21 DIAGNOSIS — I10 Essential (primary) hypertension: Secondary | ICD-10-CM | POA: Diagnosis not present

## 2019-01-24 ENCOUNTER — Other Ambulatory Visit: Payer: Self-pay | Admitting: Pulmonary Disease

## 2019-01-24 DIAGNOSIS — J84112 Idiopathic pulmonary fibrosis: Secondary | ICD-10-CM

## 2019-01-30 DIAGNOSIS — J8410 Pulmonary fibrosis, unspecified: Secondary | ICD-10-CM | POA: Diagnosis not present

## 2019-02-12 ENCOUNTER — Other Ambulatory Visit: Payer: Self-pay | Admitting: Family Medicine

## 2019-02-12 DIAGNOSIS — I10 Essential (primary) hypertension: Secondary | ICD-10-CM

## 2019-02-25 ENCOUNTER — Other Ambulatory Visit: Payer: Self-pay | Admitting: Pulmonary Disease

## 2019-02-25 DIAGNOSIS — J84112 Idiopathic pulmonary fibrosis: Secondary | ICD-10-CM

## 2019-03-09 ENCOUNTER — Other Ambulatory Visit (INDEPENDENT_AMBULATORY_CARE_PROVIDER_SITE_OTHER): Payer: Self-pay | Admitting: Gastroenterology

## 2019-03-10 NOTE — Telephone Encounter (Signed)
Noted. Pharmacy will let the patient know.

## 2019-03-31 DIAGNOSIS — H02834 Dermatochalasis of left upper eyelid: Secondary | ICD-10-CM | POA: Diagnosis not present

## 2019-03-31 DIAGNOSIS — H02832 Dermatochalasis of right lower eyelid: Secondary | ICD-10-CM | POA: Diagnosis not present

## 2019-03-31 DIAGNOSIS — H02831 Dermatochalasis of right upper eyelid: Secondary | ICD-10-CM | POA: Diagnosis not present

## 2019-03-31 DIAGNOSIS — E119 Type 2 diabetes mellitus without complications: Secondary | ICD-10-CM | POA: Diagnosis not present

## 2019-04-11 ENCOUNTER — Other Ambulatory Visit: Payer: Self-pay | Admitting: Pulmonary Disease

## 2019-04-11 DIAGNOSIS — J84112 Idiopathic pulmonary fibrosis: Secondary | ICD-10-CM

## 2019-09-22 IMAGING — DX DG CHEST 2V
2 series · 2 of 2 positions shown · non-contrast
Comparison: 09/12/2016 chest radiograph.

CLINICAL DATA: IPF

EXAM:
CHEST - 2 VIEW

[chest pa]
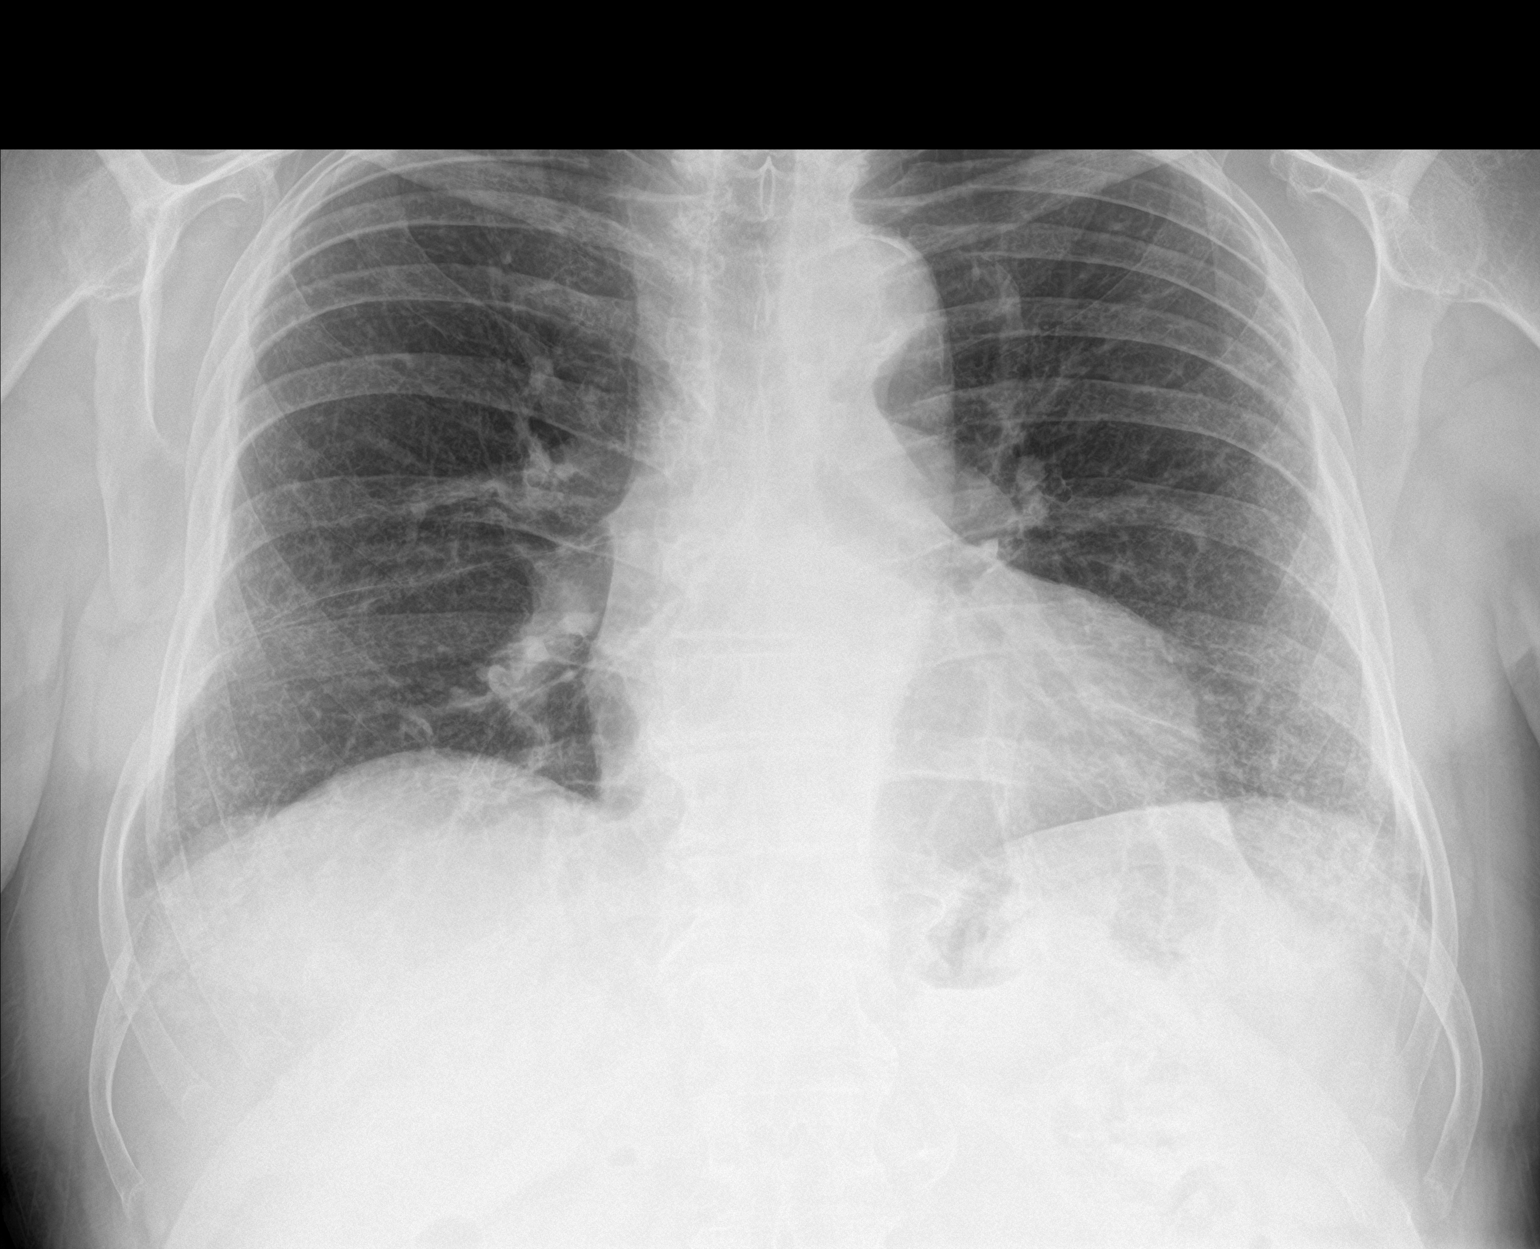

[chest lat]
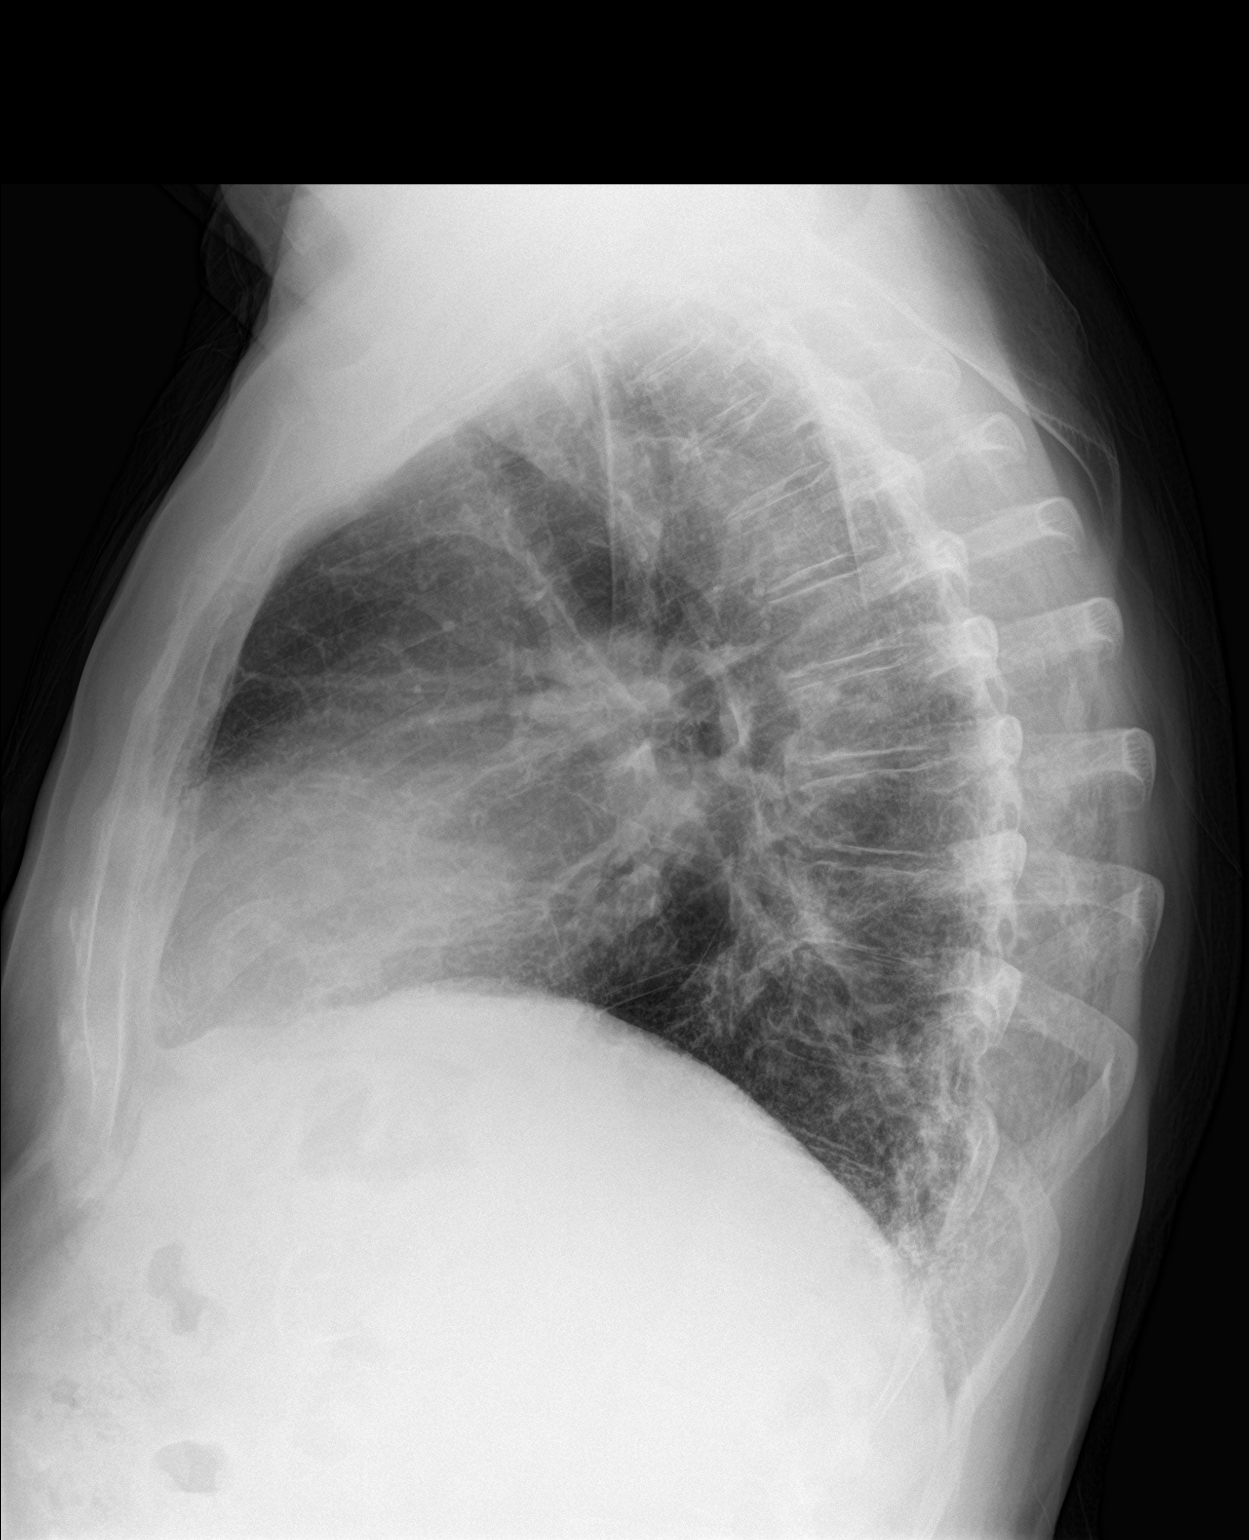

[2 of 2 positions shown; findings below may reference images not displayed]

FINDINGS: Stable cardiomediastinal silhouette with normal heart size. No
pneumothorax. No pleural effusion. No pulmonary edema. No acute
consolidative airspace disease. Peripheral basilar predominant fine
reticular opacities in both lungs, not appreciably changed.
IMPRESSION: Stable chest radiograph with fine peripheral basilar predominant
reticular opacities in the lungs compatible with the provided
history of IPF. No acute cardiopulmonary disease.

## 2020-03-14 ENCOUNTER — Other Ambulatory Visit: Payer: Self-pay | Admitting: Family Medicine

## 2020-03-14 DIAGNOSIS — N400 Enlarged prostate without lower urinary tract symptoms: Secondary | ICD-10-CM

## 2020-03-15 NOTE — Telephone Encounter (Signed)
Requested medication (s) are due for refill today- yes  Requested medication (s) are on the active medication list -yes  Future visit scheduled -no  Last refill: 11/16/19  Notes to clinic: Attempted to call patient to schedule appointment- left message on cell number to call office- RF request has notes- needs appointment- sent for review   Requested Prescriptions  Pending Prescriptions Disp Refills   terazosin (HYTRIN) 5 MG capsule [Pharmacy Med Name: TERAZOSIN 5MG  CAPSULES] 90 capsule 4    Sig: TAKE ONE CAPSULE BY MOUTH DAILY FOR PROSTATE NEED TO SCHEDULE OFFICE VISIT FOR FOLLOW UP      Cardiovascular:  Alpha Blockers Failed - 03/14/2020  1:19 PM      Failed - Valid encounter within last 6 months    Recent Outpatient Visits           1 year ago Coronary artery disease involving native heart without angina pectoris, unspecified vessel or lesion type   [facility], [place]   2 years ago Diabetes mellitus with nephropathy [facility]   [facility], [place]   2 years ago Diabetes mellitus with nephropathy [facility])   [facility], MD   2 years ago Cough   [facility] [place], [place]   2 years ago Diabetes mellitus with nephropathy [facility])   [facility], MD                Passed - Last BP in normal range    BP Readings from Last 1 Encounters:  10/14/18 118/68              Requested Prescriptions  Pending Prescriptions Disp Refills   terazosin (HYTRIN) 5 MG capsule [Pharmacy Med Name: TERAZOSIN 5MG  CAPSULES] 90 capsule 4    Sig: TAKE ONE CAPSULE BY MOUTH DAILY FOR PROSTATE NEED TO SCHEDULE OFFICE VISIT FOR FOLLOW UP      Cardiovascular:  Alpha Blockers Failed - 03/14/2020  1:19 PM      Failed - Valid encounter within last 6 months    Recent Outpatient Visits           1 year ago Coronary artery disease involving native heart without angina  pectoris, unspecified vessel or lesion type   [facility] 05/12/2020, MD   2 years ago Diabetes mellitus with nephropathy [facility])   [facility], [place]   2 years ago Diabetes mellitus with nephropathy [facility])   [facility], [place]   2 years ago Cough   [facility] [place], [facility]   2 years ago Diabetes mellitus with nephropathy [facility])   [facility], [place]                Passed - Last BP in normal range    BP Readings from Last 1 Encounters:  10/14/18 118/68

## 2020-03-18 NOTE — Telephone Encounter (Signed)
Patient would like to cancel the request for a refill

## 2020-05-20 ENCOUNTER — Other Ambulatory Visit: Payer: Self-pay | Admitting: Family Medicine

## 2020-05-20 DIAGNOSIS — R0602 Shortness of breath: Secondary | ICD-10-CM

## 2020-05-20 NOTE — Telephone Encounter (Signed)
Requested medications are due for refill today.  Most likely  Requested medications are on the active medications list.  yes  Last refill. 10/14/2018  Future visit scheduled.   no  Notes to clinic.  Has not been seen for over 1 year.

## 2020-05-20 NOTE — Telephone Encounter (Signed)
   Notes to clinic: script is expired Patient also due for follow up Message left to callback for appt   Requested Prescriptions  Pending Prescriptions Disp Refills   VENTOLIN HFA 108 (90 Base) MCG/ACT inhaler [Pharmacy Med Name: VENTOLIN HFA INH W/DOS CTR 200PUFFS] 54 g 3    Sig: INHALE 2 PUFFS BY MOUTH INTO THE LUNGS EVERY 4 HOURS AS NEEDED FOR WHEEZING OR SHORTNESS OF BREATH      Pulmonology:  Beta Agonists Failed - 05/20/2020  8:15 AM      Failed - One inhaler should last at least one month. If the patient is requesting refills earlier, contact the patient to check for uncontrolled symptoms.      Failed - Valid encounter within last 12 months    Recent Outpatient Visits           1 year ago Coronary artery disease involving native heart without angina pectoris, unspecified vessel or lesion type   [facility] Limes, [place]   2 years ago Diabetes mellitus with nephropathy [facility])   [facility], [place]   2 years ago Diabetes mellitus with nephropathy [facility])   [facility], [place]   2 years ago Cough   [facility] [place], [place]   3 years ago Diabetes mellitus with nephropathy [facility])   [facility] L[facility], [place]

## 2020-05-24 NOTE — Telephone Encounter (Signed)
Left message to call back. OK for [facility] triage to advise patient that he is overdue for follow up appointment and then schedule appointment.

## 2020-05-24 NOTE — Telephone Encounter (Signed)
Per initial encounter from [facility] , "I called pt and pt did not answer. Could not leave VM. PEC may courtesy refill Rx x 1 mo after OV is scheduled"; also see note per [facility], "Notes to clinic: script is expired. Patient also due for follow up. Message left to callback for appt Notes to clinic: script is expired Patient also due for follow up Message left to callback for appt

## 2020-08-30 IMAGING — CT CT ABDOMEN AND PELVIS WITH CONTRAST
2 of 5 series · 16 of 46 positions shown, 18 images · IV contrast (omnipaque)
Comparison: CT scan of December 19, 2010.

CLINICAL DATA: Diarrhea.

EXAM:
CT ABDOMEN AND PELVIS WITH CONTRAST
TECHNIQUE: Multidetector CT imaging of the abdomen and pelvis was performed
using the standard protocol following bolus administration of
intravenous contrast.
CONTRAST:  100mL OMNIPAQUE IOHEXOL 300 MG/ML  SOLN

[Series 2: abd pelvis · axial · 0.80mm/px · z∈[-1480,-1025]mm · 13 of 103 slices shown, 15 images]
[im 6/103  soft-tissue]
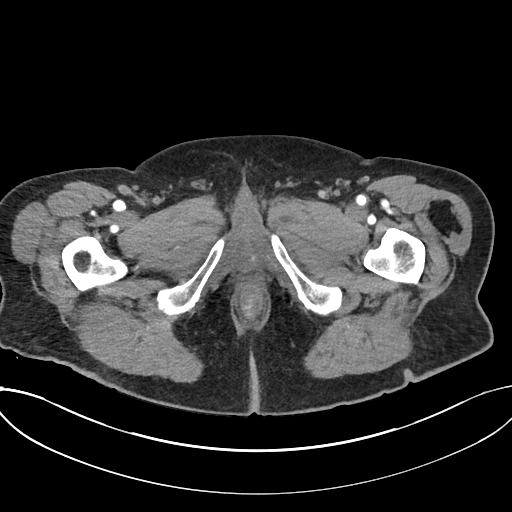
[im 6/103  bone]
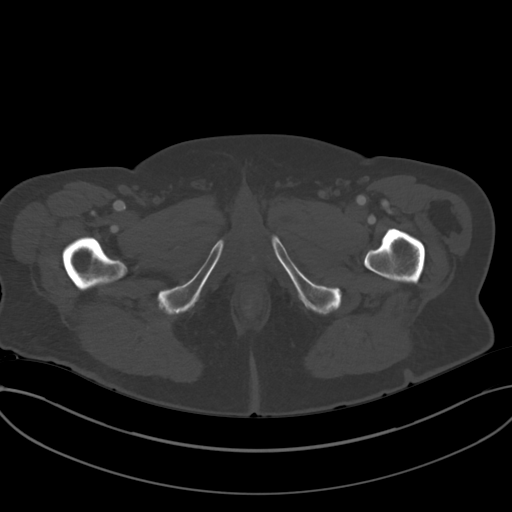
[im 12/103  soft-tissue]
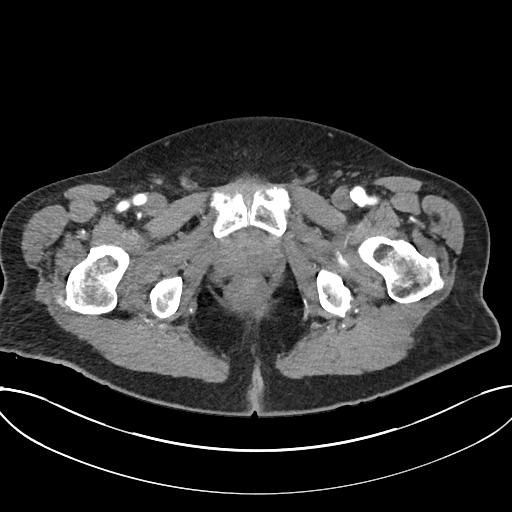
[im 23/103  soft-tissue]
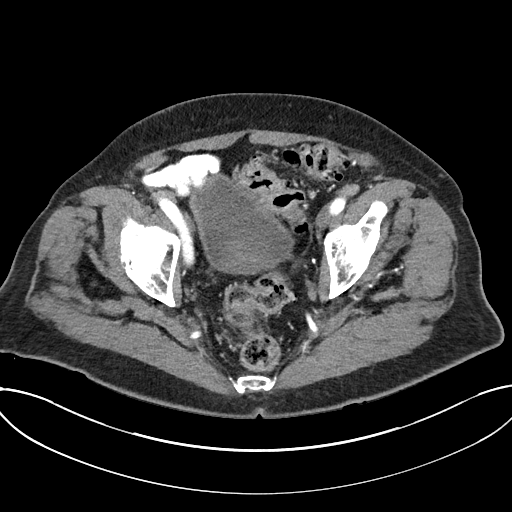
[im 29/103  soft-tissue]
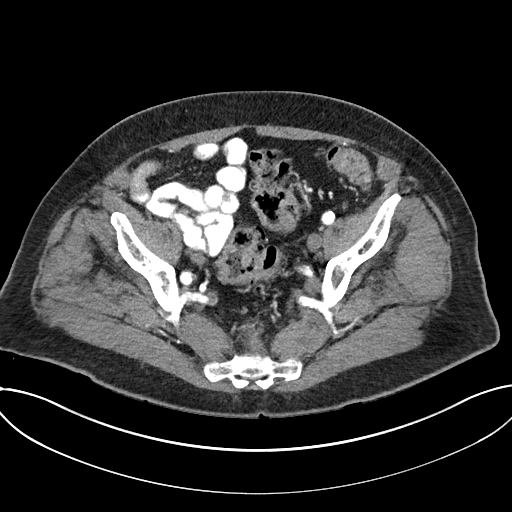
[im 35/103  soft-tissue]
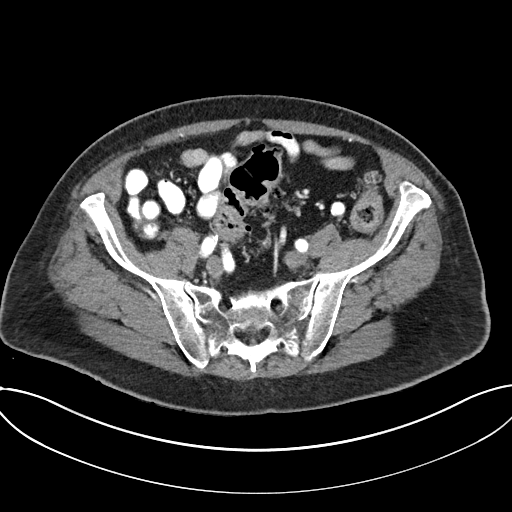
[im 46/103  soft-tissue]
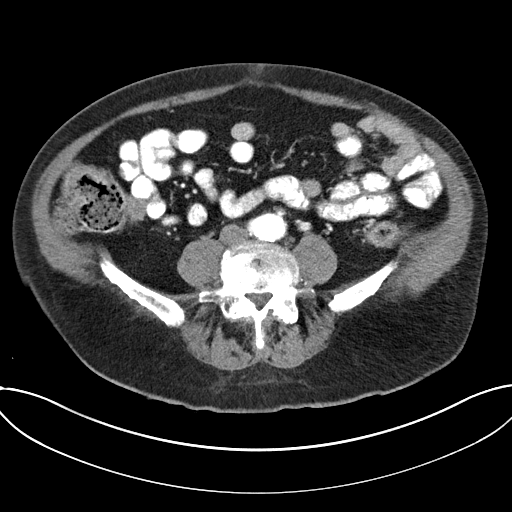
[im 52/103  soft-tissue]
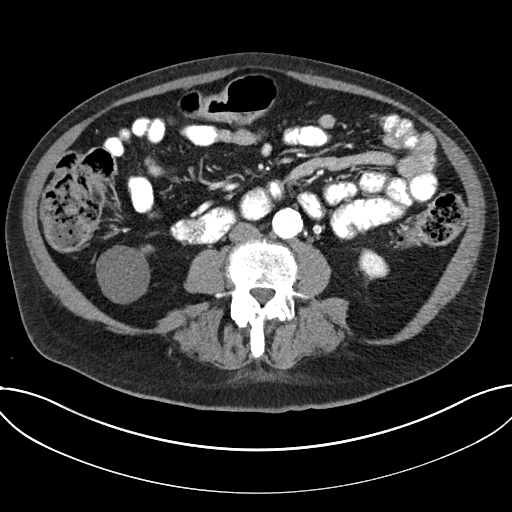
[im 57/103  soft-tissue]
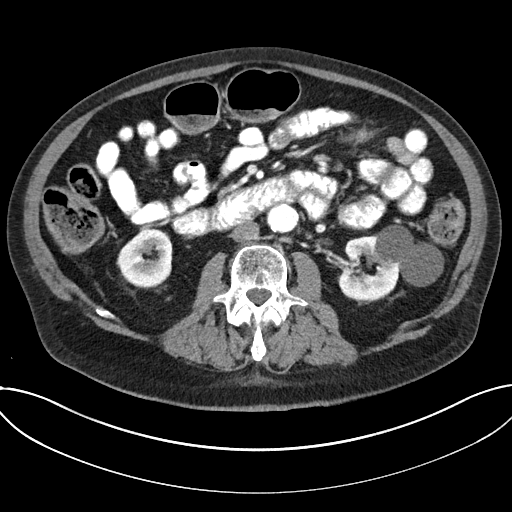
[im 69/103  soft-tissue]
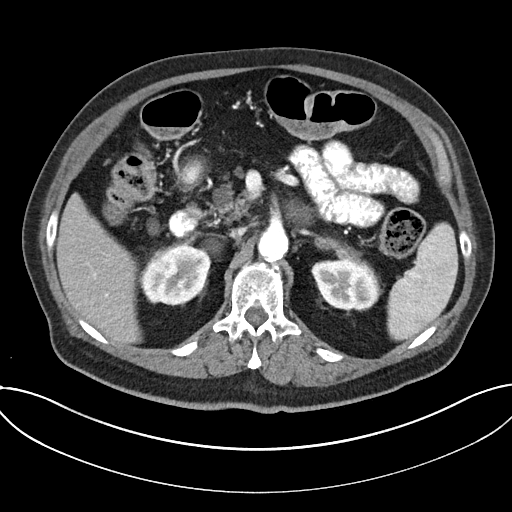
[im 69/103  bone]
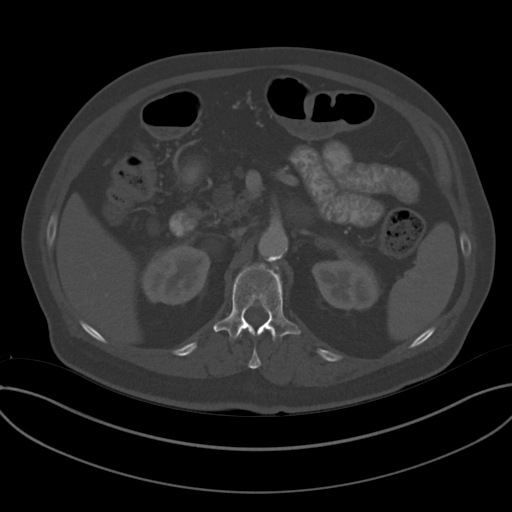
[im 74/103  soft-tissue]
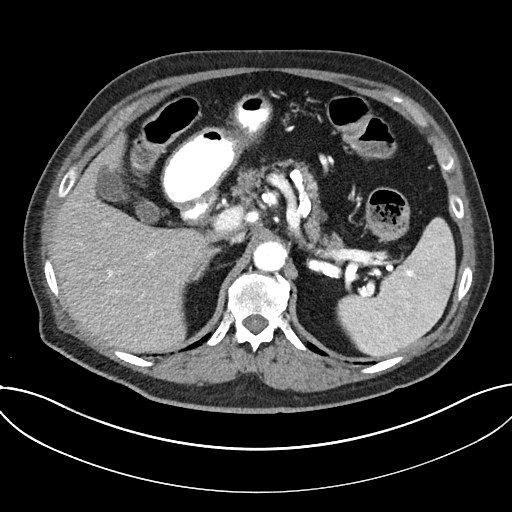
[im 80/103  soft-tissue]
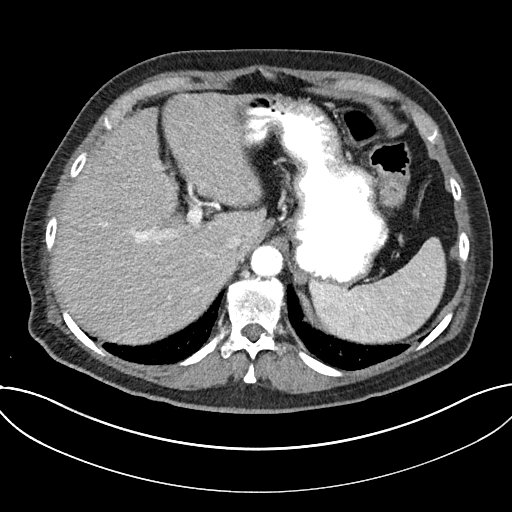
[im 91/103  soft-tissue]
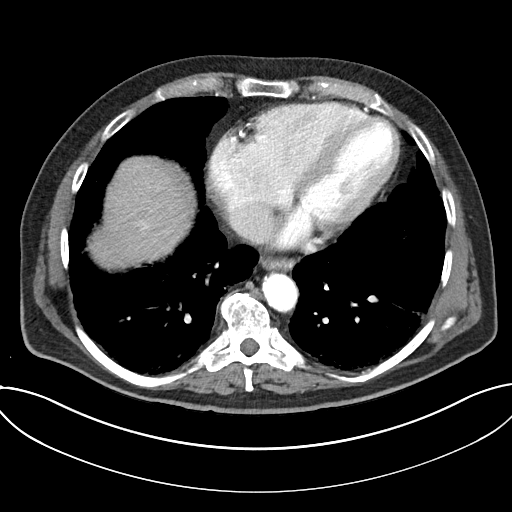
[im 97/103  soft-tissue]
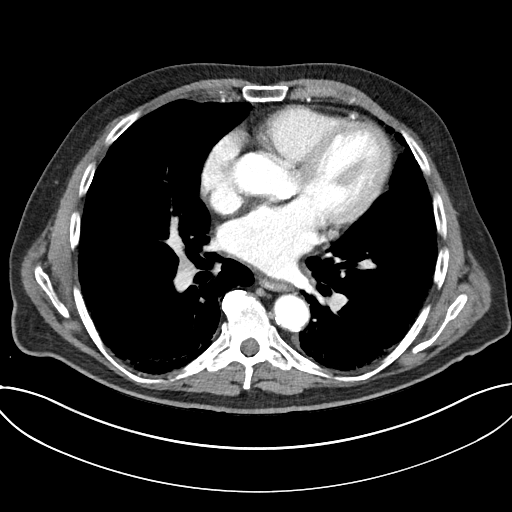

[Series 4: coronal abd pelvis · coronal · 0.80mm/px · 3 of 202 slices shown]
[im 68/202  soft-tissue]
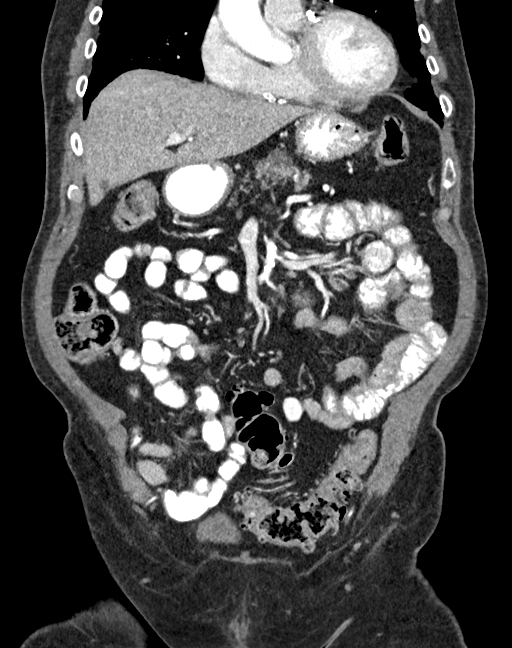
[im 90/202  soft-tissue]
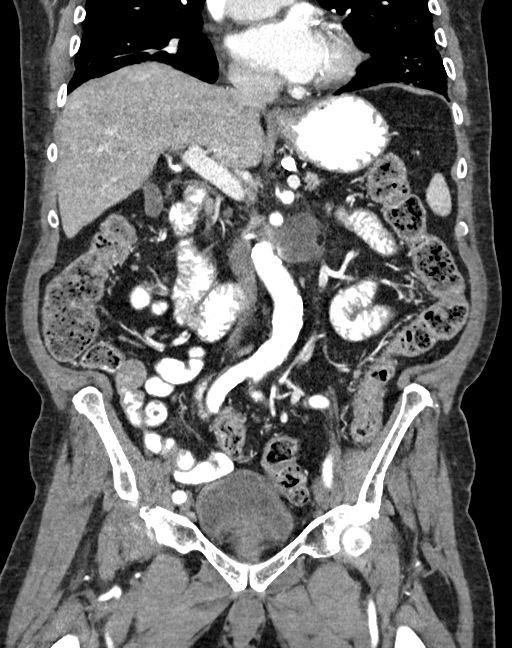
[im 112/202  soft-tissue]
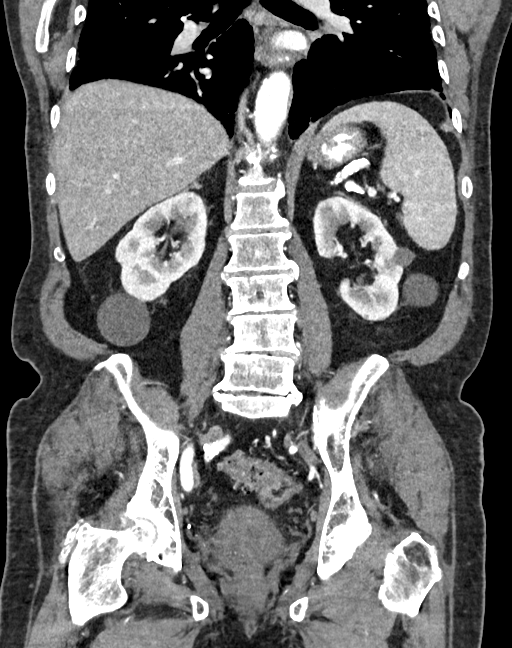

[16 of 46 positions shown; findings below may reference images not displayed]

FINDINGS: Lower chest: No acute abnormality.

Hepatobiliary: No gallstones or biliary dilatation is noted. Hepatic
steatosis is noted.

Pancreas: Unremarkable. No pancreatic ductal dilatation or
surrounding inflammatory changes.

Spleen: Normal in size without focal abnormality.

Adrenals/Urinary Tract: Adrenal glands appear normal. Bilateral
renal cysts are noted. No hydronephrosis or renal obstruction is
noted. No renal or ureteral calculi are noted. Urinary bladder is
unremarkable.

Stomach/Bowel: The stomach appears normal. Sigmoid diverticulosis is
noted without inflammation. Status post appendectomy. No evidence of
bowel obstruction.

Vascular/Lymphatic: Aortic atherosclerosis. No enlarged abdominal or
pelvic lymph nodes.

Reproductive: Moderate prostatic enlargement is noted.

Other: 4.1 x 2.8 cm round low density is noted in left
retroperitoneal region which is decreased compared to prior exam and
most likely represents sequela of prior pancreatic pseudocyst. No
hernia is noted.

Musculoskeletal: No acute or significant osseous findings.
IMPRESSION: 4.1 x 2.8 cm rounded low density is noted in left retroperitoneal
region below the pancreas, which is decreased in size compared to
prior exam and most likely represents sequela of prior pancreatic
pseudocyst. No acute pancreatic abnormality is noted currently.

Hepatic steatosis.

Moderate prostatic enlargement.

Aortic Atherosclerosis ([id number]-220.0).

## 2021-01-19 ENCOUNTER — Telehealth: Payer: Self-pay | Admitting: Pulmonary Disease

## 2021-01-19 NOTE — Telephone Encounter (Signed)
During chart reviews noticed patient has not been seen since 2020. Patient has IPF

## 2021-01-21 NOTE — Telephone Encounter (Signed)
ATC patient. No VM available.  

## 2021-01-26 NOTE — Telephone Encounter (Signed)
ATC patient. No VM available.  

## 2021-02-21 NOTE — Telephone Encounter (Signed)
Letter printed and placed in outgoing mail basket.

## 2021-02-21 NOTE — Telephone Encounter (Signed)
ATC patient to offer appt. Received "Number you have dialed has been disconnected" message when calling number on file. Will mail patient a letter to offer appt. And close encounter per protocol after 3 attempts to reach patient.
